# Patient Record
Sex: Male | Born: 1960 | Race: Black or African American | Hispanic: No
Health system: Southern US, Community
[De-identification: ages and names within clinical notes are randomized; demographics above are authoritative.]

## PROBLEM LIST (undated history)

## (undated) DIAGNOSIS — D509 Iron deficiency anemia, unspecified: Secondary | ICD-10-CM

## (undated) DIAGNOSIS — K8689 Other specified diseases of pancreas: Secondary | ICD-10-CM

## (undated) DIAGNOSIS — K860 Alcohol-induced chronic pancreatitis: Secondary | ICD-10-CM

## (undated) DIAGNOSIS — I10 Essential (primary) hypertension: Secondary | ICD-10-CM

## (undated) DIAGNOSIS — A048 Other specified bacterial intestinal infections: Secondary | ICD-10-CM

## (undated) DIAGNOSIS — I728 Aneurysm of other specified arteries: Principal | ICD-10-CM

## (undated) DIAGNOSIS — M25512 Pain in left shoulder: Secondary | ICD-10-CM

## (undated) DIAGNOSIS — E119 Type 2 diabetes mellitus without complications: Secondary | ICD-10-CM

## (undated) HISTORY — DX: Alcohol-induced chronic pancreatitis: K86.0

## (undated) HISTORY — DX: Iron deficiency anemia, unspecified: D50.9

## (undated) HISTORY — DX: Other specified diseases of pancreas: K86.89

## (undated) HISTORY — DX: Aneurysm of other specified arteries: I72.8

## (undated) HISTORY — DX: Other specified bacterial intestinal infections: A04.8

---

## 1983-11-05 HISTORY — PX: KNEE SURGERY: SHX244

## 2009-12-06 ENCOUNTER — Emergency Department (HOSPITAL_COMMUNITY): Admission: EM | Admit: 2009-12-06 | Discharge: 2009-12-06 | Payer: Self-pay | Admitting: Emergency Medicine

## 2014-11-04 DIAGNOSIS — I10 Essential (primary) hypertension: Secondary | ICD-10-CM | POA: Insufficient documentation

## 2014-11-04 HISTORY — DX: Essential (primary) hypertension: I10

## 2017-01-16 ENCOUNTER — Emergency Department (HOSPITAL_COMMUNITY)
Admission: EM | Admit: 2017-01-16 | Discharge: 2017-01-16 | Disposition: A | Discharge status: Home / Self Care | Payer: No Typology Code available for payment source | Attending: Emergency Medicine | Admitting: Emergency Medicine

## 2017-01-16 ENCOUNTER — Emergency Department (HOSPITAL_COMMUNITY): Payer: No Typology Code available for payment source

## 2017-01-16 ENCOUNTER — Encounter (HOSPITAL_COMMUNITY): Payer: Self-pay | Admitting: Emergency Medicine

## 2017-01-16 DIAGNOSIS — R42 Dizziness and giddiness: Secondary | ICD-10-CM | POA: Diagnosis present

## 2017-01-16 DIAGNOSIS — R002 Palpitations: Secondary | ICD-10-CM | POA: Insufficient documentation

## 2017-01-16 DIAGNOSIS — I10 Essential (primary) hypertension: Secondary | ICD-10-CM | POA: Insufficient documentation

## 2017-01-16 DIAGNOSIS — R55 Syncope and collapse: Secondary | ICD-10-CM

## 2017-01-16 HISTORY — DX: Essential (primary) hypertension: I10

## 2017-01-16 LAB — BASIC METABOLIC PANEL
Anion gap: 12 (ref 5–15)
BUN: 12 mg/dL (ref 6–20)
CHLORIDE: 105 mmol/L (ref 101–111)
CO2: 21 mmol/L — ABNORMAL LOW (ref 22–32)
CREATININE: 0.97 mg/dL (ref 0.61–1.24)
Calcium: 9 mg/dL (ref 8.9–10.3)
Glucose, Bld: 144 mg/dL — ABNORMAL HIGH (ref 65–99)
Potassium: 4 mmol/L (ref 3.5–5.1)
SODIUM: 138 mmol/L (ref 135–145)

## 2017-01-16 LAB — CBC
HCT: 42.8 % (ref 39.0–52.0)
Hemoglobin: 14.4 g/dL (ref 13.0–17.0)
MCH: 26.8 pg (ref 26.0–34.0)
MCHC: 33.6 g/dL (ref 30.0–36.0)
MCV: 79.7 fL (ref 78.0–100.0)
PLATELETS: 338 10*3/uL (ref 150–400)
RBC: 5.37 MIL/uL (ref 4.22–5.81)
RDW: 15 % (ref 11.5–15.5)
WBC: 4 10*3/uL (ref 4.0–10.5)

## 2017-01-16 LAB — URINALYSIS, ROUTINE W REFLEX MICROSCOPIC
Bilirubin Urine: NEGATIVE
GLUCOSE, UA: NEGATIVE mg/dL
Hgb urine dipstick: NEGATIVE
Ketones, ur: NEGATIVE mg/dL
LEUKOCYTES UA: NEGATIVE
NITRITE: NEGATIVE
Protein, ur: NEGATIVE mg/dL
Specific Gravity, Urine: 1.019 (ref 1.005–1.030)
pH: 6 (ref 5.0–8.0)

## 2017-01-16 LAB — I-STAT TROPONIN, ED: Troponin i, poc: 0.01 ng/mL (ref 0.00–0.08)

## 2017-01-16 LAB — CBG MONITORING, ED: GLUCOSE-CAPILLARY: 157 mg/dL — AB (ref 65–99)

## 2017-01-16 NOTE — ED Triage Notes (Addendum)
Pt reports dizziness that comes and goes for the past few months, reports PCP sent him for further evaluation. Reports hx of htn, and noncompliance with bp meds. Pt ambulatory to triage, speech clear, resp e/u, nad. No neuro deficits noted.

## 2017-01-16 NOTE — ED Notes (Signed)
cbg was 157

## 2017-01-16 NOTE — ED Notes (Signed)
Patient transported to X-ray 

## 2017-01-16 NOTE — ED Notes (Signed)
Pt ambulated to restroom, pt had no complaints and walked with a steady gait.

## 2017-01-17 NOTE — ED Provider Notes (Signed)
[facility] DEPT Provider Note   CSN: [id number] Arrival date & time: 01/16/17  3474     History   Chief Complaint Chief Complaint  Joe [name] presents with  . Dizziness    Joe ANTARIO [name] is a 56 y.o. male.  Joe Joe [name] presents to the emergency room with complaints of intermittent dizziness is been going going over the past several months.  Joe [name] feels like this associated intermittently with palpitations.  Joe [name] does have a history of hypertension has had noncompliance with Joe [name] medications.  No syncope.  No history of coronary artery disease.  Asymptomatic at this time.    Past Medical History:  Diagnosis Date  . Hypertension     There are no active problems to display for this Joe [name].   Past Surgical History:  Procedure Laterality Date  . KNEE SURGERY         Home Medications    Prior to Admission medications   Medication Sig Start Date End Date Taking? Authorizing Provider  amLODipine (NORVASC) 5 MG tablet Take 5 mg by mouth daily.   Yes Historical Provider, MD    Family History No family history on file.  Social History Social History  Substance Use Topics  . Smoking status: Never Smoker  . Smokeless tobacco: Not on file  . Alcohol use No     Allergies   Hctz [hydrochlorothiazide]   Review of Systems Review of Systems  All other systems reviewed and are negative.    Physical Exam Updated Vital Signs BP (!) 137/105   Pulse 69   Temp 97.4 F (36.3 C) (Oral)   Resp 18   SpO2 98%   Physical Exam  Constitutional: Joe [name] is oriented to person, place, and time. Joe [name] appears well-developed and well-nourished.  HENT:  Head: Normocephalic and atraumatic.  Eyes: EOM are normal.  Neck: Normal range of motion.  Cardiovascular: Normal rate, regular rhythm, normal heart sounds and intact distal pulses.   Pulmonary/Chest: Effort normal and breath sounds normal. No respiratory distress.  Abdominal: Soft. Joe [name] exhibits no distension. There is no  tenderness.  Musculoskeletal: Normal range of motion.  Neurological: Joe [name] is alert and oriented to person, place, and time.  Skin: Skin is warm and dry.  Psychiatric: Joe [name] has a normal mood and affect. Judgment normal.  Nursing note and vitals reviewed.    ED Treatments / Results  Labs (all labs ordered are listed, but only abnormal results are displayed) Labs Reviewed  BASIC METABOLIC PANEL - Abnormal; Notable for the following:       Result Value   CO2 21 (*)    Glucose, Bld 144 (*)    All other components within normal limits  CBG MONITORING, ED - Abnormal; Notable for the following:    Glucose-Capillary 157 (*)    All other components within normal limits  CBC  URINALYSIS, ROUTINE W REFLEX MICROSCOPIC  I-STAT TROPOININ, ED    EKG  EKG Interpretation  Date/Time:  Thursday January 16 2017 25:95:63 EDT Ventricular Rate:  80 PR Interval:  148 QRS Duration: 90 QT Interval:  384 QTC Calculation: 442 R Axis:   -46 Text Interpretation:  Normal sinus rhythm Biatrial enlargement Left axis deviation Septal infarct , age undetermined Abnormal ECG No old tracing to compare Confirmed by Deija Buhrman  MD, Lennette Bihari ([id number]) on 01/17/2017 3:59:50 PM       Radiology Dg Chest 2 View  Result Date: 01/16/2017 CLINICAL DATA:  Dizziness and nausea. EXAM: CHEST  2 VIEW COMPARISON:  None. FINDINGS:  Lungs are clear. Heart size and pulmonary vascularity are normal. No adenopathy. No bone lesions. IMPRESSION: No edema or consolidation. Electronically Signed   By: Lowella Grip III M.D.   On: 01/16/2017 11:18    Procedures Procedures (including critical care time)  Medications Ordered in ED Medications - No data to display   Initial Impression / Assessment and Plan / ED Course  I have reviewed the triage vital signs and the nursing notes.  Pertinent labs & imaging results that were available during my care of the Joe [name] were reviewed by me and considered in my medical decision making (see chart  for details).     Intermittent lightheadedness for several months.  Joe [name] does have associated palpitations with these.  I have contacted the [facility] and set the Joe [name] up for outpatient 72 hour Holter monitor.  Joe [name] understands return to the ER for new or worsening symptoms.  I've also schedule him an outpatient appointment with cardiology.  I do not think Joe [name] needs acute admission the hospital this time.  Joe [name] symptoms have been intermittent over the past several months.  Final Clinical Impressions(s) / ED Diagnoses   Final diagnoses:  Palpitations  Near syncope    New Prescriptions Discharge Medication List as of 01/16/2017  2:45 PM       Jola Schmidt, MD 01/17/17 [phone]

## 2017-10-10 ENCOUNTER — Encounter (HOSPITAL_COMMUNITY): Payer: Self-pay | Admitting: Emergency Medicine

## 2017-10-10 ENCOUNTER — Other Ambulatory Visit: Payer: Self-pay

## 2017-10-10 DIAGNOSIS — Z5321 Procedure and treatment not carried out due to patient leaving prior to being seen by health care provider: Secondary | ICD-10-CM | POA: Insufficient documentation

## 2017-10-10 DIAGNOSIS — Z041 Encounter for examination and observation following transport accident: Secondary | ICD-10-CM | POA: Insufficient documentation
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
## 2017-10-11 ENCOUNTER — Emergency Department (HOSPITAL_COMMUNITY)
Admission: EM | Admit: 2017-10-11 | Discharge: 2017-10-11 | Disposition: A | Discharge status: Home / Self Care | Payer: No Typology Code available for payment source | Attending: Emergency Medicine | Admitting: Emergency Medicine

## 2017-10-11 NOTE — ED Notes (Signed)
No answer in waiting area for x-ray.

## 2017-10-11 NOTE — ED Notes (Signed)
No answer in waiting area.

## 2018-02-09 ENCOUNTER — Ambulatory Visit: Payer: Self-pay | Admitting: Internal Medicine

## 2018-02-18 ENCOUNTER — Encounter: Payer: Self-pay | Admitting: Internal Medicine

## 2018-02-18 ENCOUNTER — Ambulatory Visit: Payer: Self-pay | Admitting: Internal Medicine

## 2018-02-18 VITALS — BP 138/88 | HR 68 | Resp 12 | Ht 69.0 in | Wt 160.0 lb

## 2018-02-18 DIAGNOSIS — I10 Essential (primary) hypertension: Secondary | ICD-10-CM

## 2018-02-18 DIAGNOSIS — F4321 Adjustment disorder with depressed mood: Secondary | ICD-10-CM

## 2018-02-18 MED ORDER — LOSARTAN POTASSIUM-HCTZ 100-12.5 MG PO TABS: 1.0000 | ORAL_TABLET | Freq: Every day | ORAL | 3 refills | Status: DC

## 2018-02-18 NOTE — Patient Instructions (Signed)
Can google "advance directives, [place]"  And bring up form from Secretary of State. Print and fill out Or can go to "5 wishes"  Which is also in Spanish and fill out--this costs $5--perhaps easier to use. Designate a Medical Power of Attorney to speak for you if you are unable to speak for yourself when ill or injured  
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
## 2018-02-18 NOTE — Progress Notes (Signed)
Social Work Theatre manager completed new patient screening with Joe [name] to assess for any behavioral health or resource needs. Joe [name] reported that he feels down several days, and is also experiencing restlessness, stress, and anxiety and that he was particularly stressed about coming for his appointment today. He reported feeling particularly anxious that several of his family members "have left him" in the past year. SW Intern offered information about counseling services at [facility] and encouraged Joe [name] to consider how this may be beneficial for exploring his stress, anxiety, and family situations further. Joe [name] did not report any resource needs at this time. SWI gave contact information if Joe [name] desired to set up counseling services in the future.

## 2018-03-03 ENCOUNTER — Ambulatory Visit: Payer: Self-pay | Admitting: Internal Medicine

## 2018-03-24 ENCOUNTER — Other Ambulatory Visit: Payer: Self-pay

## 2018-05-11 ENCOUNTER — Telehealth: Payer: Self-pay | Admitting: Internal Medicine

## 2018-05-11 NOTE — Telephone Encounter (Signed)
Lab appointment scheduled for 05/22/18.

## 2018-05-22 ENCOUNTER — Other Ambulatory Visit: Payer: Self-pay

## 2018-05-26 ENCOUNTER — Encounter: Payer: Self-pay | Admitting: Internal Medicine

## 2018-07-08 ENCOUNTER — Other Ambulatory Visit: Payer: Self-pay

## 2018-07-08 ENCOUNTER — Encounter: Payer: Self-pay | Admitting: Internal Medicine

## 2018-07-08 ENCOUNTER — Ambulatory Visit: Payer: Self-pay | Admitting: Internal Medicine

## 2018-07-08 VITALS — BP 140/92 | HR 74 | Resp 12 | Ht 69.0 in | Wt 166.0 lb

## 2018-07-08 DIAGNOSIS — I10 Essential (primary) hypertension: Secondary | ICD-10-CM

## 2018-07-08 DIAGNOSIS — M5442 Lumbago with sciatica, left side: Secondary | ICD-10-CM

## 2018-07-08 DIAGNOSIS — Z23 Encounter for immunization: Secondary | ICD-10-CM

## 2018-07-08 DIAGNOSIS — Z125 Encounter for screening for malignant neoplasm of prostate: Secondary | ICD-10-CM

## 2018-07-08 DIAGNOSIS — G8929 Other chronic pain: Secondary | ICD-10-CM | POA: Insufficient documentation

## 2018-07-08 DIAGNOSIS — Z79899 Other long term (current) drug therapy: Secondary | ICD-10-CM

## 2018-07-08 DIAGNOSIS — Z13220 Encounter for screening for lipoid disorders: Secondary | ICD-10-CM

## 2018-07-08 MED ORDER — CYCLOBENZAPRINE HCL 10 MG PO TABS: ORAL_TABLET | ORAL | 1 refills | Status: DC

## 2018-07-08 MED ORDER — METHYLPREDNISOLONE ACETATE 40 MG/ML IJ SUSP
80.0000 mg | Freq: Once | INTRAMUSCULAR | Status: AC
  Administered 2018-07-08: 80 mg via INTRAMUSCULAR

## 2018-07-08 NOTE — Addendum Note (Signed)
Addended bySerafina Mitchell on: 07/08/2018 04:02 PM   Modules accepted: Orders

## 2018-07-08 NOTE — Progress Notes (Signed)
   Subjective:    Patient ID: Joe [name], male    DOB: Oct 05, 1961, 57 y.o.   MRN: [id number]  HPI   Left hip and side pain for 3 1/2 months:  Was working at the time--a lot of bending and relatively mild to moderate lifting.   Went to [facility] on [address] about 2 months ago as could no longer stand the pain. He had Xrays done.  Sounds like he had xrays of hip and back.  States he was told these were fine. States he was given diagnosis of left sciatica and corticosteroid injection along with muscle relaxant and pain med. He states the treatment helped a little while and then just seemed to come and go.   In past 2 weeks, he has had difficulties with pain every day again. Pain is constant.  Describes the pain as aching.  Starts in posterior mid buttock and radiates down side of leg.  Describes radiation of pain as a soreness.   If sitting or lying down, the pain is not there--only if standing and walking.   Has more pain if walking up stairs or a hill. Going downstairs or downhill does not hurt as much. No urine or bowel incontinence.   Not clear if foot drop.  Likely not by his description. Out of medication for about 1 week.  2.  Hypertension:  Did not take medication today as had fasting labs this morning and did not know if he could drink water.  Discussed never to hold bp med unless we tell him specifically to do so.  Current Meds  Medication Sig  . losartan-hydrochlorothiazide (HYZAAR) 100-12.5 MG tablet Take 1 tablet by mouth daily.   Allergies  Allergen Reactions  . Hctz [Hydrochlorothiazide] Rash    Review of Systems    Objective:   Physical Exam NAD Significant varus angulation of knees bilaterally as walks to exam table. MS:  Mild tenderness over L/S spinous processes.  Mild tenderness over left paraspinous musculature.   +/- straight leg raise. Neuro:  Motor 5/5, DTRs 2+/4 throughout bilateral lower extremities.  Sensation to light touch normal and  equal bilaterally.       Assessment & Plan:  Left low back pain with sciatica:  80 mg IM Depo Medrol Cyclobenzaprine 10 mg 1/2 to 1 tab every 8 hours as needed. Referral to [facility]  Hypertension:  To not skip meds unless specifically told to.

## 2018-07-08 NOTE — Patient Instructions (Signed)
[facility]:  [phone]  

## 2018-07-09 LAB — COMPREHENSIVE METABOLIC PANEL
A/G RATIO: 1.3 (ref 1.2–2.2)
ALBUMIN: 3.4 g/dL — AB (ref 3.5–5.5)
ALT: 28 IU/L (ref 0–44)
AST: 42 IU/L — AB (ref 0–40)
Alkaline Phosphatase: 126 IU/L — ABNORMAL HIGH (ref 39–117)
BUN / CREAT RATIO: 11 (ref 9–20)
BUN: 9 mg/dL (ref 6–24)
Bilirubin Total: 0.8 mg/dL (ref 0.0–1.2)
CALCIUM: 8.9 mg/dL (ref 8.7–10.2)
CO2: 25 mmol/L (ref 20–29)
CREATININE: 0.79 mg/dL (ref 0.76–1.27)
Chloride: 105 mmol/L (ref 96–106)
GFR, EST AFRICAN AMERICAN: 115 mL/min/{1.73_m2} (ref >59)
GFR, EST NON AFRICAN AMERICAN: 100 mL/min/{1.73_m2} (ref >59)
GLOBULIN, TOTAL: 2.6 g/dL (ref 1.5–4.5)
Glucose: 86 mg/dL (ref 65–99)
POTASSIUM: 4.6 mmol/L (ref 3.5–5.2)
SODIUM: 144 mmol/L (ref 134–144)
TOTAL PROTEIN: 6 g/dL (ref 6.0–8.5)

## 2018-07-09 LAB — CBC WITH DIFFERENTIAL/PLATELET
BASOS: 0 %
Basophils Absolute: 0 10*3/uL (ref 0.0–0.2)
EOS (ABSOLUTE): 0.3 10*3/uL (ref 0.0–0.4)
EOS: 8 %
HEMATOCRIT: 36.4 % — AB (ref 37.5–51.0)
HEMOGLOBIN: 11.9 g/dL — AB (ref 13.0–17.7)
IMMATURE GRANULOCYTES: 0 %
Immature Grans (Abs): 0 10*3/uL (ref 0.0–0.1)
Lymphocytes Absolute: 0.9 10*3/uL (ref 0.7–3.1)
Lymphs: 25 %
MCH: 27.2 pg (ref 26.6–33.0)
MCHC: 32.7 g/dL (ref 31.5–35.7)
MCV: 83 fL (ref 79–97)
MONOS ABS: 0.4 10*3/uL (ref 0.1–0.9)
Monocytes: 11 %
NEUTROS PCT: 56 %
Neutrophils Absolute: 2 10*3/uL (ref 1.4–7.0)
Platelets: 420 10*3/uL (ref 150–450)
RBC: 4.37 x10E6/uL (ref 4.14–5.80)
RDW: 15.3 % (ref 12.3–15.4)
WBC: 3.5 10*3/uL (ref 3.4–10.8)

## 2018-07-09 LAB — LIPID PANEL W/O CHOL/HDL RATIO
Cholesterol, Total: 81 mg/dL — ABNORMAL LOW (ref 100–199)
HDL: 46 mg/dL (ref >39)
LDL Calculated: 27 mg/dL (ref 0–99)
TRIGLYCERIDES: 42 mg/dL (ref 0–149)
VLDL Cholesterol Cal: 8 mg/dL (ref 5–40)

## 2018-07-09 LAB — PSA: Prostate Specific Ag, Serum: 3.5 ng/mL (ref 0.0–4.0)

## 2018-07-27 ENCOUNTER — Encounter: Payer: Self-pay | Admitting: Internal Medicine

## 2018-10-07 ENCOUNTER — Ambulatory Visit: Payer: Self-pay | Admitting: Internal Medicine

## 2018-11-05 ENCOUNTER — Other Ambulatory Visit: Payer: Self-pay

## 2018-11-05 ENCOUNTER — Encounter (HOSPITAL_COMMUNITY): Payer: Self-pay | Admitting: Emergency Medicine

## 2018-11-05 ENCOUNTER — Inpatient Hospital Stay (HOSPITAL_COMMUNITY)
Admission: EM | Admit: 2018-11-05 | Discharge: 2018-11-14 | DRG: 356 | Disposition: A | Discharge status: Home / Self Care | Payer: Medicaid Other | Attending: Internal Medicine | Admitting: Internal Medicine

## 2018-11-05 ENCOUNTER — Other Ambulatory Visit (HOSPITAL_COMMUNITY): Payer: No Typology Code available for payment source

## 2018-11-05 ENCOUNTER — Inpatient Hospital Stay (HOSPITAL_COMMUNITY): Payer: Medicaid Other

## 2018-11-05 DIAGNOSIS — H1032 Unspecified acute conjunctivitis, left eye: Secondary | ICD-10-CM | POA: Diagnosis present

## 2018-11-05 DIAGNOSIS — I10 Essential (primary) hypertension: Secondary | ICD-10-CM | POA: Diagnosis present

## 2018-11-05 DIAGNOSIS — F1020 Alcohol dependence, uncomplicated: Secondary | ICD-10-CM | POA: Diagnosis present

## 2018-11-05 DIAGNOSIS — R188 Other ascites: Secondary | ICD-10-CM | POA: Diagnosis present

## 2018-11-05 DIAGNOSIS — R1013 Epigastric pain: Secondary | ICD-10-CM

## 2018-11-05 DIAGNOSIS — Z888 Allergy status to other drugs, medicaments and biological substances status: Secondary | ICD-10-CM | POA: Diagnosis not present

## 2018-11-05 DIAGNOSIS — K859 Acute pancreatitis without necrosis or infection, unspecified: Secondary | ICD-10-CM | POA: Diagnosis present

## 2018-11-05 DIAGNOSIS — K922 Gastrointestinal hemorrhage, unspecified: Secondary | ICD-10-CM | POA: Diagnosis present

## 2018-11-05 DIAGNOSIS — K921 Melena: Principal | ICD-10-CM | POA: Diagnosis present

## 2018-11-05 DIAGNOSIS — E440 Moderate protein-calorie malnutrition: Secondary | ICD-10-CM | POA: Diagnosis present

## 2018-11-05 DIAGNOSIS — Z8249 Family history of ischemic heart disease and other diseases of the circulatory system: Secondary | ICD-10-CM | POA: Diagnosis not present

## 2018-11-05 DIAGNOSIS — K766 Portal hypertension: Secondary | ICD-10-CM | POA: Diagnosis present

## 2018-11-05 DIAGNOSIS — K648 Other hemorrhoids: Secondary | ICD-10-CM | POA: Diagnosis present

## 2018-11-05 DIAGNOSIS — K635 Polyp of colon: Secondary | ICD-10-CM | POA: Diagnosis present

## 2018-11-05 DIAGNOSIS — Z9114 Patient's other noncompliance with medication regimen: Secondary | ICD-10-CM

## 2018-11-05 DIAGNOSIS — I729 Aneurysm of unspecified site: Secondary | ICD-10-CM

## 2018-11-05 DIAGNOSIS — K2971 Gastritis, unspecified, with bleeding: Secondary | ICD-10-CM | POA: Diagnosis present

## 2018-11-05 DIAGNOSIS — C259 Malignant neoplasm of pancreas, unspecified: Secondary | ICD-10-CM | POA: Diagnosis present

## 2018-11-05 DIAGNOSIS — K920 Hematemesis: Secondary | ICD-10-CM | POA: Diagnosis present

## 2018-11-05 DIAGNOSIS — D649 Anemia, unspecified: Secondary | ICD-10-CM

## 2018-11-05 DIAGNOSIS — K861 Other chronic pancreatitis: Secondary | ICD-10-CM | POA: Diagnosis present

## 2018-11-05 DIAGNOSIS — K2900 Acute gastritis without bleeding: Secondary | ICD-10-CM

## 2018-11-05 DIAGNOSIS — D62 Acute posthemorrhagic anemia: Secondary | ICD-10-CM | POA: Diagnosis present

## 2018-11-05 DIAGNOSIS — Z79899 Other long term (current) drug therapy: Secondary | ICD-10-CM

## 2018-11-05 DIAGNOSIS — I471 Supraventricular tachycardia: Secondary | ICD-10-CM | POA: Diagnosis present

## 2018-11-05 DIAGNOSIS — Z7289 Other problems related to lifestyle: Secondary | ICD-10-CM | POA: Diagnosis not present

## 2018-11-05 DIAGNOSIS — D509 Iron deficiency anemia, unspecified: Secondary | ICD-10-CM

## 2018-11-05 DIAGNOSIS — R109 Unspecified abdominal pain: Secondary | ICD-10-CM | POA: Diagnosis present

## 2018-11-05 LAB — COMPREHENSIVE METABOLIC PANEL
ALBUMIN: 3.3 g/dL — AB (ref 3.5–5.0)
ALT: 26 U/L (ref 0–44)
AST: 27 U/L (ref 15–41)
Alkaline Phosphatase: 53 U/L (ref 38–126)
Anion gap: 10 (ref 5–15)
BILIRUBIN TOTAL: 0.9 mg/dL (ref 0.3–1.2)
BUN: 9 mg/dL (ref 6–20)
CALCIUM: 8.1 mg/dL — AB (ref 8.9–10.3)
CO2: 21 mmol/L — AB (ref 22–32)
Chloride: 104 mmol/L (ref 98–111)
Creatinine, Ser: 0.77 mg/dL (ref 0.61–1.24)
GFR calc non Af Amer: >60 mL/min (ref >60)
GLUCOSE: 141 mg/dL — AB (ref 70–99)
POTASSIUM: 3.5 mmol/L (ref 3.5–5.1)
SODIUM: 135 mmol/L (ref 135–145)
TOTAL PROTEIN: 5.4 g/dL — AB (ref 6.5–8.1)

## 2018-11-05 LAB — PREPARE RBC (CROSSMATCH)

## 2018-11-05 LAB — CBC WITH DIFFERENTIAL/PLATELET
Abs Immature Granulocytes: 0.01 10*3/uL (ref 0.00–0.07)
BASOS ABS: 0 10*3/uL (ref 0.0–0.1)
Basophils Relative: 0 %
EOS ABS: 0 10*3/uL (ref 0.0–0.5)
EOS PCT: 0 %
HCT: 23.8 % — ABNORMAL LOW (ref 39.0–52.0)
Hemoglobin: 7 g/dL — ABNORMAL LOW (ref 13.0–17.0)
Immature Granulocytes: 0 %
Lymphocytes Relative: 9 %
Lymphs Abs: 0.5 10*3/uL — ABNORMAL LOW (ref 0.7–4.0)
MCH: 23.6 pg — AB (ref 26.0–34.0)
MCHC: 29.4 g/dL — AB (ref 30.0–36.0)
MCV: 80.4 fL (ref 80.0–100.0)
MONO ABS: 0.3 10*3/uL (ref 0.1–1.0)
Monocytes Relative: 5 %
NRBC: 0 % (ref 0.0–0.2)
Neutro Abs: 4.6 10*3/uL (ref 1.7–7.7)
Neutrophils Relative %: 86 %
Platelets: 251 10*3/uL (ref 150–400)
RBC: 2.96 MIL/uL — AB (ref 4.22–5.81)
RDW: 16.7 % — AB (ref 11.5–15.5)
WBC: 5.4 10*3/uL (ref 4.0–10.5)

## 2018-11-05 LAB — ABO/RH: ABO/RH(D): A POS

## 2018-11-05 LAB — HEMOGLOBIN: Hemoglobin: 8.6 g/dL — ABNORMAL LOW (ref 13.0–17.0)

## 2018-11-05 LAB — POC OCCULT BLOOD, ED: Fecal Occult Bld: POSITIVE — AB

## 2018-11-05 LAB — LIPASE, BLOOD: Lipase: 117 U/L — ABNORMAL HIGH (ref 11–51)

## 2018-11-05 MED ORDER — ACETAMINOPHEN 325 MG PO TABS: 650.0000 mg | ORAL_TABLET | Freq: Four times a day (QID) | ORAL | Status: DC | PRN

## 2018-11-05 MED ORDER — SODIUM CHLORIDE 0.9 % IV BOLUS
1000.0000 mL | Freq: Once | INTRAVENOUS | Status: AC
  Administered 2018-11-05: 1000 mL via INTRAVENOUS

## 2018-11-05 MED ORDER — FOLIC ACID 1 MG PO TABS
1.0000 mg | ORAL_TABLET | Freq: Every day | ORAL | Status: DC
  Administered 2018-11-05 – 2018-11-14 (×8): 1 mg via ORAL
  Filled 2018-11-05 (×9): qty 1

## 2018-11-05 MED ORDER — VITAMIN B-1 100 MG PO TABS
100.0000 mg | ORAL_TABLET | Freq: Every day | ORAL | Status: DC
  Administered 2018-11-05 – 2018-11-14 (×8): 100 mg via ORAL
  Filled 2018-11-05 (×9): qty 1

## 2018-11-05 MED ORDER — PANTOPRAZOLE SODIUM 40 MG IV SOLR
40.0000 mg | Freq: Once | INTRAVENOUS | Status: AC
  Administered 2018-11-05: 40 mg via INTRAVENOUS
  Filled 2018-11-05: qty 40

## 2018-11-05 MED ORDER — ONDANSETRON HCL 4 MG/2ML IJ SOLN: 4.0000 mg | Freq: Four times a day (QID) | INTRAMUSCULAR | Status: DC | PRN

## 2018-11-05 MED ORDER — HYDROCODONE-ACETAMINOPHEN 5-325 MG PO TABS
1.0000 | ORAL_TABLET | ORAL | Status: DC | PRN
  Administered 2018-11-05 – 2018-11-11 (×17): 2 via ORAL
  Administered 2018-11-11: 1 via ORAL
  Administered 2018-11-11 – 2018-11-13 (×7): 2 via ORAL
  Administered 2018-11-13: 1 via ORAL
  Administered 2018-11-13 – 2018-11-14 (×3): 2 via ORAL
  Filled 2018-11-05 (×19): qty 2
  Filled 2018-11-05: qty 1
  Filled 2018-11-05: qty 2
  Filled 2018-11-05: qty 1
  Filled 2018-11-05 (×9): qty 2

## 2018-11-05 MED ORDER — THIAMINE HCL 100 MG/ML IJ SOLN
100.0000 mg | Freq: Every day | INTRAMUSCULAR | Status: DC
  Administered 2018-11-05: 100 mg via INTRAVENOUS
  Filled 2018-11-05: qty 2

## 2018-11-05 MED ORDER — ACETAMINOPHEN 650 MG RE SUPP: 650.0000 mg | Freq: Four times a day (QID) | RECTAL | Status: DC | PRN

## 2018-11-05 MED ORDER — PANTOPRAZOLE SODIUM 40 MG IV SOLR
40.0000 mg | Freq: Two times a day (BID) | INTRAVENOUS | Status: DC
  Administered 2018-11-05 – 2018-11-12 (×16): 40 mg via INTRAVENOUS
  Filled 2018-11-05 (×17): qty 40

## 2018-11-05 MED ORDER — ONDANSETRON HCL 4 MG/2ML IJ SOLN
4.0000 mg | Freq: Once | INTRAMUSCULAR | Status: AC
  Administered 2018-11-05: 4 mg via INTRAVENOUS
  Filled 2018-11-05: qty 2

## 2018-11-05 MED ORDER — SODIUM CHLORIDE 0.9 % IV SOLN
INTRAVENOUS | Status: DC
  Administered 2018-11-05 – 2018-11-12 (×9): via INTRAVENOUS

## 2018-11-05 MED ORDER — HYDRALAZINE HCL 20 MG/ML IJ SOLN: 2.0000 mg | INTRAMUSCULAR | Status: DC | PRN

## 2018-11-05 MED ORDER — HYDROMORPHONE HCL 1 MG/ML IJ SOLN
1.0000 mg | Freq: Once | INTRAMUSCULAR | Status: AC
  Administered 2018-11-05: 1 mg via INTRAVENOUS
  Filled 2018-11-05: qty 1

## 2018-11-05 MED ORDER — MORPHINE SULFATE (PF) 2 MG/ML IV SOLN
1.0000 mg | INTRAVENOUS | Status: DC | PRN
  Administered 2018-11-05: 1 mg via INTRAVENOUS
  Filled 2018-11-05: qty 1

## 2018-11-05 MED ORDER — CYCLOBENZAPRINE HCL 5 MG PO TABS: 5.0000 mg | ORAL_TABLET | Freq: Three times a day (TID) | ORAL | Status: DC | PRN

## 2018-11-05 MED ORDER — SODIUM CHLORIDE 0.9% IV SOLUTION
Freq: Once | INTRAVENOUS | Status: AC
  Administered 2018-11-05: 13:00:00 via INTRAVENOUS

## 2018-11-05 MED ORDER — ONDANSETRON HCL 4 MG PO TABS: 4.0000 mg | ORAL_TABLET | Freq: Four times a day (QID) | ORAL | Status: DC | PRN

## 2018-11-05 MED ORDER — BOOST / RESOURCE BREEZE PO LIQD CUSTOM
1.0000 | Freq: Three times a day (TID) | ORAL | Status: DC
  Administered 2018-11-07 – 2018-11-10 (×8): 1 via ORAL

## 2018-11-05 NOTE — ED Triage Notes (Signed)
Patient c/o mid epigastric pain onset of Monday. Patient reports bloody emesis. Patient adds he is out of BP medications.

## 2018-11-05 NOTE — Progress Notes (Addendum)
Called MD to clarify blood transfusion order. Dr. Laren Everts advised do not give second unit of blood unless Hgb is less than 7.

## 2018-11-05 NOTE — H&P (Addendum)
[facility]                                                                                    Patient Demographics  Joe [name], is a 58 y.o. male  CSN: [id number]  MRN: [id number]  DOB - [birth year]  Admit Date - 11/05/2018  Outpatient Primary MD for the patient is Mack Hook, MD   With History of -  Past Medical History:  Diagnosis Date  . Hypertension 2016      Past Surgical History:  Procedure Laterality Date  . KNEE SURGERY Bilateral 1985   Arthroscopic for torn cartilage and ligaments.      in for   Chief Complaint  Patient presents with  . Abdominal Pain     HPI  Joe [name]  is a 57 y.o. male, with past medical history significant for hypertension presenting with the epigastric pain for the last 3 days with bloody emesis and melena.  No history of GI bleed in the past.  Patient denies history of alcoholism but reports he drinks 1 beer a day maximum.  He takes nonsteroidals over-the-counter.  The abdominal discomfort radiates to the back. In the emergency room his hemoglobin was noted to be 7 down from 11.9.  He was started on IV Protonix and GI Dr. Ronalee Red was consulted. No chest pains , shortness of breath, dizziness or loss of consciousness .    Review of Systems    In addition to the HPI above,  No Fever-chills, No Headache, No changes with Vision or hearing, No problems swallowing food or Liquids, No Chest pain, Cough or Shortness of Breath, No Blood in stool or Urine, No dysuria, No new skin rashes or bruises, No new joints pains-aches,  No new weakness, tingling, numbness in any extremity, No recent weight gain or loss, No polyuria, polydypsia or polyphagia, No significant Mental Stressors.  A full 10 point Review of Systems was done, except as stated above, all other Review of Systems were negative.   Social History Social History   Tobacco Use  . Smoking status: Never Smoker  . Smokeless tobacco: Never Used   Substance Use Topics  . Alcohol use: Yes    Comment: 44 oz daily     Family History Family History  Problem Relation Age of Onset  . Cancer Mother        uncertain what was wrong--mother would not share, but gradual decline.  . Hypertension Mother   . Hypertension Father      Prior to Admission medications   Medication Sig Start Date End Date Taking? Authorizing Provider  cyclobenzaprine (FLEXERIL) 10 MG tablet 1/2 to 1 tab by mouth every 8 hours as needed for muscle spasm 07/08/18  Yes Mack Hook, MD  losartan-hydrochlorothiazide (HYZAAR) 100-12.5 MG tablet Take 1 tablet by mouth daily. 02/18/18  Yes Mack Hook, MD    Allergies  Allergen Reactions  . Hctz [Hydrochlorothiazide] Rash    Physical Exam  Vitals  Blood pressure (!) 142/78, pulse 61, resp. rate 16, height 5\' 11"  (1.803 m), weight 73.9 kg, SpO2 100 %.   1. General Young male, extremely pleasant, no acute distress  2.  Normal affect and insight, Not Suicidal or Homicidal, Awake Alert, Oriented X 3.  3. No F.N deficits, grossly, patient moving all extremities.  4. Ears and Eyes appear Normal, Conjunctivae clear, PERRLA. Moist Oral Mucosa.  5. Supple Neck, No JVD, No cervical lymphadenopathy appriciated, No Carotid Bruits.  6. Symmetrical Chest wall movement, Good air movement bilaterally, CTAB.  7. RRR, No Gallops, Rubs or Murmurs, No Parasternal Heave.  8. Positive Bowel Sounds, Abdomen Soft, mild epigastric tenderness,   9.  No Cyanosis, Normal Skin Turgor, No Skin Rash or Bruise.  10. Good muscle tone,  joints appear normal , no effusions, Normal ROM.    Data Review  CBC Recent Labs  Lab 11/05/18 1024  WBC 5.4  HGB 7.0*  HCT 23.8*  PLT 251  MCV 80.4  MCH 23.6*  MCHC 29.4*  RDW 16.7*  LYMPHSABS 0.5*  MONOABS 0.3  EOSABS 0.0  BASOSABS 0.0   ------------------------------------------------------------------------------------------------------------------  Chemistries   Recent Labs  Lab 11/05/18 1024  NA 135  K 3.5  CL 104  CO2 21*  GLUCOSE 141*  BUN 9  CREATININE 0.77  CALCIUM 8.1*  AST 27  ALT 26  ALKPHOS 53  BILITOT 0.9   ------------------------------------------------------------------------------------------------------------------ estimated creatinine clearance is 106.5 mL/min (by C-G formula based on SCr of 0.77 mg/dL). ------------------------------------------------------------------------------------------------------------------ No results for input(s): TSH, T4TOTAL, T3FREE, THYROIDAB in the last 72 hours.  Invalid input(s): FREET3   Coagulation profile No results for input(s): INR, PROTIME in the last 168 hours. ------------------------------------------------------------------------------------------------------------------- No results for input(s): DDIMER in the last 72 hours. -------------------------------------------------------------------------------------------------------------------  Cardiac Enzymes No results for input(s): CKMB, TROPONINI, MYOGLOBIN in the last 168 hours.  Invalid input(s): CK ------------------------------------------------------------------------------------------------------------------ Invalid input(s): POCBNP   ---------------------------------------------------------------------------------------------------------------  Urinalysis    Component Value Date/Time   COLORURINE YELLOW 01/16/2017 0942   APPEARANCEUR CLEAR 01/16/2017 0942   LABSPEC 1.019 01/16/2017 0942   PHURINE 6.0 01/16/2017 0942   GLUCOSEU NEGATIVE 01/16/2017 0942   HGBUR NEGATIVE 01/16/2017 0942   BILIRUBINUR NEGATIVE 01/16/2017 0942   KETONESUR NEGATIVE 01/16/2017 0942   PROTEINUR NEGATIVE 01/16/2017 0942   NITRITE NEGATIVE 01/16/2017 0942   LEUKOCYTESUR NEGATIVE 01/16/2017 0942    ----------------------------------------------------------------------------------------------------------------   Imaging  results:   No results found.    Assessment & Plan  GI bleed; melena Protonix IV GI on consult Clear liquid diet/n.p.o. after midnight  Pancreatitis?  Alcohol consumption Check abdominal ultrasound Patient drinks around 1 beer a day and no history of withdrawals  Anemia Monitor hemoglobin and transfuse if less than 7  ?  Alcoholism Thiamine/folate No history of withdrawal  DVT Prophylaxis SCDs  AM Labs Ordered, also please review Full Orders  Family Communication: Admission, patients condition and plan of care including tests being ordered have been discussed with the patient and fianc who indicate understanding and agree with the plan and Code Status.  Code Status full  Disposition Plan: Home  Time spent in minutes : 38 minutes  Condition GUARDED   @SIGNATURE @

## 2018-11-05 NOTE — ED Notes (Signed)
Patient transported to Ultrasound 

## 2018-11-05 NOTE — H&P (View-Only) (Signed)
Reason for Consult: GI bleed Referring Physician: [facility]  Joe [name] HPI: This is a 58 year old male who presents to the ER with complaints of epigastric pain and melena.  He denies any prior history of a GI bleed.  Recently he was taking NSAIDs and his HGB was noted to have dropped from 11.9 g/dL down to 7.0 g/dL.  For the past three weeks he was taking NSAIDs to help control his epigastric pain, but on Sunday his pain markedly worsened.  The pain then increased in severity last evening and this prompted him to present to the ER.  He vomited twice yesterday and then he vomited a third time with some hematemesis.  Last night was the first time that he noticed melenic stools.  The patient does drink a 12 pack of beer per week.  Past Medical History:  Diagnosis Date  . Hypertension 2016    Past Surgical History:  Procedure Laterality Date  . KNEE SURGERY Bilateral 1985   Arthroscopic for torn cartilage and ligaments.      Family History  Problem Relation Age of Onset  . Cancer Mother        uncertain what was wrong--mother would not share, but gradual decline.  . Hypertension Mother   . Hypertension Father     Social History:  reports that he has never smoked. He has never used smokeless tobacco. He reports current alcohol use. He reports that he does not use drugs.  Allergies:  Allergies  Allergen Reactions  . Hctz [Hydrochlorothiazide] Rash    Medications:  Scheduled: . folic acid  1 mg Oral Daily  . pantoprazole (PROTONIX) IV  40 mg Intravenous Q12H  . thiamine injection  100 mg Intravenous Daily   Continuous: . sodium chloride 50 mL/hr at 11/05/18 1323    Results for orders placed or performed during the hospital encounter of 11/05/18 (from the past 24 hour(s))  POC occult blood, ED     Status: Abnormal   Collection Time: 11/05/18 10:12 AM  Result Value Ref Range   Fecal Occult Bld POSITIVE (A) NEGATIVE  CBC with Differential     Status: Abnormal   Collection Time: 11/05/18 10:24 AM  Result Value Ref Range   WBC 5.4 4.0 - 10.5 K/uL   RBC 2.96 (L) 4.22 - 5.81 MIL/uL   Hemoglobin 7.0 (L) 13.0 - 17.0 g/dL   HCT 23.8 (L) 39.0 - 52.0 %   MCV 80.4 80.0 - 100.0 fL   MCH 23.6 (L) 26.0 - 34.0 pg   MCHC 29.4 (L) 30.0 - 36.0 g/dL   RDW 16.7 (H) 11.5 - 15.5 %   Platelets 251 150 - 400 K/uL   nRBC 0.0 0.0 - 0.2 %   Neutrophils Relative % 86 %   Neutro Abs 4.6 1.7 - 7.7 K/uL   Lymphocytes Relative 9 %   Lymphs Abs 0.5 (L) 0.7 - 4.0 K/uL   Monocytes Relative 5 %   Monocytes Absolute 0.3 0.1 - 1.0 K/uL   Eosinophils Relative 0 %   Eosinophils Absolute 0.0 0.0 - 0.5 K/uL   Basophils Relative 0 %   Basophils Absolute 0.0 0.0 - 0.1 K/uL   Immature Granulocytes 0 %   Abs Immature Granulocytes 0.01 0.00 - 0.07 K/uL  Comprehensive metabolic panel     Status: Abnormal   Collection Time: 11/05/18 10:24 AM  Result Value Ref Range   Sodium 135 135 - 145 mmol/L   Potassium 3.5 3.5 - 5.1 mmol/L  Chloride 104 98 - 111 mmol/L   CO2 21 (L) 22 - 32 mmol/L   Glucose, Bld 141 (H) 70 - 99 mg/dL   BUN 9 6 - 20 mg/dL   Creatinine, Ser 0.77 0.61 - 1.24 mg/dL   Calcium 8.1 (L) 8.9 - 10.3 mg/dL   Total Protein 5.4 (L) 6.5 - 8.1 g/dL   Albumin 3.3 (L) 3.5 - 5.0 g/dL   AST 27 15 - 41 U/L   ALT 26 0 - 44 U/L   Alkaline Phosphatase 53 38 - 126 U/L   Total Bilirubin 0.9 0.3 - 1.2 mg/dL   GFR calc non Af Amer >60 >60 mL/min   GFR calc Af Amer >60 >60 mL/min   Anion gap 10 5 - 15  Lipase, blood     Status: Abnormal   Collection Time: 11/05/18 10:24 AM  Result Value Ref Range   Lipase 117 (H) 11 - 51 U/L  Type and screen [place]     Status: None (Preliminary result)   Collection Time: 11/05/18 10:24 AM  Result Value Ref Range   ABO/RH([name]) A POS    Antibody Screen NEG    Sample Expiration      11/08/2018 Performed at [facility], [place] [address] [place], [place] [address]    Unit Number [id number]    Blood Component Type  RED CELLS,LR    Unit division 00    Status of Unit ALLOCATED    Transfusion Status OK TO TRANSFUSE    Crossmatch Result Compatible    Unit Number [id number]    Blood Component Type RED CELLS,LR    Unit division 00    Status of Unit ALLOCATED    Transfusion Status OK TO TRANSFUSE    Crossmatch Result Compatible   ABO/Rh     Status: None   Collection Time: 11/05/18 10:24 AM  Result Value Ref Range   ABO/RH([name])      A POS Performed at [facility], [place] [address], [place], [place] [address]   Prepare RBC     Status: None   Collection Time: 11/05/18 12:49 PM  Result Value Ref Range   Order Confirmation      ORDER PROCESSED BY BLOOD BANK Performed at [facility], [place] [address], [place], [place] [address]      No results found.  ROS:  As stated above in the HPI otherwise negative.  Blood pressure 134/89, pulse 62, temperature 98 F (36.7 C), resp. rate 13, height 5\' 11"  (1.803 m), weight 73.9 kg, SpO2 100 %.    PE: Gen: NAD, Alert and Oriented HEENT:  [place]/AT, EOMI Neck: Supple, no LAD Lungs: CTA Bilaterally CV: RRR without M/G/R ABM: Soft, epigastric tenderness, +BS Ext: No C/C/E  Assessment/Plan: 1) Upper GI bleed. 2) Epigastric pain.  Plan: 1) EGD tomorrow.  Joe [name] 11/05/2018, 1:29 PM

## 2018-11-05 NOTE — Consult Note (Signed)
Reason for Consult: GI bleed Referring Physician: [facility]  [name] HPI: This is a 58 year old male who presents to the ER with complaints of epigastric pain and melena.  He denies any prior history of a GI bleed.  Recently he was taking NSAIDs and his HGB was noted to have dropped from 11.9 g/dL down to 7.0 g/dL.  For the past three weeks he was taking NSAIDs to help control his epigastric pain, but on Sunday his pain markedly worsened.  The pain then increased in severity last evening and this prompted him to present to the ER.  He vomited twice yesterday and then he vomited a third time with some hematemesis.  Last night was the first time that he noticed melenic stools.  The patient does drink a 12 pack of beer per week.  Past Medical History:  Diagnosis Date  . Hypertension 2016    Past Surgical History:  Procedure Laterality Date  . KNEE SURGERY Bilateral 1985   Arthroscopic for torn cartilage and ligaments.      Family History  Problem Relation Age of Onset  . Cancer Mother        uncertain what was wrong--mother would not share, but gradual decline.  . Hypertension Mother   . Hypertension Father     Social History:  reports that he has never smoked. He has never used smokeless tobacco. He reports current alcohol use. He reports that he does not use drugs.  Allergies:  Allergies  Allergen Reactions  . Hctz [Hydrochlorothiazide] Rash    Medications:  Scheduled: . folic acid  1 mg Oral Daily  . pantoprazole (PROTONIX) IV  40 mg Intravenous Q12H  . thiamine injection  100 mg Intravenous Daily   Continuous: . sodium chloride 50 mL/hr at 11/05/18 1323    Results for orders placed or performed during the hospital encounter of 11/05/18 (from the past 24 hour(s))  POC occult blood, ED     Status: Abnormal   Collection Time: 11/05/18 10:12 AM  Result Value Ref Range   Fecal Occult Bld POSITIVE (A) NEGATIVE  CBC with Differential     Status: Abnormal   Collection Time: 11/05/18 10:24 AM  Result Value Ref Range   WBC 5.4 4.0 - 10.5 K/uL   RBC 2.96 (L) 4.22 - 5.81 MIL/uL   Hemoglobin 7.0 (L) 13.0 - 17.0 g/dL   HCT 23.8 (L) 39.0 - 52.0 %   MCV 80.4 80.0 - 100.0 fL   MCH 23.6 (L) 26.0 - 34.0 pg   MCHC 29.4 (L) 30.0 - 36.0 g/dL   RDW 16.7 (H) 11.5 - 15.5 %   Platelets 251 150 - 400 K/uL   nRBC 0.0 0.0 - 0.2 %   Neutrophils Relative % 86 %   Neutro Abs 4.6 1.7 - 7.7 K/uL   Lymphocytes Relative 9 %   Lymphs Abs 0.5 (L) 0.7 - 4.0 K/uL   Monocytes Relative 5 %   Monocytes Absolute 0.3 0.1 - 1.0 K/uL   Eosinophils Relative 0 %   Eosinophils Absolute 0.0 0.0 - 0.5 K/uL   Basophils Relative 0 %   Basophils Absolute 0.0 0.0 - 0.1 K/uL   Immature Granulocytes 0 %   Abs Immature Granulocytes 0.01 0.00 - 0.07 K/uL  Comprehensive metabolic panel     Status: Abnormal   Collection Time: 11/05/18 10:24 AM  Result Value Ref Range   Sodium 135 135 - 145 mmol/L   Potassium 3.5 3.5 - 5.1 mmol/L  Chloride 104 98 - 111 mmol/L   CO2 21 (L) 22 - 32 mmol/L   Glucose, Bld 141 (H) 70 - 99 mg/dL   BUN 9 6 - 20 mg/dL   Creatinine, Ser 0.77 0.61 - 1.24 mg/dL   Calcium 8.1 (L) 8.9 - 10.3 mg/dL   Total Protein 5.4 (L) 6.5 - 8.1 g/dL   Albumin 3.3 (L) 3.5 - 5.0 g/dL   AST 27 15 - 41 U/L   ALT 26 0 - 44 U/L   Alkaline Phosphatase 53 38 - 126 U/L   Total Bilirubin 0.9 0.3 - 1.2 mg/dL   GFR calc non Af Amer >60 >60 mL/min   GFR calc Af Amer >60 >60 mL/min   Anion gap 10 5 - 15  Lipase, blood     Status: Abnormal   Collection Time: 11/05/18 10:24 AM  Result Value Ref Range   Lipase 117 (H) 11 - 51 U/L  Type and screen [place]     Status: None (Preliminary result)   Collection Time: 11/05/18 10:24 AM  Result Value Ref Range   ABO/RH(D) A POS    Antibody Screen NEG    Sample Expiration      11/08/2018 Performed at [facility], [place] [address], [place], [place] [address]    Unit Number [id number]    Blood Component Type  RED CELLS,LR    Unit division 00    Status of Unit ALLOCATED    Transfusion Status OK TO TRANSFUSE    Crossmatch Result Compatible    Unit Number [id number]    Blood Component Type RED CELLS,LR    Unit division 00    Status of Unit ALLOCATED    Transfusion Status OK TO TRANSFUSE    Crossmatch Result Compatible   ABO/Rh     Status: None   Collection Time: 11/05/18 10:24 AM  Result Value Ref Range   ABO/RH(D)      A POS Performed at [facility], [place] [address], [place], [place] [address]   Prepare RBC     Status: None   Collection Time: 11/05/18 12:49 PM  Result Value Ref Range   Order Confirmation      ORDER PROCESSED BY BLOOD BANK Performed at [facility], [place] [address], [place], [place] [address]      No results found.  ROS:  As stated above in the HPI otherwise negative.  Blood pressure 134/89, pulse 62, temperature 98 F (36.7 C), resp. rate 13, height 5\' 11"  (1.803 m), weight 73.9 kg, SpO2 100 %.    PE: Gen: NAD, Alert and Oriented HEENT:  [place]/AT, EOMI Neck: Supple, no LAD Lungs: CTA Bilaterally CV: RRR without M/G/R ABM: Soft, epigastric tenderness, +BS Ext: No C/C/E  Assessment/Plan: 1) Upper GI bleed. 2) Epigastric pain.  Plan: 1) EGD tomorrow.  Jeniyah Menor D 11/05/2018, 1:29 PM

## 2018-11-05 NOTE — ED Provider Notes (Addendum)
[facility] EMERGENCY DEPARTMENT Provider Note   CSN: [id number] Arrival date & time: 11/05/18  3875     History   Chief Complaint Chief Complaint  Patient presents with  . Abdominal Pain    HPI Joe [name] is a 58 y.o. male.  Epigastric pain the past 3 days with bloody emesis and black stool.  No previous history of gastritis, peptic ulcer disease, pancreatitis.  He has hypertension but has been noncompliant with his medication.  He drinks a modest amount of alcohol.  No cigarettes.  Takes Aleve.  Severity of pain is moderate to severe.  Nothing makes symptoms better or worse.  Decreased oral intake.  Hemoglobin 11.9 on 07/08/2014     Past Medical History:  Diagnosis Date  . Hypertension 2016    Patient Active Problem List   Diagnosis Date Noted  . 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 Hypertension 11/04/2014    Past Surgical History:  Procedure Laterality Date  . KNEE SURGERY Bilateral 1985   Arthroscopic for torn cartilage and ligaments.          Home Medications    Prior to Admission medications   Medication Sig Start Date End Date Taking? Authorizing Provider  cyclobenzaprine (FLEXERIL) 10 MG tablet 1/2 to 1 tab by mouth every 8 hours as needed for muscle spasm 07/08/18  Yes Mack Hook, MD  losartan-hydrochlorothiazide (HYZAAR) 100-12.5 MG tablet Take 1 tablet by mouth daily. 02/18/18  Yes Mack Hook, MD    Family History Family History  Problem Relation Age of Onset  . Cancer Mother        uncertain what was wrong--mother would not share, but gradual decline.  . Hypertension Mother   . Hypertension Father     Social History Social History   Tobacco Use  . Smoking status: Never Smoker  . Smokeless tobacco: Never Used  Substance Use Topics  . Alcohol use: Yes    Comment: 44 oz daily  . Drug use: No     Allergies   Hctz [hydrochlorothiazide]   Review of Systems Review  of Systems  All other systems reviewed and are negative.    Physical Exam Updated Vital Signs BP (!) 142/78   Pulse 61   Resp 16   Ht 5\' 11"  (1.803 m)   Wt 73.9 kg   SpO2 100%   BMI 22.73 kg/m   Physical Exam Vitals signs and nursing note reviewed.  Constitutional:      Appearance: He is well-developed.     Comments: In pain  HENT:     Head: Normocephalic and atraumatic.  Eyes:     Conjunctiva/sclera: Conjunctivae normal.  Neck:     Musculoskeletal: Neck supple.  Cardiovascular:     Rate and Rhythm: Normal rate and regular rhythm.  Pulmonary:     Effort: Pulmonary effort is normal.     Breath sounds: Normal breath sounds.  Abdominal:     General: Bowel sounds are normal.     Palpations: Abdomen is soft.     Comments: Tender epigastrium.  Musculoskeletal: Normal range of motion.  Skin:    General: Skin is warm and dry.  Neurological:     Mental Status: He is alert and oriented to person, place, and time.  Psychiatric:        Behavior: Behavior normal.      ED Treatments / Results  Labs (all labs ordered are listed, but only abnormal results are displayed) Labs  Reviewed  CBC WITH DIFFERENTIAL/PLATELET - Abnormal; Notable for the following components:      Result Value   RBC 2.96 (*)    Hemoglobin 7.0 (*)    HCT 23.8 (*)    MCH 23.6 (*)    MCHC 29.4 (*)    RDW 16.7 (*)    Lymphs Abs 0.5 (*)    All other components within normal limits  COMPREHENSIVE METABOLIC PANEL - Abnormal; Notable for the following components:   CO2 21 (*)    Glucose, Bld 141 (*)    Calcium 8.1 (*)    Total Protein 5.4 (*)    Albumin 3.3 (*)    All other components within normal limits  LIPASE, BLOOD - Abnormal; Notable for the following components:   Lipase 117 (*)    All other components within normal limits  POC OCCULT BLOOD, ED - Abnormal; Notable for the following components:   Fecal Occult Bld POSITIVE (*)    All other components within normal limits  OCCULT BLOOD X 1  CARD TO LAB, STOOL  TYPE AND SCREEN  ABO/RH    EKG None  Radiology No results found.  Procedures Procedures (including critical care time)  Medications Ordered in ED Medications  sodium chloride 0.9 % bolus 1,000 mL (1,000 mLs Intravenous New Bag/Given 11/05/18 1027)  ondansetron (ZOFRAN) injection 4 mg (4 mg Intravenous Given 11/05/18 1027)  pantoprazole (PROTONIX) injection 40 mg (40 mg Intravenous Given 11/05/18 1028)  HYDROmorphone (DILAUDID) injection 1 mg (1 mg Intravenous Given 11/05/18 1028)     Initial Impression / Assessment and Plan / ED Course  I have reviewed the triage vital signs and the nursing notes.  Pertinent labs & imaging results that were available during my care of the patient were reviewed by me and considered in my medical decision making (see chart for details).     History and physical most consistent with Joe [name] gastritis.  IV fluids, IV Protonix, IV Zofran, IV Dilaudid, labs.  1115: Hemoccult positive.  Hemoglobin 7.0.  Will consult gastroenterology and admit to general medicine.  CRITICAL CARE Performed by: Nat Christen Total critical care time: 40 minutes Critical care time was exclusive of separately billable procedures and treating other patients. Critical care was necessary to treat or prevent imminent or life-threatening deterioration. Critical care was time spent personally by me on the following activities: development of treatment plan with patient and/or surrogate as well as nursing, discussions with consultants, evaluation of patient's response to treatment, examination of patient, obtaining history from patient or surrogate, ordering and performing treatments and interventions, ordering and review of laboratory studies, ordering and review of radiographic studies, pulse oximetry and re-evaluation of patient's condition.  Final Clinical Impressions(s) / ED Diagnoses   Final diagnoses:  Epigastric pain  Acute gastritis, presence of  bleeding unspecified, unspecified gastritis type  UGIB (upper gastrointestinal bleed)  Anemia, unspecified type  Acute pancreatitis, unspecified complication status, unspecified pancreatitis type    ED Discharge Orders    None       Nat Christen, MD 11/05/18 1006    Nat Christen, MD 11/05/18 1125    Nat Christen, MD 11/05/18 1132    Nat Christen, MD 11/05/18 1215

## 2018-11-06 ENCOUNTER — Encounter (HOSPITAL_COMMUNITY): Payer: Self-pay | Admitting: *Deleted

## 2018-11-06 ENCOUNTER — Inpatient Hospital Stay (HOSPITAL_COMMUNITY): Payer: Medicaid Other | Admitting: Anesthesiology

## 2018-11-06 ENCOUNTER — Encounter (HOSPITAL_COMMUNITY)
Admission: EM | Disposition: A | Discharge status: Home / Self Care | Payer: Self-pay | Source: Home / Self Care | Attending: Family Medicine

## 2018-11-06 DIAGNOSIS — R1013 Epigastric pain: Secondary | ICD-10-CM

## 2018-11-06 HISTORY — PX: EUS: SHX5427

## 2018-11-06 HISTORY — PX: ESOPHAGOGASTRODUODENOSCOPY (EGD) WITH PROPOFOL: SHX5813

## 2018-11-06 LAB — HEMOGLOBIN
Hemoglobin: 7.4 g/dL — ABNORMAL LOW (ref 13.0–17.0)
Hemoglobin: 7.2 g/dL — ABNORMAL LOW (ref 13.0–17.0)
Hemoglobin: 8 g/dL — ABNORMAL LOW (ref 13.0–17.0)

## 2018-11-06 LAB — PREPARE RBC (CROSSMATCH)

## 2018-11-06 LAB — LIPASE, BLOOD: Lipase: 73 U/L — ABNORMAL HIGH (ref 11–51)

## 2018-11-06 LAB — HIV ANTIBODY (ROUTINE TESTING W REFLEX): HIV Screen 4th Generation wRfx: NONREACTIVE

## 2018-11-06 SURGERY — ESOPHAGOGASTRODUODENOSCOPY (EGD) WITH PROPOFOL
Anesthesia: Monitor Anesthesia Care

## 2018-11-06 MED ORDER — PROPOFOL 500 MG/50ML IV EMUL
INTRAVENOUS | Status: DC | PRN
  Administered 2018-11-06: 100 ug/kg/min via INTRAVENOUS

## 2018-11-06 MED ORDER — SODIUM CHLORIDE 0.9 % IV SOLN: INTRAVENOUS | Status: DC

## 2018-11-06 MED ORDER — ONDANSETRON HCL 4 MG/2ML IJ SOLN: 4.0000 mg | Freq: Once | INTRAMUSCULAR | Status: DC | PRN

## 2018-11-06 MED ORDER — PEG 3350-KCL-NA BICARB-NACL 420 G PO SOLR
4000.0000 mL | Freq: Once | ORAL | Status: AC
  Administered 2018-11-06: 4000 mL via ORAL
  Filled 2018-11-06: qty 4000

## 2018-11-06 MED ORDER — DEXMEDETOMIDINE HCL 200 MCG/2ML IV SOLN
INTRAVENOUS | Status: DC | PRN
  Administered 2018-11-06 (×2): 12 ug via INTRAVENOUS
  Administered 2018-11-06 (×2): 8 ug via INTRAVENOUS

## 2018-11-06 MED ORDER — PROPOFOL 10 MG/ML IV BOLUS
INTRAVENOUS | Status: DC | PRN
  Administered 2018-11-06: 20 mg via INTRAVENOUS
  Administered 2018-11-06: 15 mg via INTRAVENOUS
  Administered 2018-11-06: 25 mg via INTRAVENOUS

## 2018-11-06 MED ORDER — LACTATED RINGERS IV SOLN
INTRAVENOUS | Status: DC | PRN
  Administered 2018-11-06: 12:00:00 via INTRAVENOUS

## 2018-11-06 MED ORDER — SODIUM CHLORIDE 0.9% IV SOLUTION: Freq: Once | INTRAVENOUS | Status: AC

## 2018-11-06 MED ORDER — FENTANYL CITRATE (PF) 100 MCG/2ML IJ SOLN: 25.0000 ug | INTRAMUSCULAR | Status: DC | PRN

## 2018-11-06 MED ORDER — LIDOCAINE HCL (CARDIAC) PF 100 MG/5ML IV SOSY
PREFILLED_SYRINGE | INTRAVENOUS | Status: DC | PRN
  Administered 2018-11-06: 100 mg via INTRAVENOUS

## 2018-11-06 MED ORDER — MORPHINE SULFATE (PF) 4 MG/ML IV SOLN
4.0000 mg | INTRAVENOUS | Status: DC | PRN
  Administered 2018-11-06 – 2018-11-09 (×12): 4 mg via INTRAVENOUS
  Filled 2018-11-06 (×12): qty 1

## 2018-11-06 SURGICAL SUPPLY — 15 items

## 2018-11-06 NOTE — Interval H&P Note (Signed)
History and Physical Interval Note:  11/06/2018 11:45 AM  Joe [name]  has presented today for surgery, with the diagnosis of Melena and anemia  The various methods of treatment have been discussed with the patient and family. After consideration of risks, benefits and other options for treatment, the patient has consented to  Procedure(s): ESOPHAGOGASTRODUODENOSCOPY (EGD) WITH PROPOFOL (N/A) as a surgical intervention .  The patient's history has been reviewed, patient examined, no change in status, stable for surgery.  I have reviewed the patient's chart and labs.  Questions were answered to the patient's satisfaction.     Zai Chmiel D

## 2018-11-06 NOTE — Progress Notes (Signed)
PROGRESS NOTE  Joe [name] UXN:[id number] DOB: June 10, 1961 DOA: 11/05/2018 PCP: Mack Hook, MD   LOS: 1 day   Brief Narrative / Interim history: 58 year old with history only of hypertension came into the hospital on 1/2 with epigastric pain for the last 3 days along with bloody emesis and melena, no prior history of GI bleed in the past.  He reports drinking 1-2 beers a day and taking Aleve over the last few days due to abdominal pain.  His abdominal pain radiates into the back.  He was found to be anemic in the ED with a hemoglobin of 7 down from 11.9, was started on IV Protonix and gastroenterology was consulted.  Subjective: Continues to complain of epigastric abdominal pain radiating into his back.  No chest pain, no shortness of breath, no nausea or vomiting.  Assessment & Plan: Active Problems:   GI bleed   Principal Problem GI bleed with acute blood loss anemia -Gastroenterology consulted, status post EGD today which did not show any clear evidence of a source for bleed.  He is for colonoscopy tomorrow -Has received 1 unit of packed red blood cells, hemoglobin improved to 8.6 however trending down again at 7.2 today.  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  He underwent an EUS today which showed pancreatic head mass, this will need to be further characterized with an MRI of the abdomen following his colonoscopy  Acute pancreatitis -Improving, lipase is trending down, continue to monitor, symptomatic treatment, pain control  Hypertension -Hold home medications, blood pressure stable  Alcohol use -Does not appear to be withdrawing   Scheduled Meds: . feeding supplement  1 Container Oral TID BM  . folic acid  1 mg Oral Daily  . pantoprazole (PROTONIX) IV  40 mg Intravenous Q12H  . polyethylene  glycol-electrolytes  4,000 mL Oral Once  . thiamine  100 mg Oral Daily   Continuous Infusions: . sodium chloride 50 mL/hr at 11/06/18 1421  . sodium chloride     PRN Meds:.acetaminophen **OR** acetaminophen, cyclobenzaprine, hydrALAZINE, HYDROcodone-acetaminophen, morphine injection, ondansetron **OR** ondansetron (ZOFRAN) IV  DVT prophylaxis: SCDs Code Status: Full code Family Communication: No family at bedside Disposition Plan: Home when cleared by GI  Consultants:   Gastroenterology  Procedures:  EGD Findings:      The esophagus was normal.      The stomach was normal.      The examined duodenum was normal. Impression:               - Normal esophagus.                           - Normal stomach.                           - Normal examined duodenum.                           - No specimens collected. Recommendation:           - Perform an upper endoscopic ultrasound (UEUS) Now.  EUS Impression:               - Ascites was found on endosonographic examination  of the peritoneal cavity.                           - A mass was identified in the pancreatic head.                           - There was dilation in the middle third of the                            main bile duct and in the upper third of the main                            bile duct which measured up to 13 mm.                           - There was no sign of significant pathology in the                            gallbladder.                           - No specimens collected. Recommendation:           - MRI of the pancreas after the colonoscopy.  Antimicrobials:  None    Objective: Vitals:   11/06/18 1253 11/06/18 1303 11/06/18 1313 11/06/18 1334  BP: 123/81 133/83 (!) 142/94 (!) 147/80  Pulse: 65 (!) 59 (!) 59 (!) 58  Resp: 15 13 13 16   Temp:    97.6 F (36.4 C)  TempSrc:    Oral  SpO2: 100% 100% 100% 100%  Weight:      Height:        Intake/Output Summary (Last 24  hours) at 11/06/2018 1451 Last data filed at 11/06/2018 1238 Gross per 24 hour  Intake 1898.21 ml  Output -  Net 1898.21 ml   Filed Weights   11/05/18 0958 11/05/18 1540  Weight: 73.9 kg 72 kg    Examination:  Constitutional: NAD Eyes: PERRL, lids and conjunctivae normal ENMT: Mucous membranes are moist.  Neck: normal, supple Respiratory: clear to auscultation bilaterally, no wheezing, no crackles. Normal respiratory effort. No accessory muscle use.  Cardiovascular: Regular rate and rhythm, no murmurs / rubs / gallops.  Abdomen: Tender to palpation in the epigastric area, no guarding or rebound Musculoskeletal: no clubbing / cyanosis.  Skin: no rashes Neurologic: No focal deficits Psychiatric: Normal judgment and insight. Alert and oriented x 3. Normal mood.    Data Reviewed: I have independently reviewed following labs and imaging studies   CBC: Recent Labs  Lab 11/05/18 1024 11/05/18 1813 11/06/18 0236 11/06/18 1356  WBC 5.4  --   --   --   NEUTROABS 4.6  --   --   --   HGB 7.0* 8.6* 7.4* 7.2*  HCT 23.8*  --   --   --   MCV 80.4  --   --   --   PLT 251  --   --   --    Basic Metabolic Panel: Recent Labs  Lab 11/05/18 1024  NA 135  K 3.5  CL 104  CO2 21*  GLUCOSE 141*  BUN 9  CREATININE 0.77  CALCIUM 8.1*   GFR: Estimated Creatinine Clearance: 103.8 mL/min (by C-G formula based on SCr of 0.77 mg/dL). Liver Function Tests: Recent Labs  Lab 11/05/18 1024  AST 27  ALT 26  ALKPHOS 53  BILITOT 0.9  PROT 5.4*  ALBUMIN 3.3*   Recent Labs  Lab 11/05/18 1024 11/06/18 0236  LIPASE 117* 73*   No results for input(s): AMMONIA in the last 168 hours. Coagulation Profile: No results for input(s): INR, PROTIME in the last 168 hours. Cardiac Enzymes: No results for input(s): CKTOTAL, CKMB, CKMBINDEX, TROPONINI in the last 168 hours. BNP (last 3 results) No results for input(s): PROBNP in the last 8760 hours. HbA1C: No results for input(s): HGBA1C in the  last 72 hours. CBG: No results for input(s): GLUCAP in the last 168 hours. Lipid Profile: No results for input(s): CHOL, HDL, LDLCALC, TRIG, CHOLHDL, LDLDIRECT in the last 72 hours. Thyroid Function Tests: No results for input(s): TSH, T4TOTAL, FREET4, T3FREE, THYROIDAB in the last 72 hours. Anemia Panel: No results for input(s): VITAMINB12, FOLATE, FERRITIN, TIBC, IRON, RETICCTPCT in the last 72 hours. Urine analysis:    Component Value Date/Time   COLORURINE YELLOW 01/16/2017 [place] 01/16/2017 0942   LABSPEC 1.019 01/16/2017 0942   PHURINE 6.0 01/16/2017 0942   GLUCOSEU NEGATIVE 01/16/2017 0942   HGBUR NEGATIVE 01/16/2017 0942   BILIRUBINUR NEGATIVE 01/16/2017 0942   KETONESUR NEGATIVE 01/16/2017 0942   PROTEINUR NEGATIVE 01/16/2017 0942   NITRITE NEGATIVE 01/16/2017 0942   LEUKOCYTESUR NEGATIVE 01/16/2017 0942   Sepsis Labs: Invalid input(s): PROCALCITONIN, LACTICIDVEN  No results found for this or any previous visit (from the past 240 hour(s)).    Radiology Studies: US Abdomen Complete  Result Date: 11/05/2018 CLINICAL DATA:  Pancreatitis. EXAM: ABDOMEN ULTRASOUND COMPLETE COMPARISON:  None. FINDINGS: Gallbladder: No gallstones or wall thickening visualized. No sonographic Murphy sign noted by sonographer. Common bile duct: Diameter: Dilated to 12-13 mm maximum. No visualized duct stone. Liver: Increased echogenicity with a coarsened overall heterogeneous echotexture. No discrete mass focal lesion. Portal vein is patent on color Doppler imaging with normal direction of blood flow towards the liver. IVC: No abnormality visualized. 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 Mass measures 4.2 x 3.2 x 3.5 cm. Spleen: Size and appearance within normal limits. Right Kidney: Length: 10.8 cm. Normal parenchymal echogenicity. No mass, stone or hydronephrosis. Trace perinephric fluid. Left Kidney: Length: 11.4 cm.  Normal parenchymal echogenicity. No mass, stone or hydronephrosis. Trace perinephric fluid. Abdominal aorta: No aneurysm visualized. Other findings: None. IMPRESSION: 1. Apparent pancreatic or peripancreatic pseudoaneurysm as well as a 4.2 cm solid and cystic mass projecting at the pancreatic head. This may be an inflammatory mass given the reported history of pancreatitis. Follow-up of these findings is recommended with pancreatic MRI with and without contrast, or pancreatic protocol CT if the patient cannot tolerate MRI. 2. Appearance of the liver is consistent with hepatic steatosis. Consider a component of cirrhosis given the coarsened, heterogeneous echotexture. No liver mass or focal lesion. 3. No other acute abnormality or significant finding. Electronically Signed   By: Lajean Manes M.D.   On: 11/05/2018 15:09     Marzetta Board, MD, PhD [facility] Pager [phone]  If 7PM-7AM, please contact night-coverage [email or web address] Password [email or web address] 11/06/2018, 2:51 PM

## 2018-11-06 NOTE — Op Note (Signed)
[facility] Patient Name: Joe [name] Procedure Date : 11/06/2018 MRN: [id number] Attending MD: Carol Ada , MD Date of Birth: 1960-12-09 CSN: [id number] Age: 58 Admit Type: Inpatient Procedure:                Upper GI endoscopy Indications:              Acute post hemorrhagic anemia, Heme positive stool,                            Melena Providers:                Carol Ada, MD, Angus Seller, Duy Alfonse Spruce,                            Technician, [place], Technician, [place].                            Beckner, CRNA Referring MD:              Medicines:                Propofol per Anesthesia Complications:            No immediate complications. Estimated Blood Loss:     Estimated blood loss: none. Procedure:                Pre-Anesthesia Assessment:                           - Prior to the procedure, a History and Physical                            was performed, and patient medications and                            allergies were reviewed. The patient's tolerance of                            previous anesthesia was also reviewed. The risks                            and benefits of the procedure and the sedation                            options and risks were discussed with the patient.                            All questions were answered, and informed consent                            was obtained. Prior Anticoagulants: The patient has                            taken no previous anticoagulant or antiplatelet                            agents. ASA Grade Assessment:  II - A patient with                            mild systemic disease. After reviewing the risks                            and benefits, the patient was deemed in                            satisfactory condition to undergo the procedure.                           - Sedation was administered by an anesthesia                            professional. Deep sedation was attained.              After obtaining informed consent, the endoscope was                            passed under direct vision. Throughout the                            procedure, the patient's blood pressure, pulse, and                            oxygen saturations were monitored continuously. The                            [id number]-[id number] ([id number]) Olympus gastroscope was                            introduced through the mouth, and advanced to the                            third part of duodenum. The upper GI endoscopy was                            accomplished without difficulty. The patient                            tolerated the procedure well. Scope In: Scope Out: Findings:      The esophagus was normal.      The stomach was normal.      The examined duodenum was normal. Impression:               - Normal esophagus.                           - Normal stomach.                           - Normal examined duodenum.                           - No specimens collected. Recommendation:           -  Perform an upper endoscopic ultrasound (UEUS) Now. Procedure Code(s):        --- Professional ---                           [phone], Esophagogastroduodenoscopy, flexible,                            transoral; diagnostic, including collection of                            specimen(s) by brushing or washing, when performed                            (separate procedure) Diagnosis Code(s):        --- Professional ---                           D62, Acute posthemorrhagic anemia                           R19.5, Other fecal abnormalities                           K92.1, Melena (includes Hematochezia) CPT copyright 2018 American Medical Association. All rights reserved. The codes documented in this report are preliminary and upon coder review may  be revised to meet current compliance requirements. Carol Ada, MD Carol Ada, MD 11/06/2018 12:46:17 PM This report has been signed electronically. Number of  Addenda: 0

## 2018-11-06 NOTE — Anesthesia Preprocedure Evaluation (Addendum)
Anesthesia Evaluation  Patient identified by MRN, date of birth, ID band Patient awake    Reviewed: Allergy & Precautions, NPO status , Patient's Chart, lab work & pertinent test results  Airway Mallampati: II  TM Distance: >3 FB Neck ROM: Full    Dental  (+) Teeth Intact, Dental Advisory Given, Missing, Chipped,    Pulmonary neg pulmonary ROS,    Pulmonary exam normal breath sounds clear to auscultation       Cardiovascular hypertension, Pt. on medications (-) CAD, (-) Past MI and (-) CHF Normal cardiovascular exam Rhythm:Regular Rate:Normal     Neuro/Psych negative neurological ROS     GI/Hepatic Neg liver ROS, Melena   Endo/Other  negative endocrine ROS  Renal/GU negative Renal ROS     Musculoskeletal negative musculoskeletal ROS (+)   Abdominal   Peds  Hematology  (+) Blood dyscrasia, anemia ,   Anesthesia Other Findings Day of surgery medications reviewed with the patient.  Reproductive/Obstetrics                            Anesthesia Physical Anesthesia Plan  ASA: II  Anesthesia Plan: MAC   Post-op Pain Management:    Induction: Intravenous  PONV Risk Score and Plan: 1 and Propofol infusion and Treatment may vary due to age or medical condition  Airway Management Planned: Nasal Cannula and Natural Airway  Additional Equipment:   Intra-op Plan:   Post-operative Plan:   Informed Consent: I have reviewed the patients History and Physical, chart, labs and discussed the procedure including the risks, benefits and alternatives for the proposed anesthesia with the patient or authorized representative who has indicated his/her understanding and acceptance.   Dental advisory given  Plan Discussed with: CRNA and Anesthesiologist  Anesthesia Plan Comments:        Anesthesia Quick Evaluation

## 2018-11-06 NOTE — Transfer of Care (Signed)
Immediate Anesthesia Transfer of Care Note  Patient: Joe [name]  Procedure(s) Performed: ESOPHAGOGASTRODUODENOSCOPY (EGD) WITH PROPOFOL (N/A ) UPPER ENDOSCOPIC ULTRASOUND (EUS) LINEAR  Patient Location: Endoscopy Unit  Anesthesia Type:MAC  Level of Consciousness: awake, alert  and oriented  Airway & Oxygen Therapy: Patient Spontanous Breathing and Patient connected to nasal cannula oxygen  Post-op Assessment: Report given to RN, Post -op Vital signs reviewed and stable and Patient moving all extremities X 4  Post vital signs: Reviewed and stable  Last Vitals:  Vitals Value Taken Time  BP    Temp    Pulse    Resp    SpO2      Last Pain:  Vitals:   11/06/18 1124  TempSrc: Oral  PainSc: 0-No pain         Complications: No apparent anesthesia complications

## 2018-11-06 NOTE — Op Note (Signed)
[facility] Patient Name: Joe [name] Procedure Date : 11/06/2018 MRN: [id number] Attending MD: Carol Ada , MD Date of Birth: April 25, 1961 CSN: [id number] Age: 58 Admit Type: Inpatient Procedure:                Upper EUS Indications:              Abnormal ultrasound of the abdomen, For evaluation                            of pancreatic adenocarcinoma, Staging of pancreatic                            adenocarcinoma Providers:                Carol Ada, MD, Angus Seller, Janeece Agee,                            Technician, [facility], Technician, Tamala Fothergill, CRNA Referring MD:              Medicines:                Propofol per Anesthesia Complications:            No immediate complications. Estimated blood loss:                            None. Estimated Blood Loss:     Estimated blood loss: none. Procedure:                Pre-Anesthesia Assessment:                           - Prior to the procedure, a History and Physical                            was performed, and patient medications and                            allergies were reviewed. The patient's tolerance of                            previous anesthesia was also reviewed. The risks                            and benefits of the procedure and the sedation                            options and risks were discussed with the patient.                            All questions were answered, and informed consent                            was obtained. Prior Anticoagulants: The patient has  taken no previous anticoagulant or antiplatelet                            agents. ASA Grade Assessment: II - A patient with                            mild systemic disease. After reviewing the risks                            and benefits, the patient was deemed in                            satisfactory condition to undergo the procedure.                            - Sedation was administered by an anesthesia                            professional. Deep sedation was attained.                           After obtaining informed consent, the endoscope was                            passed under direct vision. Throughout the                            procedure, the patient's blood pressure, pulse, and                            oxygen saturations were monitored continuously. The                            patient tolerated the procedure well. The [id number]                            ([id number]) Olympus Linear EUS scope was introduced                            through the mouth, and advanced to the second part                            of duodenum. The upper EUS was technically                            difficult and complex. Scope In: Scope Out: Findings:      ENDOSONOGRAPHIC FINDING: :      A small amount of fluid, visualized as an anechoic structure, was found       in the subhepatic peritoneal space.      A round mass was identified in the pancreatic head. The mass was       hypoechoic. The mass measured 35 mm by 35 mm in maximal cross-sectional       diameter. The endosonographic borders were well-defined.  There was dilation in the middle third of the main bile duct and in the       upper third of the main bile duct which measured up to 13 mm.      There was no sign of significant endosonographic abnormality in the       gallbladder. An unremarkable gallbladder was identified.      This procedure was difficult to perform as there was marked distortion       of the pancreatic anatomy. The large pancreatic head cystic/solid lesion       was identified, but there was also evidence of a pseudoaneurysm near       this lesion. There was no attempt to FNA this lesion during this       procedure. The CBD was dilated, as described above, but there was no       evidence of any stones or sludge. A small amount of ascites was noted in       the  inferior portion of the liver. Impression:               - Ascites was found on endosonographic examination                            of the peritoneal cavity.                           - A mass was identified in the pancreatic head.                           - There was dilation in the middle third of the                            main bile duct and in the upper third of the main                            bile duct which measured up to 13 mm.                           - There was no sign of significant pathology in the                            gallbladder.                           - No specimens collected. Recommendation:           - MRI of the pancreas after the colonoscopy.                           - Pain control. Procedure Code(s):        --- Professional ---                           [phone], Esophagogastroduodenoscopy, flexible,                            transoral; with endoscopic ultrasound examination  limited to the esophagus, stomach or duodenum, and                            adjacent structures Diagnosis Code(s):        --- Professional ---                           K86.89, Other specified diseases of pancreas                           R18.8, Other ascites                           C25.9, Malignant neoplasm of pancreas, unspecified                           K83.8, Other specified diseases of biliary tract                           R93.5, Abnormal findings on diagnostic imaging of                            other abdominal regions, including retroperitoneum CPT copyright 2018 American Medical Association. All rights reserved. The codes documented in this report are preliminary and upon coder review may  be revised to meet current compliance requirements. Carol Ada, MD Carol Ada, MD 11/06/2018 1:00:36 PM This report has been signed electronically. Number of Addenda: 0

## 2018-11-06 NOTE — Anesthesia Procedure Notes (Signed)
Procedure Name: MAC Date/Time: 11/06/2018 11:56 AM Performed by: Mariea Clonts, CRNA Pre-anesthesia Checklist: Patient identified, Emergency Drugs available, Suction available, Timeout performed and Patient being monitored Patient Re-evaluated:Patient Re-evaluated prior to induction

## 2018-11-06 NOTE — Progress Notes (Signed)
Initial Nutrition Assessment  DOCUMENTATION CODES:   Not applicable  INTERVENTION:   - Advance diet as soon as appropriate - Continue Boost Breeze TID   NUTRITION DIAGNOSIS:   Predicted suboptimal nutrient intake related to inability to eat as evidenced by NPO status.  GOAL:   Patient will meet greater than or equal to 90% of their needs  MONITOR:   Diet advancement, PO intake, Supplement acceptance, Labs, Weight trends  REASON FOR ASSESSMENT:   Malnutrition Screening Tool    ASSESSMENT:   58 yo male, admitted for surgery for GI bleed. PMH significant for HTN. No h/o GI bleed.  Labs: glucose 141, lipase 73 (H), tProtein 5.4, Hgb 7.4, Hct 23.8% Meds: Boost Breeze TID, folvite 1 mg daily, Protonix injection 40 mg q 12 hours, thiamine 100 mg daily, NS 50 mL/hr  Attempted to see pt 2 separate times, both times pt not in room.  Per chart, wt variable within 3 kg over last 8 months.  Current diet is NPO.   Will need full assessment upon follow-up.   NUTRITION - FOCUSED PHYSICAL EXAM: Will need at follow-up  Diet Order:   Diet Order            Diet NPO time specified  Diet effective now              EDUCATION NEEDS:  No education needs have been identified at this time  Skin:  Skin Assessment: Reviewed RN Assessment  Last BM:  1/2  Height:  Ht Readings from Last 1 Encounters:  11/05/18 5\' 11"  (1.803 m)    Weight:  Wt Readings from Last 1 Encounters:  11/05/18 72 kg    Ideal Body Weight:  78.2 kg  BMI:  Body mass index is 22.13 kg/m.  Estimated Nutritional Needs:   Kcal:  2160-2520 calories daily (30-35 kcal/kg ABW)  Protein:  94-117 gm daily (1.2-1.5 g/kg IBW)  Fluid:  >/= 2.1 L daily or per MD discretion  Althea Grimmer, MS, RDN, LDN Pager: [phone] Available Mondays and Fridays, 9am-2pm

## 2018-11-07 DIAGNOSIS — K922 Gastrointestinal hemorrhage, unspecified: Secondary | ICD-10-CM

## 2018-11-07 LAB — COMPREHENSIVE METABOLIC PANEL
ALT: 19 U/L (ref 0–44)
AST: 17 U/L (ref 15–41)
Albumin: 2.5 g/dL — ABNORMAL LOW (ref 3.5–5.0)
Alkaline Phosphatase: 45 U/L (ref 38–126)
Anion gap: 6 (ref 5–15)
BUN: 7 mg/dL (ref 6–20)
CO2: 23 mmol/L (ref 22–32)
Calcium: 7.9 mg/dL — ABNORMAL LOW (ref 8.9–10.3)
Chloride: 105 mmol/L (ref 98–111)
Creatinine, Ser: 0.7 mg/dL (ref 0.61–1.24)
GFR calc Af Amer: >60 mL/min (ref >60)
GFR calc non Af Amer: >60 mL/min (ref >60)
Glucose, Bld: 91 mg/dL (ref 70–99)
Potassium: 3.6 mmol/L (ref 3.5–5.1)
Sodium: 134 mmol/L — ABNORMAL LOW (ref 135–145)
Total Bilirubin: 1.1 mg/dL (ref 0.3–1.2)
Total Protein: 4.7 g/dL — ABNORMAL LOW (ref 6.5–8.1)

## 2018-11-07 LAB — BPAM RBC
Blood Product Expiration Date: 202001282359
Blood Product Expiration Date: 202001282359
ISSUE DATE / TIME: 202001021336
ISSUE DATE / TIME: 202001031638
Unit Type and Rh: 6200
Unit Type and Rh: 6200

## 2018-11-07 LAB — CBC
HCT: 25.3 % — ABNORMAL LOW (ref 39.0–52.0)
HEMOGLOBIN: 8.1 g/dL — AB (ref 13.0–17.0)
MCH: 25.4 pg — ABNORMAL LOW (ref 26.0–34.0)
MCHC: 32 g/dL (ref 30.0–36.0)
MCV: 79.3 fL — ABNORMAL LOW (ref 80.0–100.0)
Platelets: 221 10*3/uL (ref 150–400)
RBC: 3.19 MIL/uL — ABNORMAL LOW (ref 4.22–5.81)
RDW: 15.9 % — ABNORMAL HIGH (ref 11.5–15.5)
WBC: 5 10*3/uL (ref 4.0–10.5)
nRBC: 0 % (ref 0.0–0.2)

## 2018-11-07 LAB — TYPE AND SCREEN
ABO/RH(D): A POS
Antibody Screen: NEGATIVE
Unit division: 0
Unit division: 0

## 2018-11-07 LAB — HEMOGLOBIN
Hemoglobin: 8.4 g/dL — ABNORMAL LOW (ref 13.0–17.0)
Hemoglobin: 8 g/dL — ABNORMAL LOW (ref 13.0–17.0)

## 2018-11-07 MED ORDER — PEG-KCL-NACL-NASULF-NA ASC-C 100 G PO SOLR
1.0000 | Freq: Once | ORAL | Status: AC
  Administered 2018-11-07: 200 g via ORAL
  Filled 2018-11-07 (×2): qty 1

## 2018-11-07 MED ORDER — LORAZEPAM 2 MG/ML IJ SOLN: 1.0000 mg | Freq: Four times a day (QID) | INTRAMUSCULAR | Status: AC | PRN

## 2018-11-07 MED ORDER — ADULT MULTIVITAMIN W/MINERALS CH
1.0000 | ORAL_TABLET | Freq: Every day | ORAL | Status: DC
  Administered 2018-11-08 – 2018-11-14 (×7): 1 via ORAL
  Filled 2018-11-07 (×7): qty 1

## 2018-11-07 MED ORDER — LORAZEPAM 1 MG PO TABS: 1.0000 mg | ORAL_TABLET | Freq: Four times a day (QID) | ORAL | Status: AC | PRN

## 2018-11-07 NOTE — Care Management Note (Signed)
Case Management Note  Patient Details  Name: Joe [name] MRN: [id number] Date of Birth: 1961-07-05  Subjective/Objective: 58 yo with hx of HTN, but has been noncompliant with his medication. Admitted with epigastric pain and bloody emesis and melena. He reports drinking 1-2 beers a day. He was anemic in the ED with a hemoglobin of 7. Abdominal U/S with pancreatic head solid and cystic mass. EGD did not show any clear evidence of a source for bleed. He is scheduled for a colonoscopy today.      Action/Plan: Referral received to assit with meds   Expected Discharge Date:                  Expected Discharge Plan:  Home/Self Care  In-House Referral:     Discharge planning Services     Post Acute Care Choice:    Choice offered to:     DME Arranged:    DME Agency:     HH Arranged:    HH Agency:     Status of Service:  In process, will continue to follow  If discussed at Long Length of Stay Meetings, dates discussed:    Additional Comments: Met with pt. He reports that he has a PCP through the [facility]. He doesn't remember the name of the PCP. Encouraged pt to f/u with his PCP once he is D/C from the hospital. He reports that he has not been taking his BP med because he can't afford it. Pt is scheduled for a colonoscopy today. Informed pt that we will f/u and assist with meds if prn.  Norina Buzzard, RN 11/07/2018, 9:39 AM

## 2018-11-07 NOTE — Social Work (Signed)
CSW acknowledging consult for access to medications at discharge.  For medication access please consult RN Case Management. They are aware of pt need and will f/u with pt post procedure.  CSW signing off. Please consult if any additional needs arise.  Westley Hummer, MSW, [place] Work [phone]

## 2018-11-07 NOTE — H&P (View-Only) (Signed)
[facility] ROUNDING NOTE   Subjective: EGD and EUS completed yesterday by Dr. Benson Norway.  EGD was essentially normal.  EUS demonstrated a 35 x 35 mm mass in the head of the pancreas with a 13 mm CBD.  FNA was not performed due to difficult anatomy/positioning.  Otherwise, no acute events overnight.  Was given bowel preparation in anticipation of colonoscopy today for further evaluation of melena and anemia.  However no BM with bowel prep so this was put on hold until tomorrow.  He endorses ongoing MEG discomfort which is relatively unchanged from admission.   Objective: Vital signs in last 24 hours: Temp:  [97.8 F (36.6 C)-98.3 F (36.8 C)] 98.2 F (36.8 C) (01/04 1433) Pulse Rate:  [67-72] 72 (01/04 1433) Resp:  [14-16] 16 (01/04 1433) BP: (130-144)/(82-93) 130/82 (01/04 1433) SpO2:  [100 %] 100 % (01/04 1433) Last BM Date: 11/07/18 General: NAD Lungs: CTA bilaterally, no wheezes, rales, rhonchi Heart: RRR, no murmurs, rubs, gallops Abdomen: Soft, mild tenderness to palpation in MEG, no rebound or guarding, nondistended, positive bowel sounds Ext: No clubbing, cyanosis, edema    Intake/Output from previous day: 01/03 0701 - 01/04 0700 In: 2400.3 [I.V.:2085.3; Blood:315] Out: -  Intake/Output this shift: No intake/output data recorded.   Lab Results: Recent Labs    11/05/18 1024  11/06/18 2206 11/07/18 0437 11/07/18 1230  WBC 5.4  --   --  5.0  --   HGB 7.0*   < > 8.0* 8.1* 8.4*  PLT 251  --   --  221  --   MCV 80.4  --   --  79.3*  --    < > = values in this interval not displayed.   BMET Recent Labs    11/05/18 1024 11/07/18 0437  NA 135 134*  K 3.5 3.6  CL 104 105  CO2 21* 23  GLUCOSE 141* 91  BUN 9 7  CREATININE 0.77 0.70  CALCIUM 8.1* 7.9*   LFT Recent Labs    11/05/18 1024 11/07/18 0437  PROT 5.4* 4.7*  ALBUMIN 3.3* 2.5*  AST 27 17  ALT 26 19  ALKPHOS 53 45  BILITOT 0.9 1.1   PT/INR No results for input(s): INR in the last 72  hours.    Imaging/Other results: No results found.    Assessment   58 year old male admitted with MEG pain, melena in the setting of NSAID use (was actually taking for the MEG pain).  Admission hemoglobin of 7 from baseline of 11.9.  Transfused 1 unit with increased 8.6 but with subsequent decrease to 7.2 requiring another unit.  EGD completed on 11/06/2018 which was essentially normal.  Admission abdominal ultrasound notable for a HOP mass and CBD dilation, prompting EUS at the time of EGD.  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  Unfortunately due to difficult anatomy, positioning, FNA was not attempted.  1) Anemia, melena: -EGD unrevealing for mucosal/luminal etiology as noted above -Unfortunately, no BM with bowel prep overnight. -Plan for repeat bowel prep this evening with colonoscopy tomorrow - Resume H/H checks with transfusions per protocol - Clears okay today as tolerated with n.p.o. at midnight  2) Pancreatitis Mild pancreatitis on admission with lipase of 117.  Clinically improving.  Likely tumor related. - Ordering MRI pancreas to differentiate true mass from focal pancreatitis. - Depending on MRI findings, may need to consider repeat EUS with FNA.     The indications, risks, and benefits of  colonoscopy were explained to the patient in detail. Risks include but are not limited to bleeding, perforation, adverse reaction to medications, and cardiopulmonary compromise. Sequelae include but are not limited to the possibility of surgery, hospitalization, and mortality. The patient verbalized understanding and wished to proceed. All questions answered. Bowel prep re-ordered. Further recommendations pending results of the exam.      Lavena Bullion, DO  11/07/2018, 4:53 PM [facility] Pager [phone]

## 2018-11-07 NOTE — Consult Note (Signed)
[name] ROUNDING NOTE   Subjective: EGD and EUS completed yesterday by Dr. Benson Norway.  EGD was essentially normal.  EUS demonstrated a 35 x 35 mm mass in the head of the pancreas with a 13 mm CBD.  FNA was not performed due to difficult anatomy/positioning.  Otherwise, no acute events overnight.  Was given bowel preparation in anticipation of colonoscopy today for further evaluation of melena and anemia.  However no BM with bowel prep so this was put on hold until tomorrow.  He endorses ongoing MEG discomfort which is relatively unchanged from admission.   Objective: Vital signs in last 24 hours: Temp:  [97.8 F (36.6 C)-98.3 F (36.8 C)] 98.2 F (36.8 C) (01/04 1433) Pulse Rate:  [67-72] 72 (01/04 1433) Resp:  [14-16] 16 (01/04 1433) BP: (130-144)/(82-93) 130/82 (01/04 1433) SpO2:  [100 %] 100 % (01/04 1433) Last BM Date: 11/07/18 General: NAD Lungs: CTA bilaterally, no wheezes, rales, rhonchi Heart: RRR, no murmurs, rubs, gallops Abdomen: Soft, mild tenderness to palpation in MEG, no rebound or guarding, nondistended, positive bowel sounds Ext: No clubbing, cyanosis, edema    Intake/Output from previous day: 01/03 0701 - 01/04 0700 In: 2400.3 [I.V.:2085.3; Blood:315] Out: -  Intake/Output this shift: No intake/output data recorded.   Lab Results: Recent Labs    11/05/18 1024  11/06/18 2206 11/07/18 0437 11/07/18 1230  WBC 5.4  --   --  5.0  --   HGB 7.0*   < > 8.0* 8.1* 8.4*  PLT 251  --   --  221  --   MCV 80.4  --   --  79.3*  --    < > = values in this interval not displayed.   BMET Recent Labs    11/05/18 1024 11/07/18 0437  NA 135 134*  K 3.5 3.6  CL 104 105  CO2 21* 23  GLUCOSE 141* 91  BUN 9 7  CREATININE 0.77 0.70  CALCIUM 8.1* 7.9*   LFT Recent Labs    11/05/18 1024 11/07/18 0437  PROT 5.4* 4.7*  ALBUMIN 3.3* 2.5*  AST 27 17  ALT 26 19  ALKPHOS 53 45  BILITOT 0.9 1.1   PT/INR No results for input(s): INR in the last 72  hours.    Imaging/Other results: No results found.    Assessment   58 year old male admitted with MEG pain, melena in the setting of NSAID use (was actually taking for the MEG pain).  Admission hemoglobin of 7 from baseline of 11.9.  Transfused 1 unit with increased 8.6 but with subsequent decrease to 7.2 requiring another unit.  EGD completed on 11/06/2018 which was essentially normal.  Admission abdominal ultrasound notable for a HOP mass and CBD dilation, prompting EUS at the time of EGD.  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  Unfortunately due to difficult anatomy, positioning, FNA was not attempted.  1) Anemia, melena: -EGD unrevealing for mucosal/luminal etiology as noted above -Unfortunately, no BM with bowel prep overnight. -Plan for repeat bowel prep this evening with colonoscopy tomorrow - Resume H/H checks with transfusions per protocol - Clears okay today as tolerated with n.p.o. at midnight  2) Pancreatitis Mild pancreatitis on admission with lipase of 117.  Clinically improving.  Likely tumor related. - Ordering MRI pancreas to differentiate true mass from focal pancreatitis. - Depending on MRI findings, may need to consider repeat EUS with FNA.     The indications, risks, and benefits of  colonoscopy were explained to the patient in detail. Risks include but are not limited to bleeding, perforation, adverse reaction to medications, and cardiopulmonary compromise. Sequelae include but are not limited to the possibility of surgery, hospitalization, and mortality. The patient verbalized understanding and wished to proceed. All questions answered. Bowel prep re-ordered. Further recommendations pending results of the exam.      Lavena Bullion, DO  11/07/2018, 4:53 PM [facility] Pager [phone]

## 2018-11-07 NOTE — Progress Notes (Signed)
PROGRESS NOTE  Joe [name] ZOX:[id number] DOB: [birth year] DOA: 11/05/2018 PCP: Mack Hook, MD   LOS: 2 days   Brief Narrative / Interim history: 58 year old with history only of hypertension came into the hospital on 1/2 with epigastric pain for the last 3 days along with bloody emesis and melena, no prior history of GI bleed in the past.  He reports drinking 1-2 beers a day and taking Aleve over the last few days due to abdominal pain.  His abdominal pain radiates into the back.  He was found to be anemic in the ED with a hemoglobin of 7 down from 11.9, was started on IV Protonix and gastroenterology was consulted, for colonoscopy on 11/08/2018  Subjective: Tolerating GoLYTELY GI prep well, some blood with the stools, wife at bedside, questions answered, no emesis,   Assessment & Plan: Active Problems:   GI bleed   Principal Problem GI bleed with acute blood loss anemia -Gastroenterology consulted, status post EGD1/3/20 which did not show any clear evidence of a source for bleed.  He is for colonoscopy on 11/08/18 -Has received 2 units of packed red blood cells, hemoglobin above 8, continue IV Protonix   Additional Problems Pancreatic mass -Abdominal ultrasound with pancreatic head solid and cystic mass. He underwent an EUS today which showed pancreatic head mass, this will need to be further characterized with an MRI of the abdomen following his colonoscopy  Acute pancreatitis -Improving, lipase is trending down, continue to monitor, symptomatic treatment, pain control  Hypertension -Hold home medications, blood pressure stable  Alcohol use -No evidence of DTs at this time, lorazepam per CIWA protocol, thiamine and folic acid as ordered  Scheduled Meds: . feeding supplement  1 Container Oral TID BM  . folic acid  1 mg Oral Daily  . pantoprazole (PROTONIX) IV  40 mg Intravenous Q12H  . thiamine  100 mg Oral Daily   Continuous Infusions: . sodium chloride 50 mL/hr  at 11/07/18 0738  . sodium chloride     PRN Meds:.acetaminophen **OR** acetaminophen, cyclobenzaprine, hydrALAZINE, HYDROcodone-acetaminophen, morphine injection, ondansetron **OR** ondansetron (ZOFRAN) IV  DVT prophylaxis: SCDs Code Status: Full code Family Communication: wife at bedside Disposition Plan: Home when cleared by GI  Consultants:   Gastroenterology  Procedures:  EGD Findings:      The esophagus was normal.      The stomach was normal.      The examined duodenum was normal. Impression:               - Normal esophagus.                           - Normal stomach.                           - Normal examined duodenum.                           - No specimens collected. Recommendation:           - Perform an upper endoscopic ultrasound (UEUS) Now.  EUS Impression:               - Ascites was found on endosonographic examination                            of the peritoneal cavity.                           -  A mass was identified in the pancreatic head.                           - There was dilation in the middle third of the                            main bile duct and in the upper third of the main                            bile duct which measured up to 13 mm.                           - There was no sign of significant pathology in the                            gallbladder.                           - No specimens collected. Recommendation:           - MRI of the pancreas after the colonoscopy.  Antimicrobials:  None    Objective: Vitals:   11/06/18 1646 11/06/18 1704 11/06/18 2146 11/07/18 1433  BP: (!) 142/90 (!) 142/93 (!) 144/91 130/82  Pulse: 70 67 69 72  Resp:   14 16  Temp: 98.3 F (36.8 C) 98.3 F (36.8 C) 97.8 F (36.6 C) 98.2 F (36.8 C)  TempSrc: Oral Oral Oral Oral  SpO2: 100% 100% 100% 100%  Weight:      Height:        Intake/Output Summary (Last 24 hours) at 11/07/2018 1637 Last data filed at 11/07/2018 1456 Gross per 24 hour    Intake 1052.04 ml  Output -  Net 1052.04 ml   Filed Weights   11/05/18 0958 11/05/18 1540  Weight: 73.9 kg 72 kg    Examination:  Physical Exam  Patient is examined daily including today on 11/07/18  , exams remain the same as of yesterday except that has changed   Gen:- Awake Alert, no acute distress HEENT:- [place].AT, No sclera icterus Neck-Supple Neck,No JVD,.  Lungs-  CTAB , fair air movement CV- S1, S2 normal Abd-  +ve B.Sounds, Abd Soft, epigastric discomfort without rebound or guarding    Extremity/Skin:- No  edema,   good pulses Psych-affect is appropriate, oriented x3 Neuro-no new focal deficits, no tremors  CBC: Recent Labs  Lab 11/05/18 1024  11/06/18 0236 11/06/18 1356 11/06/18 2206 11/07/18 0437 11/07/18 1230  WBC 5.4  --   --   --   --  5.0  --   NEUTROABS 4.6  --   --   --   --   --   --   HGB 7.0*   < > 7.4* 7.2* 8.0* 8.1* 8.4*  HCT 23.8*  --   --   --   --  25.3*  --   MCV 80.4  --   --   --   --  79.3*  --   PLT 251  --   --   --   --  221  --    < > = values in this interval not displayed.   Basic Metabolic Panel: Recent Labs  Lab 11/05/18 1024 11/07/18 0437  NA 135 134*  K 3.5 3.6  CL 104 105  CO2 21* 23  GLUCOSE 141* 91  BUN 9 7  CREATININE 0.77 0.70  CALCIUM 8.1* 7.9*   GFR: Estimated Creatinine Clearance: 103.8 mL/min (by C-G formula based on SCr of 0.7 mg/dL). Liver Function Tests: Recent Labs  Lab 11/05/18 1024 11/07/18 0437  AST 27 17  ALT 26 19  ALKPHOS 53 45  BILITOT 0.9 1.1  PROT 5.4* 4.7*  ALBUMIN 3.3* 2.5*   Recent Labs  Lab 11/05/18 1024 11/06/18 0236  LIPASE 117* 73*   No results for input(s): AMMONIA in the last 168 hours. Coagulation Profile: No results for input(s): INR, PROTIME in the last 168 hours. Cardiac Enzymes: No results for input(s): CKTOTAL, CKMB, CKMBINDEX, TROPONINI in the last 168 hours. BNP (last 3 results) No results for input(s): PROBNP in the last 8760 hours. HbA1C: No results for  input(s): HGBA1C in the last 72 hours. CBG: No results for input(s): GLUCAP in the last 168 hours. Lipid Profile: No results for input(s): CHOL, HDL, LDLCALC, TRIG, CHOLHDL, LDLDIRECT in the last 72 hours. Thyroid Function Tests: No results for input(s): TSH, T4TOTAL, FREET4, T3FREE, THYROIDAB in the last 72 hours. Anemia Panel: No results for input(s): VITAMINB12, FOLATE, FERRITIN, TIBC, IRON, RETICCTPCT in the last 72 hours. Urine analysis:    Component Value Date/Time   COLORURINE YELLOW 01/16/2017 [place] 01/16/2017 0942   LABSPEC 1.019 01/16/2017 0942   PHURINE 6.0 01/16/2017 0942   GLUCOSEU NEGATIVE 01/16/2017 0942   HGBUR NEGATIVE 01/16/2017 0942   BILIRUBINUR NEGATIVE 01/16/2017 0942   KETONESUR NEGATIVE 01/16/2017 0942   PROTEINUR NEGATIVE 01/16/2017 0942   NITRITE NEGATIVE 01/16/2017 0942   LEUKOCYTESUR NEGATIVE 01/16/2017 0942   Sepsis Labs: Invalid input(s): PROCALCITONIN, LACTICIDVEN  No results found for this or any previous visit (from the past 240 hour(s)).    Roxan Hockey, MD  [facility]   If 7PM-7AM, please contact night-coverage [email or web address] Password [facility] 11/07/2018, 4:37 PM

## 2018-11-08 ENCOUNTER — Inpatient Hospital Stay (HOSPITAL_COMMUNITY): Payer: Medicaid Other | Admitting: Certified Registered Nurse Anesthetist

## 2018-11-08 ENCOUNTER — Encounter (HOSPITAL_COMMUNITY)
Admission: EM | Disposition: A | Discharge status: Home / Self Care | Payer: Self-pay | Source: Home / Self Care | Attending: Family Medicine

## 2018-11-08 ENCOUNTER — Encounter (HOSPITAL_COMMUNITY): Payer: Self-pay

## 2018-11-08 ENCOUNTER — Inpatient Hospital Stay (HOSPITAL_COMMUNITY): Payer: Medicaid Other

## 2018-11-08 DIAGNOSIS — K861 Other chronic pancreatitis: Secondary | ICD-10-CM

## 2018-11-08 DIAGNOSIS — K859 Acute pancreatitis without necrosis or infection, unspecified: Secondary | ICD-10-CM

## 2018-11-08 DIAGNOSIS — D509 Iron deficiency anemia, unspecified: Secondary | ICD-10-CM

## 2018-11-08 HISTORY — PX: GIVENS CAPSULE STUDY: SHX5432

## 2018-11-08 HISTORY — PX: COLONOSCOPY WITH PROPOFOL: SHX5780

## 2018-11-08 LAB — HEMOGLOBIN
Hemoglobin: 7.6 g/dL — ABNORMAL LOW (ref 13.0–17.0)
Hemoglobin: 7.5 g/dL — ABNORMAL LOW (ref 13.0–17.0)
Hemoglobin: 7.6 g/dL — ABNORMAL LOW (ref 13.0–17.0)

## 2018-11-08 LAB — PREPARE RBC (CROSSMATCH)

## 2018-11-08 SURGERY — EGD (ESOPHAGOGASTRODUODENOSCOPY)

## 2018-11-08 SURGERY — COLONOSCOPY WITH PROPOFOL
Anesthesia: Monitor Anesthesia Care

## 2018-11-08 MED ORDER — SODIUM CHLORIDE 0.9% IV SOLUTION
Freq: Once | INTRAVENOUS | Status: AC
  Administered 2018-11-08: 23:00:00 via INTRAVENOUS

## 2018-11-08 MED ORDER — PROPOFOL 500 MG/50ML IV EMUL
INTRAVENOUS | Status: DC | PRN
  Administered 2018-11-08: 100 ug/kg/min via INTRAVENOUS

## 2018-11-08 MED ORDER — PROPOFOL 10 MG/ML IV BOLUS
INTRAVENOUS | Status: DC | PRN
  Administered 2018-11-08 (×3): 10 mg via INTRAVENOUS
  Administered 2018-11-08: 20 mg via INTRAVENOUS
  Administered 2018-11-08: 10 mg via INTRAVENOUS
  Administered 2018-11-08: 20 mg via INTRAVENOUS

## 2018-11-08 MED ORDER — LACTATED RINGERS IV SOLN
INTRAVENOUS | Status: DC | PRN
  Administered 2018-11-08: 08:00:00 via INTRAVENOUS

## 2018-11-08 MED ORDER — FUROSEMIDE 10 MG/ML IJ SOLN
20.0000 mg | Freq: Once | INTRAMUSCULAR | Status: AC
  Administered 2018-11-08: 20 mg via INTRAVENOUS
  Filled 2018-11-08: qty 2

## 2018-11-08 SURGICAL SUPPLY — 22 items

## 2018-11-08 NOTE — Interval H&P Note (Signed)
History and Physical Interval Note:  11/08/2018 7:45 AM  Joe [name]  has presented today for surgery, with the diagnosis of Melena, anemia, and heme positive stool  The various methods of treatment have been discussed with the patient and family. After consideration of risks, benefits and other options for treatment, the patient has consented to  Procedure(s): COLONOSCOPY WITH PROPOFOL (N/A) as a surgical intervention .  The patient's history has been reviewed, patient examined, no change in status, stable for surgery.  I have reviewed the patient's chart and labs.  Questions were answered to the patient's satisfaction.     Dominic Pea Cirigliano

## 2018-11-08 NOTE — Progress Notes (Signed)
PROGRESS NOTE  Joe [name]:[id number] DOB: [birth year] DOA: 11/05/2018 PCP: Mack Hook, MD   LOS: 3 days   Brief Narrative / Interim history: 58 year old with history only of hypertension came into the hospital on 1/2 with epigastric pain for the last 3 days along with bloody emesis and melena, no prior history of GI bleed in the past.  He reports drinking 1-2 beers a day and taking Aleve over the last few days due to abdominal pain.  His abdominal pain radiates into the back.  He was found to be anemic in the ED with a hemoglobin of 7 down from 11.9, was started on IV Protonix and gastroenterology was consulted, for colonoscopy on 11/08/2018  Subjective: Male  friend at bedside, questions answered, hungry, wants to eat  Assessment & Plan: Active Problems:   GI bleed   Pancreatitis   Anemia   Principal Problem GI bleed with acute blood loss anemia -Gastroenterology consulted, status post EGD1/3/20 which did not show any clear evidence of a source for bleed.    colonoscopy on 11/08/18 without acute findings, capsule endoscopy placed 11/08/2018, MRCP to be done after retrieval of capsule endoscopy -Has received 2 units of packed red blood cells, hemoglobin drifting down again, give additional 1 unit of packed cells for a total of 3 units, continue Protonix   Additional Problems Pancreatic mass -Abdominal ultrasound with pancreatic head solid and cystic mass. He underwent an EUS today which showed pancreatic head mass, this will need to be further characterized with an MRI of the abdomen following his colonoscopy  Acute pancreatitis -Improving, lipase is trending down, continue to monitor, symptomatic treatment, pain control  Hypertension -Hold home medications, blood pressure stable  Alcohol use -No evidence of DTs at this time, lorazepam per CIWA protocol, thiamine and folic acid as ordered  Scheduled Meds: . sodium chloride   Intravenous Once  . feeding supplement  1  Container Oral TID BM  . folic acid  1 mg Oral Daily  . furosemide  20 mg Intravenous Once  . multivitamin with minerals  1 tablet Oral Daily  . pantoprazole (PROTONIX) IV  40 mg Intravenous Q12H  . thiamine  100 mg Oral Daily   Continuous Infusions: . sodium chloride 50 mL/hr at 11/08/18 1700   PRN Meds:.acetaminophen **OR** acetaminophen, cyclobenzaprine, hydrALAZINE, HYDROcodone-acetaminophen, LORazepam **OR** LORazepam, morphine injection, ondansetron **OR** ondansetron (ZOFRAN) IV  DVT prophylaxis: SCDs Code Status: Full code Family Communication: wife at bedside Disposition Plan: Home when cleared by GI  Consultants:   Gastroenterology  Procedures:  EGD Findings:      The esophagus was normal.      The stomach was normal.      The examined duodenum was normal. Impression:               - Normal esophagus.                           - Normal stomach.                           - Normal examined duodenum.                           - No specimens collected. Recommendation:           - Perform an upper endoscopic ultrasound (UEUS) Now.  EUS Impression:               -  Ascites was found on endosonographic examination                            of the peritoneal cavity.                           - A mass was identified in the pancreatic head.                           - There was dilation in the middle third of the                            main bile duct and in the upper third of the main                            bile duct which measured up to 13 mm.                           - There was no sign of significant pathology in the                            gallbladder.                           - No specimens collected. Recommendation:           - MRI of the pancreas after the colonoscopy.  Antimicrobials:  None    Objective: Vitals:   11/08/18 0518 11/08/18 0733 11/08/18 0837 11/08/18 0847  BP: 136/85 (!) 182/90 135/82 133/78  Pulse: 68 75 96 80  Resp: 20 12 (!)  22 18  Temp: 98 F (36.7 C) 98.4 F (36.9 C) 98.5 F (36.9 C)   TempSrc: Oral Oral Oral   SpO2: 99% 100% 100% 100%  Weight:      Height:        Intake/Output Summary (Last 24 hours) at 11/08/2018 2001 Last data filed at 11/08/2018 1700 Gross per 24 hour  Intake 1350.65 ml  Output -  Net 1350.65 ml   Filed Weights   11/05/18 0958 11/05/18 1540  Weight: 73.9 kg 72 kg    Examination:  Physical Exam  Patient is examined daily including today on 11/08/18  , exams remain the same as of yesterday except that has changed   Gen:- Awake Alert, no acute distress HEENT:- [place].AT, No sclera icterus Neck-Supple Neck,No JVD,.  Lungs-  CTAB , fair air movement CV- S1, S2 normal Abd-  +ve B.Sounds, Abd Soft, epigastric discomfort without rebound or guarding    Extremity/Skin:- No  edema,   good pulses Psych-affect is appropriate, oriented x3 Neuro-no new focal deficits, no tremors  CBC: Recent Labs  Lab 11/05/18 1024  11/07/18 0437 11/07/18 1230 11/07/18 2021 11/08/18 0552 11/08/18 1234  WBC 5.4  --  5.0  --   --   --   --   NEUTROABS 4.6  --   --   --   --   --   --   HGB 7.0*   < > 8.1* 8.4* 8.0* 7.6* 7.5*  HCT 23.8*  --  25.3*  --   --   --   --  MCV 80.4  --  79.3*  --   --   --   --   PLT 251  --  221  --   --   --   --    < > = values in this interval not displayed.   Basic Metabolic Panel: Recent Labs  Lab 11/05/18 1024 11/07/18 0437  NA 135 134*  K 3.5 3.6  CL 104 105  CO2 21* 23  GLUCOSE 141* 91  BUN 9 7  CREATININE 0.77 0.70  CALCIUM 8.1* 7.9*   GFR: Estimated Creatinine Clearance: 103.8 mL/min (by C-G formula based on SCr of 0.7 mg/dL). Liver Function Tests: Recent Labs  Lab 11/05/18 1024 11/07/18 0437  AST 27 17  ALT 26 19  ALKPHOS 53 45  BILITOT 0.9 1.1  PROT 5.4* 4.7*  ALBUMIN 3.3* 2.5*   Recent Labs  Lab 11/05/18 1024 11/06/18 0236  LIPASE 117* 73*   Urine analysis:    Component Value Date/Time   COLORURINE YELLOW 01/16/2017 0942    APPEARANCEUR CLEAR 01/16/2017 0942   LABSPEC 1.019 01/16/2017 0942   PHURINE 6.0 01/16/2017 0942   GLUCOSEU NEGATIVE 01/16/2017 0942   HGBUR NEGATIVE 01/16/2017 0942   BILIRUBINUR NEGATIVE 01/16/2017 0942   KETONESUR NEGATIVE 01/16/2017 0942   PROTEINUR NEGATIVE 01/16/2017 0942   NITRITE NEGATIVE 01/16/2017 0942   LEUKOCYTESUR NEGATIVE 01/16/2017 0942   Sepsis Labs:  Roxan Hockey, MD  [facility] If 7PM-7AM, please contact night-coverage [email or web address] Password [email or web address] 11/08/2018, 8:01 PM

## 2018-11-08 NOTE — Op Note (Signed)
[facility] Patient Name: Joe [name] Procedure Date : 11/08/2018 MRN: [id number] Attending MD: Gerrit Heck , MD Date of Birth: 1960-12-25 CSN: [id number] Age: 58 Admit Type: Inpatient Procedure:                Colonoscopy Indications:              Heme positive stool, Melena, Acute post hemorrhagic                            anemia                           58 yo male admitted with MEG pain and anemia and                            melena, with admission Hgb 7.0 from baseline of                            11.9. Admission ultrasound notable for mass in the                            HOP. EGD on 11/06/18 was normal. EUS on 11/06/18                            notable for mass in HOP, but unable to perform FNA                            due to anatomy and difficult positioning. He has                            continued to have marroon/dark stools. Transfused                            2U on admission and an additional 1 U the following                            day. Current Hgb 7.6. Providers:                Gerrit Heck, MD, Burtis Junes, RN, Dorise Hiss,                            RN, Charolette Child, Technician, Clearnce Sorrel, CRNA Referring MD:              Medicines:                Monitored Anesthesia Care Complications:            No immediate complications. Estimated Blood Loss:     Estimated blood loss: none. Procedure:                Pre-Anesthesia Assessment:                           - Prior to the procedure, a History and Physical  was performed, and patient medications and                            allergies were reviewed. The patient's tolerance of                            previous anesthesia was also reviewed. The risks                            and benefits of the procedure and the sedation                            options and risks were discussed with the patient.                            All questions were answered, and  informed consent                            was obtained. Prior Anticoagulants: The patient has                            taken no previous anticoagulant or antiplatelet                            agents. ASA Grade Assessment: II - A patient with                            mild systemic disease. After reviewing the risks                            and benefits, the patient was deemed in                            satisfactory condition to undergo the procedure.                           After obtaining informed consent, the colonoscope                            was passed under direct vision. Throughout the                            procedure, the patient's blood pressure, pulse, and                            oxygen saturations were monitored continuously. The                            CF-HQ190L ([id number]) Olympus colonoscope was                            introduced through the anus and advanced to the 10  cm into the ileum. The colonoscopy was performed                            without difficulty. The patient tolerated the                            procedure well. The quality of the bowel                            preparation was fair. Scope In: 8:03:28 AM Scope Out: 8:31:45 AM Scope Withdrawal Time: 0 hours 21 minutes 53 seconds  Total Procedure Duration: 0 hours 28 minutes 17 seconds  Findings:      The perianal and digital rectal examinations were normal.      Marron colored fluid was found in the entire colon. Lavage of the entire       colon was performed using copious amounts of sterile water, resulting in       clearance with good visualization. No active bleeding sites noted and no       fresh blood noted throughout the study. Following intubation and       evaluation of the terminal ileum (normal, as noted below), remained in       the proximal ascending colon and monitored the ICV for 5 minutes,       without any bleeding noted. Slow  withdrawal with ongoing irrigation and       lavage throughout the colon revealed no active bleeding nor stigmata of       recent bleeding site.      A 4 mm polyp was found in the transverse colon. The polyp was sessile.       This was not resected at this time so to not complicate the search for       an obscure bleeding source.      Non-bleeding internal hemorrhoids were found during retroflexion. The       hemorrhoids were medium-sized.      The visualized mucosa was otherwise normal appearing throughout the       colon. No areas of mucosal erythema, edema, erosions, or ulceration       noted.      The terminal ileum appeared normal. Small amounts of blood tinged fluid       noted, which was easily lavaged. No active bleeding or stigmata of       bleeding noted. Impression:               - Preparation of the colon was fair and adequate                            for the purposes of this study. Visualization                            improved with copious irrigation and lavage.                           - Blood-tinged, marroon fluid noted throughout the                            colon. This was lavaged as above. No active  bleeding site noted on this study as noted above.                           - One 4 mm polyp in the transverse colon. This was                            not resected.                           - Non-bleeding internal hemorrhoids.                           - The visualized mucosa was otherwise normal                            appearing throughout the colon. No areas of mucosal                            erythema, edema, erosions, or ulceration noted.                           - The examined portion of the ileum was normal.                           - No specimens collected.                           - Overall, the clinical presentation, previously                            normal EGD, and findings on this study are                             suspicious for a small bowel source of obscure                            bleeding and will therefore proceed with Video                            Capsule Endoscopy today. We had discussed this                            yesterday in the event of a negative colonoscopy.                            Given the slow down drift in serum Hgb, will                            expedite and place today and will be read tomorrow                            when images downloaded and available. Recommendation:           - Return patient to hospital ward for ongoing  care.                           - NPO for now for VCE today.                           - Ok to give clears 2 hours after capsule                            placement, then small meal (ie, sandwich) at 4                            hours and resume full diet at 8 hours post                            ingestion.                           - To visualize the small bowel, perform video                            capsule endoscopy today. The images will be                            downloaded tomorrow after study completion and read                            by Dr. Collene Mares, who will be assuming his care tomorrow.                           - Repeat hemogram this afternoon and resume serial                            H/H with pRBC transfusions per protocol.                           - MRI Pancreas protocol already ordered for further                            evaluation of HOP mass. To be completed as                            inpatient. Procedure Code(s):        --- Professional ---                           [phone], Colonoscopy, flexible; diagnostic, including                            collection of specimen(s) by brushing or washing,                            when performed (separate procedure) Diagnosis Code(s):        --- Professional ---  K64.8, Other hemorrhoids                           K92.2, Gastrointestinal  hemorrhage, unspecified                           D12.3, Benign neoplasm of transverse colon (hepatic                            flexure or splenic flexure)                           R19.5, Other fecal abnormalities                           K92.1, Melena (includes Hematochezia)                           D62, Acute posthemorrhagic anemia CPT copyright 2018 [employer]. All rights reserved. The codes documented in this report are preliminary and upon coder review may  be revised to meet current compliance requirements. Gerrit Heck, MD 11/08/2018 8:52:57 AM Number of Addenda: 0

## 2018-11-08 NOTE — Anesthesia Preprocedure Evaluation (Addendum)
Anesthesia Evaluation  Patient identified by MRN, date of birth, ID band Patient awake    Reviewed: Allergy & Precautions, NPO status , Patient's Chart, lab work & pertinent test results  Airway Mallampati: II  TM Distance: >3 FB Neck ROM: Full    Dental  (+) Chipped, Dental Advisory Given,    Pulmonary neg pulmonary ROS,    breath sounds clear to auscultation       Cardiovascular hypertension, Pt. on medications  Rhythm:Regular Rate:Normal     Neuro/Psych negative psych ROS   GI/Hepatic negative GI ROS, Neg liver ROS,   Endo/Other  negative endocrine ROS  Renal/GU negative Renal ROS     Musculoskeletal negative musculoskeletal ROS (+)   Abdominal Normal abdominal exam  (+)   Peds  Hematology negative hematology ROS (+)   Anesthesia Other Findings   Reproductive/Obstetrics                            Lab Results  Component Value Date   WBC 5.0 11/07/2018   HGB 7.6 (L) 11/08/2018   HCT 25.3 (L) 11/07/2018   MCV 79.3 (L) 11/07/2018   PLT 221 11/07/2018   Lab Results  Component Value Date   CREATININE 0.70 11/07/2018   BUN 7 11/07/2018   NA 134 (L) 11/07/2018   K 3.6 11/07/2018   CL 105 11/07/2018   CO2 23 11/07/2018   No results found for: INR, PROTIME  Anesthesia Physical Anesthesia Plan  ASA: III  Anesthesia Plan: MAC   Post-op Pain Management:    Induction: Intravenous  PONV Risk Score and Plan: 2 and Propofol infusion and Ondansetron  Airway Management Planned: Natural Airway  Additional Equipment: None  Intra-op Plan:   Post-operative Plan:   Informed Consent: I have reviewed the patients History and Physical, chart, labs and discussed the procedure including the risks, benefits and alternatives for the proposed anesthesia with the patient or authorized representative who has indicated his/her understanding and acceptance.   Dental advisory given  Plan  Discussed with: CRNA  Anesthesia Plan Comments:        Anesthesia Quick Evaluation

## 2018-11-08 NOTE — Anesthesia Postprocedure Evaluation (Signed)
Anesthesia Post Note  Patient: Joe [name]  Procedure(s) Performed: COLONOSCOPY WITH PROPOFOL (N/A )     Patient location during evaluation: PACU Anesthesia Type: MAC Level of consciousness: awake and alert Pain management: pain level controlled Vital Signs Assessment: post-procedure vital signs reviewed and stable Respiratory status: spontaneous breathing, nonlabored ventilation, respiratory function stable and patient connected to nasal cannula oxygen Cardiovascular status: stable and blood pressure returned to baseline Postop Assessment: no apparent nausea or vomiting Anesthetic complications: no    Last Vitals:  Vitals:   11/08/18 0837 11/08/18 0847  BP: 135/82 133/78  Pulse: 96 80  Resp: (!) 22 18  Temp: 36.9 C   SpO2: 100% 100%    Last Pain:  Vitals:   11/08/18 0847  TempSrc:   PainSc: 0-No pain                 Effie Berkshire

## 2018-11-08 NOTE — Progress Notes (Signed)
Pt post colonoscopy small bowel capsule Dr. Bryan Lemma.  Patient swallowed pill cam at 0900 11/08/18 unable to perform MRI abdomen ww/o until camera passes through entire digestive tract per Dr Clovis Riley. RN informed and patient understands the MRI is postponed until pill cam passes

## 2018-11-08 NOTE — Progress Notes (Signed)
Pt post colonoscopy, verbal order for small bowel capsule Dr. Bryan Lemma.  Patient swallowed pill cam at 0900.  Tolerated well.  Instructions given to patient and family.  Verbalized understanding.  Instructions given to floor RN

## 2018-11-08 NOTE — Transfer of Care (Signed)
Immediate Anesthesia Transfer of Care Note  Patient: Joe [name]  Procedure(s) Performed: COLONOSCOPY WITH PROPOFOL (N/A )  Patient Location: Endoscopy Unit  Anesthesia Type:MAC  Level of Consciousness: awake, alert  and oriented  Airway & Oxygen Therapy: Patient Spontanous Breathing  Post-op Assessment: Report given to RN and Post -op Vital signs reviewed and stable  Post vital signs: Reviewed and stable  Last Vitals:  Vitals Value Taken Time  BP 135/82 11/08/2018  8:38 AM  Temp    Pulse 92 11/08/2018  8:38 AM  Resp 21 11/08/2018  8:38 AM  SpO2 100 % 11/08/2018  8:38 AM  Vitals shown include unvalidated device data.  Last Pain:  Vitals:   11/08/18 0733  TempSrc: Oral  PainSc: 10-Worst pain ever      Patients Stated Pain Goal: 2 (80/99/83 3825)  Complications: No apparent anesthesia complications

## 2018-11-09 ENCOUNTER — Inpatient Hospital Stay (HOSPITAL_COMMUNITY): Payer: Medicaid Other

## 2018-11-09 LAB — COMPREHENSIVE METABOLIC PANEL
ALT: 14 U/L (ref 0–44)
AST: 14 U/L — ABNORMAL LOW (ref 15–41)
Albumin: 2.3 g/dL — ABNORMAL LOW (ref 3.5–5.0)
Alkaline Phosphatase: 57 U/L (ref 38–126)
Anion gap: 7 (ref 5–15)
BUN: 5 mg/dL — ABNORMAL LOW (ref 6–20)
CO2: 24 mmol/L (ref 22–32)
CREATININE: 0.82 mg/dL (ref 0.61–1.24)
Calcium: 7.7 mg/dL — ABNORMAL LOW (ref 8.9–10.3)
Chloride: 105 mmol/L (ref 98–111)
GFR calc Af Amer: >60 mL/min (ref >60)
GFR calc non Af Amer: >60 mL/min (ref >60)
Glucose, Bld: 98 mg/dL (ref 70–99)
Potassium: 3 mmol/L — ABNORMAL LOW (ref 3.5–5.1)
Sodium: 136 mmol/L (ref 135–145)
Total Bilirubin: 1.3 mg/dL — ABNORMAL HIGH (ref 0.3–1.2)
Total Protein: 4.9 g/dL — ABNORMAL LOW (ref 6.5–8.1)

## 2018-11-09 LAB — CBC
HCT: 24.7 % — ABNORMAL LOW (ref 39.0–52.0)
Hemoglobin: 8 g/dL — ABNORMAL LOW (ref 13.0–17.0)
MCH: 25.6 pg — ABNORMAL LOW (ref 26.0–34.0)
MCHC: 32.4 g/dL (ref 30.0–36.0)
MCV: 79.2 fL — ABNORMAL LOW (ref 80.0–100.0)
Platelets: 205 10*3/uL (ref 150–400)
RBC: 3.12 MIL/uL — ABNORMAL LOW (ref 4.22–5.81)
RDW: 16.2 % — ABNORMAL HIGH (ref 11.5–15.5)
WBC: 4.2 10*3/uL (ref 4.0–10.5)
nRBC: 0 % (ref 0.0–0.2)

## 2018-11-09 LAB — HEMOGLOBIN: Hemoglobin: 8.1 g/dL — ABNORMAL LOW (ref 13.0–17.0)

## 2018-11-09 MED ORDER — GADOBUTROL 1 MMOL/ML IV SOLN
7.0000 mL | Freq: Once | INTRAVENOUS | Status: AC | PRN
  Administered 2018-11-09: 7 mL via INTRAVENOUS

## 2018-11-09 MED ORDER — POTASSIUM CHLORIDE CRYS ER 20 MEQ PO TBCR
40.0000 meq | EXTENDED_RELEASE_TABLET | Freq: Once | ORAL | Status: AC
  Administered 2018-11-09: 40 meq via ORAL
  Filled 2018-11-09: qty 2

## 2018-11-09 MED ORDER — POTASSIUM CHLORIDE CRYS ER 20 MEQ PO TBCR: 40.0000 meq | EXTENDED_RELEASE_TABLET | Freq: Once | ORAL | Status: DC

## 2018-11-09 MED ORDER — POTASSIUM CHLORIDE 20 MEQ PO PACK
40.0000 meq | PACK | Freq: Once | ORAL | Status: AC
  Administered 2018-11-09: 40 meq via ORAL
  Filled 2018-11-09: qty 2

## 2018-11-09 MED ORDER — POTASSIUM CHLORIDE CRYS ER 20 MEQ PO TBCR
40.0000 meq | EXTENDED_RELEASE_TABLET | Freq: Once | ORAL | Status: AC
  Administered 2018-11-10: 40 meq via ORAL
  Filled 2018-11-09: qty 2

## 2018-11-09 NOTE — Anesthesia Postprocedure Evaluation (Signed)
Anesthesia Post Note  Patient: [name]  Procedure(s) Performed: ESOPHAGOGASTRODUODENOSCOPY (EGD) WITH PROPOFOL (N/A ) FULL UPPER ENDOSCOPIC ULTRASOUND (EUS) RADIAL     Patient location during evaluation: Endoscopy Anesthesia Type: MAC Level of consciousness: awake and alert Pain management: pain level controlled Vital Signs Assessment: post-procedure vital signs reviewed and stable Respiratory status: spontaneous breathing, nonlabored ventilation, respiratory function stable and patient connected to nasal cannula oxygen Cardiovascular status: stable and blood pressure returned to baseline Postop Assessment: no apparent nausea or vomiting Anesthetic complications: no    Last Vitals:  Vitals:   11/09/18 0114 11/09/18 0520  BP: 127/84 132/86  Pulse: 68 71  Resp: 18 16  Temp: 37.4 C 37.2 C  SpO2:  97%    Last Pain:  Vitals:   11/09/18 0520  TempSrc: Oral  PainSc: 8                  Catalina Gravel

## 2018-11-09 NOTE — Progress Notes (Signed)
UNASSIGNED PATIENT Subjective: Joe [name] is a 58 year old black male came into the hospital on 11/06/2018 with epigastric pain for 3 days along with bloody emesis and melena. Iin the process of his workup was found to have a drop in his hemoglobin from 11.97 g/dL He admits he admits to taking NSAIDS occasionally. 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 No focal liver lesions were noted. On 11/06/2018 had an EGD that was normal. An EUS was attempted with FNA but definitely was not possible due to all clinical anatomy. He underwent a colonoscopy on 11/08/2018 1 blood was noted throughout the colon and a small 4 mm polyp was removed from the transverse colon. A small bowel capsule was done shortly thereafter. The patient's MRCP was delayed because of the capsular placement. Patient denies having any abdominal pain melena or hematochezia today. He did see the capsule passed in the stool earlier today.  Objective: Vital signs in last 24 hours: Temp:  [97.9 F (36.6 C)-99.4 F (37.4 C)] 97.9 F (36.6 C) (01/06 1402) Pulse Rate:  [68-81] 75 (01/06 1402) Resp:  [16-18] 16 (01/06 0520) BP: (113-132)/(67-86) 120/73 (01/06 1402) SpO2:  [97 %-100 %] 100 % (01/06 1402) Last BM Date: 11/08/18  Intake/Output from previous day: 01/05 0701 - 01/06 0700 In: 1905.7 [P.O.:240; I.V.:1350.7; Blood:315] Out: -  Intake/Output this shift: No intake/output data recorded.  General appearance: alert, cooperative, appears stated age and fatigued Resp: clear to auscultation bilaterally Cardio: regular rate and rhythm, S1, S2 normal, no murmur, click, rub or gallop GI: soft, non-tender; bowel sounds normal; no masses,   no organomegaly Extremities: extremities normal, atraumatic, no cyanosis or edema  Lab Results: Recent Labs    11/07/18 0437  11/08/18 1234 11/08/18 2042 11/09/18 0455  WBC 5.0  --   --   --  4.2  HGB 8.1*   < > 7.5* 7.6* 8.0*  HCT 25.3*  --   --   --  24.7*  PLT 221  --   --   --  205   < > = values in this interval not displayed.   BMET Recent Labs    11/07/18 0437 11/09/18 0455  NA 134* 136  K 3.6 3.0*  CL 105 105  CO2 23 24  GLUCOSE 91 98  BUN 7 5*  CREATININE 0.70 0.82  CALCIUM 7.9* 7.7*   LFT Recent Labs    11/09/18 0455  PROT 4.9*  ALBUMIN 2.3*  AST 14*  ALT 14  ALKPHOS 57  BILITOT 1.3*   Medications: I have reviewed the patient's current medications.  Assessment/Plan: 1) Severe anemia with melenic stools-EGD was normal; the EUS revealed a mass in the pancreatic head but the appendix was not possible due to difficult anatomy; colonoscopy done on 11/08/2018 revealed a large amount of blood throughout the colon and a small 4 mm transverse colon polyp was removed but no definite source of bleeding was identified and 5 in spite of copious lavage; small bowel capsule study done shortly after colonoscopy was read today and was essentially unrevealing; there was some blackish debris in the small bowel but not fresh or old blood was noted throughout the small bowel. No definite source of blood loss was  identified. Patient scheduled for an MRCP CP liter today. We will await those results and make further recommendations accordingly.   LOS: 4 days   Shardee Dieu 11/09/2018, 2:24 PM

## 2018-11-09 NOTE — Progress Notes (Signed)
PROGRESS NOTE  MANA MORISON AVW:[id number] DOB: 1961/10/15 DOA: 11/05/2018 PCP: Mack Hook, MD   LOS: 4 days   Brief Narrative / Interim history: 58 year old with history only of hypertension came into the hospital on 1/2 with epigastric pain for the last 3 days along with bloody emesis and melena, no prior history of GI bleed in the past.  He reports drinking 1-2 beers a day and taking Aleve over the last few days due to abdominal pain.  His abdominal pain radiates into the back.  He was found to be anemic in the ED with a hemoglobin of 7 down from 11.9, was started on IV Protonix and gastroenterology was consulted, had colonoscopy on 11/08/2018  Subjective: Significant other at bedside, comfortable endoscopy retrieved, awaiting MRI, nausea but no emesis, abdominal pain is improving, patient wants to eat  Assessment & Plan: Active Problems:   GI bleed   Pancreatitis   Anemia   Principal Problem GI bleed with acute blood loss anemia -Gastroenterology consulted, status post EGD1/3/20 which did not show any clear evidence of a source for bleed.    colonoscopy on 11/08/18 without acute findings, capsule endoscopy placed 11/08/2018, MRCP to be done after retrieval of capsule endoscopy -Has received 3 units of packed red blood cells this admission,--- hemoglobin 8.1--  continue Protonix   Additional Problems Pancreatic mass -Abdominal ultrasound with pancreatic head solid and cystic mass. He underwent an EUS which showed pancreatic head mass, this will need to be further characterized with an MRI of the abdomen following his colonoscopy..... Capsule endoscopy has been retrieved may proceed with abdominal MRI..... Full liquid diet later today  Acute pancreatitis  abdominal pain improved, and lipase is was down to 71 from 117, continue to monitor, symptomatic treatment, pain control  Hypertension -Continue to hold losartan/HCTZ, blood pressure stable  Alcohol use -No evidence of DTs  at this time, lorazepam per CIWA protocol, c/n  thiamine and folic acid as ordered  Scheduled Meds: . feeding supplement  1 Container Oral TID BM  . folic acid  1 mg Oral Daily  . multivitamin with minerals  1 tablet Oral Daily  . pantoprazole (PROTONIX) IV  40 mg Intravenous Q12H  . potassium chloride  40 mEq Oral Once  . potassium chloride  40 mEq Oral Once  . potassium chloride  40 mEq Oral Once  . thiamine  100 mg Oral Daily   Continuous Infusions: . sodium chloride 50 mL/hr at 11/09/18 0434   PRN Meds:.acetaminophen **OR** acetaminophen, cyclobenzaprine, hydrALAZINE, HYDROcodone-acetaminophen, LORazepam **OR** LORazepam, morphine injection, ondansetron **OR** ondansetron (ZOFRAN) IV  DVT prophylaxis: SCDs Code Status: Full code Family Communication: wife at bedside Disposition Plan: Home when cleared by GI  Consultants:   Gastroenterology  Procedures:  EGD Findings:      The esophagus was normal.      The stomach was normal.      The examined duodenum was normal. Impression:               - Normal esophagus.                           - Normal stomach.                           - Normal examined duodenum.                           -  No specimens collected. Recommendation:           - Perform an upper endoscopic ultrasound (UEUS) Now.  EUS Impression:               - Ascites was found on endosonographic examination                            of the peritoneal cavity.                           - A mass was identified in the pancreatic head.                           - There was dilation in the middle third of the                            main bile duct and in the upper third of the main                            bile duct which measured up to 13 mm.                           - There was no sign of significant pathology in the                            gallbladder.                           - No specimens collected. Recommendation:           - MRI of the pancreas  after the colonoscopy.  Antimicrobials:  None    Objective: Vitals:   11/08/18 2241 11/09/18 0114 11/09/18 0520 11/09/18 1402  BP: 113/67 127/84 132/86 120/73  Pulse: 81 68 71 75  Resp: 16 18 16    Temp: 98.9 F (37.2 C) 99.4 F (37.4 C) 99 F (37.2 C) 97.9 F (36.6 C)  TempSrc: Oral Oral Oral Oral  SpO2:   97% 100%  Weight:      Height:        Intake/Output Summary (Last 24 hours) at 11/09/2018 1625 Last data filed at 11/09/2018 1500 Gross per 24 hour  Intake 1960.65 ml  Output -  Net 1960.65 ml   Filed Weights   11/05/18 0958 11/05/18 1540  Weight: 73.9 kg 72 kg    Examination:  Physical Exam  Patient is examined daily including today on 11/09/18  , exams remain the same as of yesterday except that has changed   Gen:- Awake Alert, no acute distress HEENT:- [place].AT, No sclera icterus Neck-Supple Neck,No JVD,.  Lungs-  CTAB , fair air movement CV- S1, S2 normal Abd-  +ve B.Sounds, Abd Soft, epigastric discomfort without rebound or guarding    Extremity/Skin:- No  edema,   good pulses Psych-affect is appropriate, oriented x3 Neuro-no new focal deficits, no tremors  CBC: Recent Labs  Lab 11/05/18 1024  11/07/18 0437  11/08/18 0552 11/08/18 1234 11/08/18 2042 11/09/18 0455 11/09/18 1403  WBC 5.4  --  5.0  --   --   --   --  4.2  --   NEUTROABS  4.6  --   --   --   --   --   --   --   --   HGB 7.0*   < > 8.1*   < > 7.6* 7.5* 7.6* 8.0* 8.1*  HCT 23.8*  --  25.3*  --   --   --   --  24.7*  --   MCV 80.4  --  79.3*  --   --   --   --  79.2*  --   PLT 251  --  221  --   --   --   --  205  --    < > = values in this interval not displayed.   Basic Metabolic Panel: Recent Labs  Lab 11/05/18 1024 11/07/18 0437 11/09/18 0455  NA 135 134* 136  K 3.5 3.6 3.0*  CL 104 105 105  CO2 21* 23 24  GLUCOSE 141* 91 98  BUN 9 7 5*  CREATININE 0.77 0.70 0.82  CALCIUM 8.1* 7.9* 7.7*   GFR: Estimated Creatinine Clearance: 101.2 mL/min (by C-G formula based on SCr of  0.82 mg/dL). Liver Function Tests: Recent Labs  Lab 11/05/18 1024 11/07/18 0437 11/09/18 0455  AST 27 17 14*  ALT 26 19 14   ALKPHOS 53 45 57  BILITOT 0.9 1.1 1.3*  PROT 5.4* 4.7* 4.9*  ALBUMIN 3.3* 2.5* 2.3*   Recent Labs  Lab 11/05/18 1024 11/06/18 0236  LIPASE 117* 73*   Urine analysis:    Component Value Date/Time   COLORURINE YELLOW 01/16/2017 [place] 01/16/2017 0942   LABSPEC 1.019 01/16/2017 0942   PHURINE 6.0 01/16/2017 0942   GLUCOSEU NEGATIVE 01/16/2017 0942   HGBUR NEGATIVE 01/16/2017 0942   BILIRUBINUR NEGATIVE 01/16/2017 0942   KETONESUR NEGATIVE 01/16/2017 0942   PROTEINUR NEGATIVE 01/16/2017 0942   NITRITE NEGATIVE 01/16/2017 0942   LEUKOCYTESUR NEGATIVE 01/16/2017 0942   Sepsis Labs:  Roxan Hockey, MD  [facility] If 7PM-7AM, please contact night-coverage [email or web address] Password [facility] 11/09/2018, 4:25 PM

## 2018-11-10 ENCOUNTER — Encounter (HOSPITAL_COMMUNITY): Payer: Self-pay | Admitting: Physician Assistant

## 2018-11-10 ENCOUNTER — Inpatient Hospital Stay (HOSPITAL_COMMUNITY): Payer: Medicaid Other

## 2018-11-10 DIAGNOSIS — E440 Moderate protein-calorie malnutrition: Secondary | ICD-10-CM

## 2018-11-10 HISTORY — PX: IR US GUIDE VASC ACCESS RIGHT: IMG2390

## 2018-11-10 HISTORY — PX: IR ANGIOGRAM SELECTIVE EACH ADDITIONAL VESSEL: IMG667

## 2018-11-10 HISTORY — PX: IR ANGIOGRAM VISCERAL SELECTIVE: IMG657

## 2018-11-10 HISTORY — PX: IR EMBO ART  VEN HEMORR LYMPH EXTRAV  INC GUIDE ROADMAPPING: IMG5450

## 2018-11-10 LAB — COMPREHENSIVE METABOLIC PANEL
ALT: 13 U/L (ref 0–44)
AST: 14 U/L — ABNORMAL LOW (ref 15–41)
Albumin: 2.2 g/dL — ABNORMAL LOW (ref 3.5–5.0)
Alkaline Phosphatase: 70 U/L (ref 38–126)
Anion gap: 8 (ref 5–15)
BUN: 5 mg/dL — ABNORMAL LOW (ref 6–20)
CO2: 24 mmol/L (ref 22–32)
Calcium: 8 mg/dL — ABNORMAL LOW (ref 8.9–10.3)
Chloride: 106 mmol/L (ref 98–111)
Creatinine, Ser: 0.86 mg/dL (ref 0.61–1.24)
GFR calc Af Amer: >60 mL/min (ref >60)
GFR calc non Af Amer: >60 mL/min (ref >60)
Glucose, Bld: 93 mg/dL (ref 70–99)
Potassium: 3.8 mmol/L (ref 3.5–5.1)
Sodium: 138 mmol/L (ref 135–145)
Total Bilirubin: 1.2 mg/dL (ref 0.3–1.2)
Total Protein: 4.6 g/dL — ABNORMAL LOW (ref 6.5–8.1)

## 2018-11-10 LAB — CBC
HCT: 22.9 % — ABNORMAL LOW (ref 39.0–52.0)
Hemoglobin: 7.4 g/dL — ABNORMAL LOW (ref 13.0–17.0)
MCH: 25.5 pg — ABNORMAL LOW (ref 26.0–34.0)
MCHC: 32.3 g/dL (ref 30.0–36.0)
MCV: 79 fL — ABNORMAL LOW (ref 80.0–100.0)
Platelets: 207 10*3/uL (ref 150–400)
RBC: 2.9 MIL/uL — AB (ref 4.22–5.81)
RDW: 16.6 % — ABNORMAL HIGH (ref 11.5–15.5)
WBC: 3.6 10*3/uL — ABNORMAL LOW (ref 4.0–10.5)
nRBC: 0 % (ref 0.0–0.2)

## 2018-11-10 LAB — PREPARE RBC (CROSSMATCH)

## 2018-11-10 MED ORDER — IOPAMIDOL (ISOVUE-300) INJECTION 61%
INTRAVENOUS | Status: AC
  Administered 2018-11-10: 80 mL
  Filled 2018-11-10: qty 50

## 2018-11-10 MED ORDER — SODIUM CHLORIDE 0.9% IV SOLUTION
Freq: Once | INTRAVENOUS | Status: AC
  Administered 2018-11-10: 16:00:00 via INTRAVENOUS

## 2018-11-10 MED ORDER — FUROSEMIDE 10 MG/ML IJ SOLN
20.0000 mg | Freq: Once | INTRAMUSCULAR | Status: AC
  Administered 2018-11-10: 20 mg via INTRAVENOUS
  Filled 2018-11-10: qty 2

## 2018-11-10 MED ORDER — LIDOCAINE HCL 1 % IJ SOLN
INTRAMUSCULAR | Status: AC | PRN
  Administered 2018-11-10 (×2): 10 mL

## 2018-11-10 MED ORDER — MIDAZOLAM HCL 2 MG/2ML IJ SOLN
INTRAMUSCULAR | Status: AC
  Filled 2018-11-10: qty 4

## 2018-11-10 MED ORDER — LIDOCAINE HCL 1 % IJ SOLN
INTRAMUSCULAR | Status: AC
  Filled 2018-11-10: qty 20

## 2018-11-10 MED ORDER — FENTANYL CITRATE (PF) 100 MCG/2ML IJ SOLN
INTRAMUSCULAR | Status: AC
  Filled 2018-11-10: qty 4

## 2018-11-10 MED ORDER — FENTANYL CITRATE (PF) 100 MCG/2ML IJ SOLN
INTRAMUSCULAR | Status: AC | PRN
  Administered 2018-11-10: 50 ug via INTRAVENOUS
  Administered 2018-11-10: 25 ug via INTRAVENOUS

## 2018-11-10 MED ORDER — ENSURE ENLIVE PO LIQD
237.0000 mL | Freq: Three times a day (TID) | ORAL | Status: DC
  Administered 2018-11-10 – 2018-11-13 (×11): 237 mL via ORAL

## 2018-11-10 MED ORDER — IOPAMIDOL (ISOVUE-300) INJECTION 61%
INTRAVENOUS | Status: AC
  Administered 2018-11-10: 10 mL via INTRA_ARTERIAL
  Filled 2018-11-10: qty 100

## 2018-11-10 MED ORDER — IOPAMIDOL (ISOVUE-370) INJECTION 76%
100.0000 mL | Freq: Once | INTRAVENOUS | Status: AC
  Administered 2018-11-10: 100 mL via INTRAVENOUS

## 2018-11-10 MED ORDER — MIDAZOLAM HCL 2 MG/2ML IJ SOLN
INTRAMUSCULAR | Status: AC | PRN
  Administered 2018-11-10 (×2): 1 mg via INTRAVENOUS

## 2018-11-10 NOTE — Sedation Documentation (Signed)
Procedure started. Pt crying at this time, states that he is nervous. Reassured pt, emotional support provided

## 2018-11-10 NOTE — Sedation Documentation (Signed)
Vital signs stable. 

## 2018-11-10 NOTE — Sedation Documentation (Signed)
Patient is resting comfortably. 

## 2018-11-10 NOTE — Procedures (Addendum)
Interventional Radiology Procedure Note  Procedure: US guided access r CFA.  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  Angioseal for closure .  Complications: None  Recommendations:  - Serial H&H - Right hip straight x 6 hours - Do not submerge for 7 days - Routine wound care  - may advance diet per primary team  Signed,  Dulcy Fanny. Earleen Newport, DO

## 2018-11-10 NOTE — Progress Notes (Signed)
Extensive discussion about the pseudoaneursym was made with Drs. Earleen Newport and [employer].  Dr. Denton Brick also reports that he continues to have hematochezia and there is a downward trend of his HGB.  Extensive work up with an EGD, colonoscopy, and VCE were not revealing for any source of bleeding.  The pressing issue at this time is the pseudoaneursym, which can be immediately life-threatening.  The plan is for Dr. Earleen Newport to stent the vessel, unfortunately, the source of the aneurysm is not known.  It is not clear if this is all from an inflammatory issue versus a malignant source.  The nature of this cystic mass may not be known for quite some time.  The EUS was negative for any abnormalities in the CBD outside of the significant ductal dilation.  There was no evidence of suggest hemobilia or any retroperitoneal bleeding.  After stenting, if the patient continues to bleed a bleeding scan can be performed.

## 2018-11-10 NOTE — Sedation Documentation (Signed)
Pt is stable at this time, no complaints. Procedure continues

## 2018-11-10 NOTE — Sedation Documentation (Signed)
Patient is resting comfortably. No  complaints at this time, procedure continues ?

## 2018-11-10 NOTE — Consult Note (Signed)
Chief Complaint: Patient was seen in consultation today for mesenteric angiogram with possible intervention.  Referring Physician(s): Dr. Saralyn Pilar Hung/Dr. Courage Denton Brick  Supervising Physician: Corrie Mckusick  Patient Status: [facility] - In-pt  History of Present Illness: Joe [name] is a 58 y.o. male with a past medical history significant for HTN and current ETOH abuse who presented to [facility] ED on 11/05/18 due to complaints of abdominal pain, hematemesis and melena. His stool was found to be hemoccult positive with hemoglobin of 7.0 - he was admitted for further evaluation and management. GI was consulted and patient underwent EGD/EUS on 11/06/18 - EGD showed normal esophagus, stomach and duodenum - no biopsies were obtained, EUS showed an anechoic structure in the subhepatic peritoneal space, a round hypoechoic mass in the pancreatic head which was unable to be biopsied due to anatomy, dilation of the middle and upper third of the main bile duct, unremarkable gallbladder. He underwent colonoscopy on 11/08/18 which showed blood-tinged maroon fluid throughout the colon without active bleeding site identified, one 4 mm polyp in the transverse colon which was not resected, non-bleeding internal hemorrhoids. He then underwent capsule endoscopy on 11/09/18 which showed no active source of bleeding.   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 Request has been made to IR for emergent mesenteric angiogram  with possible intervention.  Patient denies any complaints currently - his abdominal pain has resolved with pain medication and he was able to tolerate some broth this afternoon. He is concerned about what is going on with him and anxious about the planned intervention stating "it's just a lot to take in." Discussion regarding procedure with patient's significant other, Dr. Denton Brick, Dr. Benson Norway and Dr. Earleen Newport at bedside.  Past Medical History:  Diagnosis Date  . Hypertension 2016    Past Surgical History:  Procedure Laterality Date  . COLONOSCOPY WITH PROPOFOL N/A 11/08/2018   Procedure: COLONOSCOPY WITH PROPOFOL;  Surgeon: Lavena Bullion, DO;  Location: [place];  Service: Gastroenterology;  Laterality: N/A;  . ESOPHAGOGASTRODUODENOSCOPY (EGD) WITH PROPOFOL N/A 11/06/2018   Procedure: ESOPHAGOGASTRODUODENOSCOPY (EGD) WITH PROPOFOL;  Surgeon: Carol Ada, MD;  Location: [place];  Service: Endoscopy;  Laterality: N/A;  . EUS  11/06/2018   Procedure: FULL UPPER ENDOSCOPIC ULTRASOUND (EUS) RADIAL;  Surgeon: Carol Ada, MD;  Location: [place];  Service: Endoscopy;;  . GIVENS CAPSULE STUDY N/A 11/08/2018   Procedure: GIVENS CAPSULE STUDY;  Surgeon: Lavena Bullion, DO;  Location: [place];  Service: Gastroenterology;  Laterality: N/A;  . KNEE SURGERY Bilateral 1985   Arthroscopic for torn cartilage and ligaments.      Allergies: Hctz [hydrochlorothiazide]  Medications: Prior to Admission medications   Medication Sig Start Date End Date Taking? Authorizing Provider  cyclobenzaprine (FLEXERIL) 10 MG tablet 1/2 to 1 tab by mouth every 8 hours as needed for muscle spasm 07/08/18  Yes Mack Hook, MD  losartan-hydrochlorothiazide (HYZAAR) 100-12.5 MG tablet Take 1 tablet by mouth daily. 02/18/18  Yes Mack Hook, MD     Family History  Problem Relation Age of Onset  . Cancer Mother  uncertain what was wrong--mother would not share, but gradual decline.    . Hypertension Mother   . Hypertension Father     Social History   Socioeconomic History  . Marital status: Soil scientist    Spouse name: [name]  . Number of children: 2  . Years of education: 13  . Highest education level: 12th grade  Occupational History  . Occupation: Packing/stacking/loading    Comment: soaps, shaving implements, etc.  Social Needs  . Financial resource strain: Not on file  . Food insecurity:    Worry: Not on file    Inability: Not on file  . Transportation needs:    Medical: Not on file    Non-medical: Not on file  Tobacco Use  . Smoking status: Never Smoker  . Smokeless tobacco: Never Used  Substance and Sexual Activity  . Alcohol use: Yes    Comment: 44 oz daily  . Drug use: No  . Sexual activity: Not on file  Lifestyle  . Physical activity:    Days per week: Not on file    Minutes per session: Not on file  . Stress: Not on file  Relationships  . Social connections:    Talks on phone: Not on file    Gets together: Not on file    Attends religious service: Not on file    Active member of club or organization: Not on file    Attends meetings of clubs or organizations: Not on file    Relationship status: Not on file  Other Topics Concern  . Not on file  Social History Narrative   Lives with his partner of 30+ years and 5 yo daughter, [name].        Review of Systems: A 12 point ROS discussed and pertinent positives are indicated in the HPI above.  All other systems are negative.  Review of Systems  Constitutional: Positive for appetite change and fatigue. Negative for chills and fever.  Respiratory: Negative for cough and shortness of breath.   Cardiovascular: Negative for chest pain.  Gastrointestinal: Positive for abdominal pain (None currently), blood in stool, nausea and vomiting.  Genitourinary: Negative for dysuria and hematuria.  Neurological: Negative for dizziness, syncope, light-headedness and headaches.   Psychiatric/Behavioral: The patient is nervous/anxious.     Vital Signs: BP 127/84   Pulse 72   Temp 98.3 F (36.8 C) (Oral)   Resp 16   Ht 5\' 11"  (1.803 m)   Wt 158 lb 11.2 oz (72 kg)   SpO2 100%   BMI 22.13 kg/m   Physical Exam Vitals signs and nursing note reviewed.  Constitutional:      General: He is not in acute distress.    Appearance: He is ill-appearing.  HENT:     Head: Normocephalic and atraumatic.  Cardiovascular:     Rate and Rhythm: Normal rate and regular rhythm.  Pulmonary:     Effort: Pulmonary effort is normal.     Breath sounds: Normal breath sounds.  Abdominal:     General: There is no distension.     Palpations: Abdomen is soft.     Tenderness: There is no abdominal tenderness.  Skin:    General: Skin is warm and dry.     Coloration: Skin is pale.  Neurological:     Mental Status: He is alert and oriented to person, place, and time.  Psychiatric:        Mood and Affect: Mood normal.  Behavior: Behavior normal.        Thought Content: Thought content normal.        Judgment: Judgment normal.      MD Evaluation Airway: WNL Heart: WNL Abdomen: WNL Abdomen comments: epigastric pain Chest/ Lungs: WNL ASA  Classification: 2 Mallampati/Airway Score: Two   Imaging: Mr Abdomen W Wo Contrast  Result Date: 11/09/2018 CLINICAL DATA:  Epigastric pain with bloody vomiting and melena for 3 days. History of hypertension and pancreatitis. 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 EXAM: MRI ABDOMEN WITHOUT AND WITH CONTRAST TECHNIQUE: Multiplanar multisequence MR imaging of the abdomen was performed both before and after the administration of intravenous contrast. CONTRAST:  7 cc Gadavist COMPARISON:  Abdominal ultrasound 11/05/2018. FINDINGS: Lower chest: Small bilateral pleural effusions with dependent atelectasis at both lung bases. Hepatobiliary: No morphologic changes of cirrhosis or significant hepatic steatosis. There is no focal hepatic lesion or  abnormal enhancement following contrast. No evidence of gallstones or gallbladder wall thickening. There is moderate extrahepatic biliary dilatation with the common hepatic duct measuring up to 17 mm in diameter. The duct tapers distally and appears compressed by the process in the pancreatic head. No evidence of choledocholithiasis. Pancreas: There is prominent dilatation of the main pancreatic duct in the pancreatic body and tail, measuring up to 13 mm in diameter. There is associated side branch dilatation as well. There is a large complex, predominately cystic mass involving the pancreatic head. This demonstrates heterogeneous T2 signal, but predominantly homogeneous T1 signal prior to contrast administration. Following contrast, there is a homogeneous enhancing component anteriorly which follows blood pool, most likely a pseudoaneurysm based on previous ultrasound. This measures 15 x 25 mm on image 67/19 and 17 mm on coronal image 59/27. This likely arises from the gastric duodenal artery. The remainder of the mass is predominantly cystic, although does have thickened enhancing septations. Overall dimensions of the mass are approximately 7.9 x 7.1 x 5.8 cm. Spleen: Normal in size without focal abnormality. Adrenals/Urinary Tract: Both adrenal glands appear normal. The kidneys and ureters appear normal. No hydronephrosis. Stomach/Bowel: Mild diffuse bowel wall thickening, especially in the right colon, likely related to ascites. No significant bowel distension or focal abnormality. Vascular/Lymphatic: There are no enlarged abdominal lymph nodes. Probable aortic and branch vessel atherosclerosis. As above, suspected pseudoaneurysm arising from the gastric duodenal artery anterior to the complex cystic mass in the pancreas. There is extrinsic compression of the portal vein, but no evidence of venous occlusion or thrombosis. Other: Generalized edema throughout the subcutaneous and mesenteric fat. Small amount of  ascites. Musculoskeletal: No acute or significant osseous findings. Lumbar spondylosis and scoliosis noted. IMPRESSION: 1. 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 Complex neoplasm less likely. Further evaluation with pancreatic protocol CT to include arterial phase images recommended as soon as possible. This may help determine need for endovascular treatment of presumed pseudoaneurysm. 2. 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 3. Anasarca with generalized soft tissue edema, ascites and bilateral pleural effusions. Electronically Signed   By: Richardean Sale M.D.   On: 11/09/2018 20:20   US Abdomen Complete  Result Date: 11/05/2018 CLINICAL DATA:  Pancreatitis. EXAM: ABDOMEN ULTRASOUND COMPLETE COMPARISON:  None. FINDINGS: Gallbladder: No gallstones or wall thickening visualized. No sonographic Murphy sign noted by sonographer. Common bile duct: Diameter: Dilated to 12-13 mm maximum. No visualized  duct stone. Liver: Increased echogenicity with a coarsened overall heterogeneous echotexture. No discrete mass focal lesion. Portal vein is patent on color Doppler imaging with normal direction of blood flow towards the liver. IVC: No abnormality visualized. 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 Mass measures 4.2 x 3.2 x 3.5 cm. Spleen: Size and appearance within normal limits. Right Kidney: Length: 10.8 cm. Normal parenchymal echogenicity. No mass, stone or hydronephrosis. Trace perinephric fluid. Left Kidney: Length: 11.4 cm. Normal parenchymal echogenicity. No mass, stone or hydronephrosis. Trace perinephric fluid. Abdominal aorta: No aneurysm visualized. Other findings: None. IMPRESSION: 1.  Apparent pancreatic or peripancreatic pseudoaneurysm as well as a 4.2 cm solid and cystic mass projecting at the pancreatic head. This may be an inflammatory mass given the reported history of pancreatitis. Follow-up of these findings is recommended with pancreatic MRI with and without contrast, or pancreatic protocol CT if the patient cannot tolerate MRI. 2. Appearance of the liver is consistent with hepatic steatosis. Consider a component of cirrhosis given the coarsened, heterogeneous echotexture. No liver mass or focal lesion. 3. No other acute abnormality or significant finding. Electronically Signed   By: Lajean Manes M.D.   On: 11/05/2018 15:09    Labs:  CBC: Recent Labs    11/05/18 1024  11/07/18 0437  11/08/18 2042 11/09/18 0455 11/09/18 1403 11/10/18 0517  WBC 5.4  --  5.0  --   --  4.2  --  3.6*  HGB 7.0*   < > 8.1*   < > 7.6* 8.0* 8.1* 7.4*  HCT 23.8*  --  25.3*  --   --  24.7*  --  22.9*  PLT 251  --  221  --   --  205  --  207   < > = values in this interval not displayed.    COAGS: No results for input(s): INR, APTT in the last 8760 hours.  BMP: Recent Labs    11/05/18 1024 11/07/18 0437 11/09/18 0455 11/10/18 0517  NA 135 134* 136 138  K 3.5 3.6 3.0* 3.8  CL 104 105 105 106  CO2 21* 23 24 24   GLUCOSE 141* 91 98 93  BUN 9 7 5* 5*  CALCIUM 8.1* 7.9* 7.7* 8.0*  CREATININE 0.77 0.70 0.82 0.86  GFRNONAA >60 >60 >60 >60  GFRAA >60 >60 >60 >60    LIVER FUNCTION TESTS: Recent Labs    11/05/18 1024 11/07/18 0437 11/09/18 0455 11/10/18 0517  BILITOT 0.9 1.1 1.3* 1.2  AST 27 17 14* 14*  ALT 26 19 14 13   ALKPHOS 53 45 57 70  PROT 5.4* 4.7* 4.9* 4.6*  ALBUMIN 3.3* 2.5* 2.3* 2.2*    TUMOR MARKERS: No results for input(s): AFPTM, CEA, CA199, CHROMGRNA in the last 8760 hours.  Assessment and Plan:  Patient with history of poorly controlled HTN and ongoing ETOH abuse who presented to [facility] ED on 11/05/18 with complaints of abdominal pain, hematemesis and  melena - he was found to be hemoccult positive with hgb of 7.0, he has received 3 units PRBCs since admission . He was admitted for further evaluation and GI was consulted. He underwent EGD/EUS on 11/06/18 which did not reveal a source of bleeding but did note a round hypoechoic mass in the pancreatic head which was unable to be biopsied due to anatomy, dilation of the middle and upper third of the main bile duct, he then underwent colonoscopy on 11/08/18 which did not  identify a source of active bleeding and subsequently capsule endoscopy was performed on 11/09/18 which again did not identify an active bleeding source.   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 Request was made to IR for emergent mesenteric angiogram with possible intervention.  Last PO intake around noon which was broth, not currently on blood thinning medications. Afebrile, WBC 3.6, hgb 7.4 - currently receiving 2 units PRBCs, plt 207, no current INR.  Risks and benefits of mesenteric angiogram with intervention were discussed with the patient including, but not limited to bleeding, infection, vascular injury, contrast induced renal failure, stroke or even death.  This interventional procedure involves the use of X-rays and because of the nature of the planned procedure, it is possible that we will have prolonged use of X-ray fluoroscopy. Potential radiation risks to you include (but are not limited to) the  following: - A slightly elevated risk for cancer  several years later in life. This risk is typically less than 0.5% percent. This risk is low in comparison to the normal incidence of human cancer, which is 33% for women and 50% for men according to the [employer]. - Radiation induced injury can include skin redness, resembling a rash, tissue breakdown / ulcers and hair loss (which can be temporary or permanent).  The likelihood of either of these occurring depends on the difficulty of the procedure and whether you are sensitive to radiation due to previous procedures, disease, or genetic conditions.  IF your procedure requires a prolonged use of radiation, you will be notified and given written instructions for further action.  It is your responsibility to monitor the irradiated area for the 2 weeks following the procedure and to notify your physician if you are concerned that you have suffered a radiation induced injury.    All of the patient's questions were answered, patient is agreeable to proceed.  Consent signed and in chart.   Thank you for this interesting consult.  I greatly enjoyed meeting Joe [name] and look forward to participating in their care.  A copy of this report was sent to the requesting provider on this date.  Electronically Signed: Joaquim Nam, PA-C 11/10/2018, 3:40 PM   I spent a total of 55 Mi[place]  in face to face in clinical consultation, greater than 50% of which was counseling/coordinating care for mesenteric angiogram with possible intervention.

## 2018-11-10 NOTE — Progress Notes (Signed)
PROGRESS NOTE  Joe [name]:[id number] DOB: [birth year] DOA: 11/05/2018 PCP: Mack Hook, MD   LOS: 5 days   Brief  Summary / Interim history: 58 year old with history only of hypertension came into the hospital on 1/2 with epigastric pain for the last 3 days along with bloody emesis and melena, no prior history of GI bleed in the past.  He reports drinking 1-2 beers a day and taking Aleve over the last few days due to abdominal pain.  His abdominal pain radiates into the back.  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 Benson Norway from GI service myself, patient, patient's significant other, as well as Dr. Earleen Newport from interventional radiology----presenting medical problems, work-up so far, and further work-up and diagnostic/therapeutic options discussed with patient and his significant other questions answered  Assessment & Plan: Active Problems:   GI bleed   Pancreatitis   Anemia   Malnutrition of moderate degree   Principal Problem GI bleed with acute blood loss anemia -Gastroenterology consulted, status post EGD1/3/20 which did not show any clear evidence of a source for bleed.    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 He underwent an EUS which showed pancreatic head mass, abd MRI and abdominal CT shows pseudoaneurysm please see brief summary section above -  .... Full liquid diet later today  Acute pancreatitis  abdominal pain improved, and lipase is was down to 71 from 117, continue to monitor, symptomatic treatment, pain control  Hypertension -Continue to hold losartan/HCTZ, blood pressure stable  Alcohol use -No evidence of DTs at this time, lorazepam per CIWA protocol, c/n  thiamine and folic acid as ordered   On 11/10/2018 Care conference at bedside with Dr. Benson Norway from GI service myself, patient, patient's significant other, as well as Dr. Earleen Newport from interventional radiology----presenting medical problems, work-up so far, and further work-up and diagnostic/therapeutic options discussed with patient and his significant other questions answered   Scheduled Meds: . feeding supplement (ENSURE ENLIVE)  237 mL Oral TID BM  . fentaNYL      . folic acid  1 mg Oral Daily  . furosemide  20 mg Intravenous Once  . lidocaine      . midazolam      . multivitamin with minerals  1 tablet Oral Daily  . pantoprazole (PROTONIX) IV  40 mg Intravenous Q12H  . thiamine  100 mg Oral Daily   Continuous Infusions: . sodium chloride 20 mL/hr at 11/10/18 1409  PRN Meds:.acetaminophen **OR** acetaminophen, cyclobenzaprine, hydrALAZINE, HYDROcodone-acetaminophen, morphine injection, ondansetron **OR** ondansetron (ZOFRAN) IV  DVT prophylaxis: SCDs Code Status: Full code Family Communication: wife at bedside Disposition  Plan: Home when cleared by GI/IR  Consultants:   Gastroenterology/IR  Procedures:  EGD Findings:      The esophagus was normal.      The stomach was normal.      The examined duodenum was normal. Impression:               - Normal esophagus.                           - Normal stomach.                           - Normal examined duodenum.                           - No specimens collected. Recommendation:           - Perform an upper endoscopic ultrasound (UEUS) Now.  EUS Impression:               - Ascites was found on endosonographic examination                            of the peritoneal cavity.                           - A mass was identified in the pancreatic head.                           - There was dilation in the middle third of the                            main bile duct and in the upper third of the main                            bile duct which measured up to 13 mm.                           - There was no sign of significant pathology in the                            gallbladder.                           - No specimens collected. Recommendation:           - MRI of the pancreas  Antimicrobials:  None    Objective: Vitals:   11/10/18 1745 11/10/18 1830 11/10/18 1845 11/10/18 1900  BP: 138/85 (!) 143/89 (!) 149/89 (!) 159/105  Pulse: 63     Resp: 14 16 14 16   Temp:  97.6 F (36.4 C) 97.8 F (36.6 C) (!) 97.3 F (36.3 C)  TempSrc:  Oral Oral Oral  SpO2: 100% 100% 100% 100%  Weight:      Height:        Intake/Output Summary (Last 24 hours) at 11/10/2018 1911 Last  data filed at 11/10/2018 1855 Gross per 24 hour  Intake 826.84 ml  Output -  Net 826.84 ml   Filed Weights   11/05/18 0958 11/05/18 1540  Weight: 73.9 kg 72 kg    Examination:  Physical Exam  Patient is examined daily including today on 11/10/18  , exams remain the same as of yesterday except that has changed   Gen:- Awake Alert, no acute distress HEENT:- [place].AT, No sclera  icterus Neck-Supple Neck,No JVD,.  Lungs-  CTAB , fair air movement CV- S1, S2 normal Abd-  +ve B.Sounds, Abd Soft, epigastric discomfort without rebound or guarding    Extremity/Skin:- No  edema,   good pulses Psych-affect is appropriate, oriented x3 Neuro-no new focal deficits, no tremors  CBC: Recent Labs  Lab 11/05/18 1024  11/07/18 0437  11/08/18 1234 11/08/18 2042 11/09/18 0455 11/09/18 1403 11/10/18 0517  WBC 5.4  --  5.0  --   --   --  4.2  --  3.6*  NEUTROABS 4.6  --   --   --   --   --   --   --   --   HGB 7.0*   < > 8.1*   < > 7.5* 7.6* 8.0* 8.1* 7.4*  HCT 23.8*  --  25.3*  --   --   --  24.7*  --  22.9*  MCV 80.4  --  79.3*  --   --   --  79.2*  --  79.0*  PLT 251  --  221  --   --   --  205  --  207   < > = values in this interval not displayed.   Basic Metabolic Panel: Recent Labs  Lab 11/05/18 1024 11/07/18 0437 11/09/18 0455 11/10/18 0517  NA 135 134* 136 138  K 3.5 3.6 3.0* 3.8  CL 104 105 105 106  CO2 21* 23 24 24   GLUCOSE 141* 91 98 93  BUN 9 7 5* 5*  CREATININE 0.77 0.70 0.82 0.86  CALCIUM 8.1* 7.9* 7.7* 8.0*   GFR: Estimated Creatinine Clearance: 96.5 mL/min (by C-G formula based on SCr of 0.86 mg/dL). Liver Function Tests: Recent Labs  Lab 11/05/18 1024 11/07/18 0437 11/09/18 0455 11/10/18 0517  AST 27 17 14* 14*  ALT 26 19 14 13   ALKPHOS 53 45 57 70  BILITOT 0.9 1.1 1.3* 1.2  PROT 5.4* 4.7* 4.9* 4.6*  ALBUMIN 3.3* 2.5* 2.3* 2.2*   Recent Labs  Lab 11/05/18 1024 11/06/18 0236  LIPASE 117* 73*   Urine analysis:    Component Value Date/Time   COLORURINE YELLOW 01/16/2017 [address] 01/16/2017 0942   LABSPEC 1.019 01/16/2017 0942   PHURINE 6.0 01/16/2017 0942   GLUCOSEU NEGATIVE 01/16/2017 0942   HGBUR NEGATIVE 01/16/2017 0942   BILIRUBINUR NEGATIVE 01/16/2017 0942   KETONESUR NEGATIVE 01/16/2017 0942   PROTEINUR NEGATIVE 01/16/2017 0942   NITRITE NEGATIVE 01/16/2017 0942   LEUKOCYTESUR NEGATIVE 01/16/2017  0942   Sepsis Labs:  Roxan Hockey, MD  [facility] If 7PM-7AM, please contact night-coverage [email or web address] Password [facility] 11/10/2018, 7:11 PM

## 2018-11-10 NOTE — Progress Notes (Signed)
Nutrition Follow-up  DOCUMENTATION CODES:   Non-severe (moderate) malnutrition in context of acute illness/injury  INTERVENTION:   - Continue MVI with minerals daily  - Ensure Enlive po TID, each supplement provides 350 kcal and 20 grams of protein  - d/c Boost Breeze  - Encourage adequate PO intake, RD placed dinner order per pt preferences  NUTRITION DIAGNOSIS:   Moderate Malnutrition related to acute illness (acute pancreatitis) as evidenced by mild fat depletion, mild muscle depletion.  New diagnosis  GOAL:   Patient will meet greater than or equal to 90% of their needs  Progressing  MONITOR:   PO intake, Supplement acceptance, Diet advancement, Weight trends  REASON FOR ASSESSMENT:   Malnutrition Screening Tool    ASSESSMENT:   58 yo male, admitted for surgery for GI bleed. PMH significant for HTN. No h/o GI bleed.  1/3 - s/p EGD which did not show any clear source of bleed, EUS showing pancreatic head mass 1/4 - s/p colonoscopy revealing large amount of blood in colon but no definite source of bleeding 1/5 - capsule endoscopy essentially unrevealing  Spoke with pt at bedside. RN at bedside providing nursing care.  Pt reports that he did okay with his lunch tray. Noted 100% completed cream of chicken soup and pudding. Pt also reports having some orange jello. Pt states that he is eager to eat solid food again. Pt denies any N/V at this time.  RD took pt's dinner order and placed it with [facility]. Ordered ice cream, pudding, jello, cream of chicken soup, Sprite, and apple juice.  Pt reports that he typically has a good appetite but that he has been losing weight without trying. Pt reports highest weight as 205 lbs which was several years ago when he was lifting weights. Pt reports weight loss down to his comfortable weight of 180-185 lbs but then started losing weight below this desired range. Unable to confirm this weight loss due to lack of information in chart. Pt  unsure why he is losing weight.  Weight history in chart shows 3.3 kg weight loss over the las 4 months. This is a 4.4% weight loss which is not significant for timeframe.  Pt shares that he is "on-the-go" and works daily.  Pt states that he has been drinking the [facility] and is willing to try Ensure Enlive due to higher kcal and protein content. RD provided pt with vanilla Ensure Enlive at time of visit which pt accepted.  Medications reviewed and include: Boost Breeze TID, folic acid daily, MVI with minerals, Protonix, thiamine, NS @ 20 ml/hr  Labs reviewed: hemoglobin 7.4 (L)  NUTRITION - FOCUSED PHYSICAL EXAM:    Most Recent Value  Orbital Region  Mild depletion  Upper Arm Region  Mild depletion  Thoracic and Lumbar Region  Mild depletion  Buccal Region  No depletion  Temple Region  No depletion  Clavicle Bone Region  Mild depletion  Clavicle and Acromion Bone Region  Moderate depletion  Scapular Bone Region  Mild depletion  Dorsal Hand  No depletion  Patellar Region  No depletion  Anterior Thigh Region  Mild depletion  Posterior Calf Region  No depletion  Edema (RD Assessment)  None  Hair  Reviewed  Eyes  Reviewed  Mouth  Reviewed  Skin  Reviewed  Nails  Reviewed      Diet Order:   Diet Order            Diet full liquid Room service appropriate? Yes; Fluid consistency:  Thin  Diet effective now              EDUCATION NEEDS:   Education needs have been addressed  Skin:  Skin Assessment: Reviewed RN Assessment  Last BM:  1/6  Height:   Ht Readings from Last 1 Encounters:  11/05/18 5\' 11"  (1.803 m)    Weight:   Wt Readings from Last 1 Encounters:  11/05/18 72 kg    Ideal Body Weight:  78.2 kg  BMI:  Body mass index is 22.13 kg/m.  Estimated Nutritional Needs:   Kcal:  1610-9604  Protein:  110-125 grams  Fluid:  >/= 2.1 L daily or per MD discretion    Gaynell Face, MS, RD, LDN Inpatient Clinical Dietitian Pager:  [phone] Weekend/After Hours: [phone]

## 2018-11-11 ENCOUNTER — Encounter (HOSPITAL_COMMUNITY): Payer: Self-pay | Admitting: Interventional Radiology

## 2018-11-11 DIAGNOSIS — I729 Aneurysm of unspecified site: Secondary | ICD-10-CM

## 2018-11-11 DIAGNOSIS — K8689 Other specified diseases of pancreas: Secondary | ICD-10-CM

## 2018-11-11 LAB — BPAM RBC
Blood Product Expiration Date: 202001132359
Blood Product Expiration Date: 202001132359
Blood Product Expiration Date: 202001272359
ISSUE DATE / TIME: 202001052215
ISSUE DATE / TIME: 202001071557
ISSUE DATE / TIME: 202001072017
UNIT TYPE AND RH: 6200
Unit Type and Rh: 600
Unit Type and Rh: 600

## 2018-11-11 LAB — TYPE AND SCREEN
ABO/RH(D): A POS
Antibody Screen: NEGATIVE
Unit division: 0
Unit division: 0
Unit division: 0

## 2018-11-11 LAB — COMPREHENSIVE METABOLIC PANEL
ALT: 13 U/L (ref 0–44)
AST: 15 U/L (ref 15–41)
Albumin: 2.2 g/dL — ABNORMAL LOW (ref 3.5–5.0)
Alkaline Phosphatase: 66 U/L (ref 38–126)
Anion gap: 5 (ref 5–15)
BUN: 5 mg/dL — ABNORMAL LOW (ref 6–20)
CO2: 28 mmol/L (ref 22–32)
Calcium: 8.1 mg/dL — ABNORMAL LOW (ref 8.9–10.3)
Chloride: 104 mmol/L (ref 98–111)
Creatinine, Ser: 0.81 mg/dL (ref 0.61–1.24)
GFR calc Af Amer: >60 mL/min (ref >60)
GFR calc non Af Amer: >60 mL/min (ref >60)
Glucose, Bld: 88 mg/dL (ref 70–99)
POTASSIUM: 3.5 mmol/L (ref 3.5–5.1)
Sodium: 137 mmol/L (ref 135–145)
Total Bilirubin: 1.6 mg/dL — ABNORMAL HIGH (ref 0.3–1.2)
Total Protein: 4.9 g/dL — ABNORMAL LOW (ref 6.5–8.1)

## 2018-11-11 LAB — CBC
HCT: 26.8 % — ABNORMAL LOW (ref 39.0–52.0)
Hemoglobin: 8.5 g/dL — ABNORMAL LOW (ref 13.0–17.0)
MCH: 25.1 pg — AB (ref 26.0–34.0)
MCHC: 31.7 g/dL (ref 30.0–36.0)
MCV: 79.3 fL — ABNORMAL LOW (ref 80.0–100.0)
Platelets: 213 10*3/uL (ref 150–400)
RBC: 3.38 MIL/uL — ABNORMAL LOW (ref 4.22–5.81)
RDW: 16.7 % — ABNORMAL HIGH (ref 11.5–15.5)
WBC: 3.7 10*3/uL — ABNORMAL LOW (ref 4.0–10.5)
nRBC: 0 % (ref 0.0–0.2)

## 2018-11-11 NOTE — Progress Notes (Signed)
Phone call received from [facility] that patient was in [facility]. Went to room patient was asymptomatic. Had him cough 2 times and the heart rate went back to SR. Dr. Wynelle Cleveland was paged and notified of episode.

## 2018-11-11 NOTE — Progress Notes (Addendum)
Called by RN for non-sustained "V tach". I have reviewed the strips which reveal that the rhythm was SVT and NOT Vtach. See full note.

## 2018-11-11 NOTE — Progress Notes (Addendum)
PROGRESS NOTE    Joe [name]   JGG:[id number]  DOB: September 02, 1961  DOA: 11/05/2018 PCP: Mack Hook, MD   Brief Narrative:  Joe [name] 58 year old with history only of hypertension came into the hospital on 1/2 with epigastric pain for the last 3 days along with bloody emesis and melena, no prior history of GI bleed in the past.  He reports drinking 1-2 beers a day and taking Aleve over the last few days due to abdominal pain.  His abdominal pain radiates into the back.  He was found to be anemic in the ED with a hemoglobin of 7 down from 11.9,  gastroenterology consulted, extensive GI work-up including EGD, colonoscopy and video capsule failed to demonstrate source of bleeding, also EUS fails to demonstrate any significant no abnormalities of the common bile duct apart from noted significant ductal dilatation, subsequently patient had abdominal MRI and CTs showing pseudoaneurysm, on 11/10/2018 had Mesenteric angiogram with Coil embolization of pseudoaneurysm arising from the GDA by IR   Subjective: He has mid abdominal pain. No bloody stool since yesterday.     Assessment & Plan:   Principal Problem:   GI bleed - with maroon stools - status post EGD1/3/20 which did not show any clear evidence of a source for bleed. - colonoscopy on 11/08/18 without acute findings -  capsule endoscop  11/08/2018- no source of bleeding found -1/7 > mesenteric angiogram-  s/p coiling of Gastroduodenal artery pseudoaneurysm   Active Problems: Acute blood loss anemia - has received 5 u PRBC so far- cont to follow Hb  Pancreatic mass/ acute pancreatitis - Abdominal ultrasound>  pancreatic head solid and cystic mass - - EUS > mass on pancreatic head, dilation in the middle third of themain bile duct and the upper third of the mainbile duct which measured up to 13 mm.    Hypertension - Losartan/HCTZ  on hold  SVT - follow on telemetry       Time spent in minutes: 35 DVT prophylaxis:  SCDs Code Status: Full code Family Communication:  Disposition Plan: per GI Consultants:   GI Procedures:   EGD, Colonoscopy, EUS, Capsule endoscopy  Mesenteric angiogram Antimicrobials:  Anti-infectives (From admission, onward)   None       Objective: Vitals:   11/11/18 0500 11/11/18 0600 11/11/18 0630 11/11/18 1349  BP: (!) 134/91 119/75 118/80 (!) 127/91  Pulse:    78  Resp:    16  Temp:    99 F (37.2 C)  TempSrc:    Oral  SpO2:    100%  Weight:      Height:        Intake/Output Summary (Last 24 hours) at 11/11/2018 1657 Last data filed at 11/10/2018 2303 Gross per 24 hour  Intake 599 ml  Output 1050 ml  Net -451 ml   Filed Weights   11/05/18 0958 11/05/18 1540  Weight: 73.9 kg 72 kg    Examination: General exam: Appears comfortable  HEENT: PERRLA, oral mucosa moist, no sclera icterus or thrush Respiratory system: Clear to auscultation. Respiratory effort normal. Cardiovascular system: S1 & S2 heard, RRR.   Gastrointestinal system: Abdomen soft, tender in mid abdomen, nondistended. Normal bowel sounds. Central nervous system: Alert and oriented. No focal neurological deficits. Extremities: No cyanosis, clubbing or edema Skin: No rashes or ulcers Psychiatry:  Mood & affect appropriate.     Data Reviewed: I have personally reviewed following labs and imaging studies  CBC: Recent Labs  Lab 11/05/18 1024  11/07/18 0437  11/08/18 2042 11/09/18 0455 11/09/18 1403 11/10/18 0517 11/11/18 0412  WBC 5.4  --  5.0  --   --  4.2  --  3.6* 3.7*  NEUTROABS 4.6  --   --   --   --   --   --   --   --   HGB 7.0*   < > 8.1*   < > 7.6* 8.0* 8.1* 7.4* 8.5*  HCT 23.8*  --  25.3*  --   --  24.7*  --  22.9* 26.8*  MCV 80.4  --  79.3*  --   --  79.2*  --  79.0* 79.3*  PLT 251  --  221  --   --  205  --  207 213   < > = values in this interval not displayed.   Basic Metabolic Panel: Recent Labs  Lab 11/05/18 1024 11/07/18 0437 11/09/18 0455 11/10/18 0517  11/11/18 0412  NA 135 134* 136 138 137  K 3.5 3.6 3.0* 3.8 3.5  CL 104 105 105 106 104  CO2 21* 23 24 24 28   GLUCOSE 141* 91 98 93 88  BUN 9 7 5* 5* 5*  CREATININE 0.77 0.70 0.82 0.86 0.81  CALCIUM 8.1* 7.9* 7.7* 8.0* 8.1*   GFR: Estimated Creatinine Clearance: 102.5 mL/min (by C-G formula based on SCr of 0.81 mg/dL). Liver Function Tests: Recent Labs  Lab 11/05/18 1024 11/07/18 0437 11/09/18 0455 11/10/18 0517 11/11/18 0412  AST 27 17 14* 14* 15  ALT 26 19 14 13 13   ALKPHOS 53 45 57 70 66  BILITOT 0.9 1.1 1.3* 1.2 1.6*  PROT 5.4* 4.7* 4.9* 4.6* 4.9*  ALBUMIN 3.3* 2.5* 2.3* 2.2* 2.2*   Recent Labs  Lab 11/05/18 1024 11/06/18 0236  LIPASE 117* 73*   No results for input(s): AMMONIA in the last 168 hours. Coagulation Profile: No results for input(s): INR, PROTIME in the last 168 hours. Cardiac Enzymes: No results for input(s): CKTOTAL, CKMB, CKMBINDEX, TROPONINI in the last 168 hours. BNP (last 3 results) No results for input(s): PROBNP in the last 8760 hours. HbA1C: No results for input(s): HGBA1C in the last 72 hours. CBG: No results for input(s): GLUCAP in the last 168 hours. Lipid Profile: No results for input(s): CHOL, HDL, LDLCALC, TRIG, CHOLHDL, LDLDIRECT in the last 72 hours. Thyroid Function Tests: No results for input(s): TSH, T4TOTAL, FREET4, T3FREE, THYROIDAB in the last 72 hours. Anemia Panel: No results for input(s): VITAMINB12, FOLATE, FERRITIN, TIBC, IRON, RETICCTPCT in the last 72 hours. Urine analysis:    Component Value Date/Time   COLORURINE YELLOW 01/16/2017 0942   APPEARANCEUR CLEAR 01/16/2017 0942   LABSPEC 1.019 01/16/2017 0942   PHURINE 6.0 01/16/2017 0942   GLUCOSEU NEGATIVE 01/16/2017 0942   HGBUR NEGATIVE 01/16/2017 0942   BILIRUBINUR NEGATIVE 01/16/2017 0942   KETONESUR NEGATIVE 01/16/2017 0942   PROTEINUR NEGATIVE 01/16/2017 0942   NITRITE NEGATIVE 01/16/2017 0942   LEUKOCYTESUR NEGATIVE 01/16/2017 0942   Sepsis  Labs: @LABRCNTIP (procalcitonin:4,lacticidven:4) )No results found for this or any previous visit (from the past 240 hour(s)).       Radiology Studies: Mr Abdomen W Wo Contrast  Result Date: 11/09/2018 CLINICAL DATA:  Epigastric pain with bloody vomiting and melena for 3 days. History of hypertension and pancreatitis. 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 EXAM: MRI ABDOMEN WITHOUT AND WITH CONTRAST TECHNIQUE: Multiplanar multisequence MR imaging of the abdomen was performed both before and after the administration of intravenous contrast. CONTRAST:  7 cc Gadavist  COMPARISON:  Abdominal ultrasound 11/05/2018. FINDINGS: Lower chest: Small bilateral pleural effusions with dependent atelectasis at both lung bases. Hepatobiliary: No morphologic changes of cirrhosis or significant hepatic steatosis. There is no focal hepatic lesion or abnormal enhancement following contrast. No evidence of gallstones or gallbladder wall thickening. There is moderate extrahepatic biliary dilatation with the common hepatic duct measuring up to 17 mm in diameter. The duct tapers distally and appears compressed by the process in the pancreatic head. No evidence of choledocholithiasis. Pancreas: There is prominent dilatation of the main pancreatic duct in the pancreatic body and tail, measuring up to 13 mm in diameter. There is associated side branch dilatation as well. There is a large complex, predominately cystic mass involving the pancreatic head. This demonstrates heterogeneous T2 signal, but predominantly homogeneous T1 signal prior to contrast administration. Following contrast, there is a homogeneous enhancing component anteriorly which follows blood pool, most likely a pseudoaneurysm based on previous ultrasound. This measures 15 x 25 mm on image 67/19 and 17 mm on coronal image 59/27. This likely arises from the gastric duodenal artery. The remainder of the mass is predominantly cystic, although does have thickened enhancing  septations. Overall dimensions of the mass are approximately 7.9 x 7.1 x 5.8 cm. Spleen: Normal in size without focal abnormality. Adrenals/Urinary Tract: Both adrenal glands appear normal. The kidneys and ureters appear normal. No hydronephrosis. Stomach/Bowel: Mild diffuse bowel wall thickening, especially in the right colon, likely related to ascites. No significant bowel distension or focal abnormality. Vascular/Lymphatic: There are no enlarged abdominal lymph nodes. Probable aortic and branch vessel atherosclerosis. As above, suspected pseudoaneurysm arising from the gastric duodenal artery anterior to the complex cystic mass in the pancreas. There is extrinsic compression of the portal vein, but no evidence of venous occlusion or thrombosis. Other: Generalized edema throughout the subcutaneous and mesenteric fat. Small amount of ascites. Musculoskeletal: No acute or significant osseous findings. Lumbar spondylosis and scoliosis noted. IMPRESSION: 1. 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 Complex neoplasm less likely. Further evaluation with pancreatic protocol CT to include arterial phase images recommended as soon as possible. This may help determine need for endovascular treatment of presumed pseudoaneurysm. 2. 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 3. Anasarca with generalized soft tissue edema, ascites and bilateral pleural effusions. Electronically Signed   By: Richardean Sale M.D.   On: 11/09/2018 20:20   Ir Angiogram Visceral Selective  Result Date: 11/11/2018 INDICATION: 58 year old male with a history abdominal pain, hematemesis, melena, with cross-sectional imaging demonstrating pseudoaneurysm of the GDA, uncertain etiology. He  presents for urgent angiogram and coil embolization. EXAM: ULTRASOUND GUIDED ACCESS RIGHT COMMON FEMORAL ARTERY MESENTERIC ANGIOGRAM COIL EMBOLIZATION OF GASTRODUODENAL ARTERY INCLUDING PSEUDOANEURYSM ARISING FROM THE GDA. CLOSURE OF RIGHT COMMON FEMORAL ARTERY WITH ANGIO-SEAL. MEDICATIONS: NONE ANESTHESIA/SEDATION: Moderate (conscious) sedation was employed during this procedure. A total of Versed 2.0 mg and Fentanyl 100 mcg was administered intravenously. Moderate Sedation Time: 56 minutes. The patient's level of consciousness and vital signs were monitored continuously by radiology nursing throughout the procedure under my direct supervision. CONTRAST:  80 cc FLUOROSCOPY TIME:  Fluoroscopy Time: 11 minutes 12 seconds (544 mGy). COMPLICATIONS: None PROCEDURE: Informed consent was obtained from the patient following explanation of the procedure, risks, benefits and alternatives. The patient understands, agrees and consents for the procedure. All questions  were addressed. A time out was performed prior to the initiation of the procedure. Maximal barrier sterile technique utilized including caps, mask, sterile gowns, sterile gloves, large sterile drape, hand hygiene, and Betadine prep. Ultrasound survey of the right inguinal region was performed with images stored and sent to PACs, confirming patency of the vessel. 1% lidocaine was used for local anesthesia. Stab incision was made with 11 blade scalpel and blunt dissection was performed with ultrasound guidance. A micropuncture needle was then used access the right common femoral artery under ultrasound. With excellent arterial blood flow returned, and an .018 micro wire was passed through the needle, observed enter the abdominal aorta under fluoroscopy. The needle was removed, and a micropuncture sheath was placed over the wire. The inner dilator and wire were removed, and an 035 Bentson wire was advanced under fluoroscopy into the abdominal aorta. The sheath was  removed and a standard 5 [place] vascular sheath was placed. The dilator was removed and the sheath was flushed. Cobra catheter was use elect the celiac artery access. Angiogram was performed. Glidewire was used to navigate the base catheter into the common hepatic artery. Angiogram was performed. A penumbra lantern 025 lumen microcatheter, straight tip, was then navigated with 014 fathom wire into the GDA origin and beyond the site of pseudoaneurysm into the gastroepiploic artery. Angiogram performed, confirming position. Coil embolization was then performed of the gastroepiploic artery beyond the pseudoaneurysm origin then withdrawing the microcatheter into the pseudoaneurysm. Catheter was carefully position within the central aspect of the filling pseudoaneurysm, and further coil embolization was performed with framing coils 20 mm x 60 mm both soft and standard. Microcatheter was then further withdrawn into the abnormal gastroduodenal artery with coil embolization performed with a combination of 3 mm and 4 mm standard/soft Ruby coils. Final angiogram was performed of the common hepatic artery. Angiogram was then performed of the superior mesenteric artery through the base catheter, with no residual contributing flow identified. Angio-Seal was then deployed at the right common femoral artery. Patient tolerated the procedure well and remained hemodynamically stable throughout. No complications were encountered and no significant blood loss. 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 Status post Angio-Seal for hemostasis. Signed, Dulcy Fanny. Dellia Nims, RPVI [facility] Radiology Electronically Signed   By: Corrie Mckusick D.O.    On: 11/11/2018 08:31   Ir Angiogram Visceral Selective  Result Date: 11/11/2018 INDICATION: 58 year old male with a history abdominal pain, hematemesis, melena, with cross-sectional imaging demonstrating pseudoaneurysm of the GDA, uncertain etiology. He presents for urgent angiogram and coil embolization. EXAM: ULTRASOUND GUIDED ACCESS RIGHT COMMON FEMORAL ARTERY MESENTERIC ANGIOGRAM COIL EMBOLIZATION OF GASTRODUODENAL ARTERY INCLUDING PSEUDOANEURYSM ARISING FROM THE GDA. CLOSURE OF RIGHT COMMON FEMORAL ARTERY WITH ANGIO-SEAL. MEDICATIONS: NONE ANESTHESIA/SEDATION: Moderate (conscious) sedation was employed during this procedure. A total of Versed 2.0 mg and Fentanyl 100 mcg was administered intravenously. Moderate Sedation Time: 56 minutes. The patient's level of consciousness and vital signs were monitored continuously by radiology nursing throughout the procedure under my direct supervision. CONTRAST:  80 cc FLUOROSCOPY TIME:  Fluoroscopy Time: 11 minutes 12 seconds (544 mGy). COMPLICATIONS: None PROCEDURE: Informed consent was obtained from the patient following explanation of the procedure, risks, benefits and alternatives. The patient understands, agrees and consents for the procedure.  All questions were addressed. A time out was performed prior to the initiation of the procedure. Maximal barrier sterile technique utilized including caps, mask, sterile gowns, sterile gloves, large sterile drape, hand hygiene, and Betadine prep. Ultrasound survey of the right inguinal region was performed with images stored and sent to PACs, confirming patency of the vessel. 1% lidocaine was used for local anesthesia. Stab incision was made with 11 blade scalpel and blunt dissection was performed with ultrasound guidance. A micropuncture needle was then used access the right common femoral artery under ultrasound. With excellent arterial blood flow returned, and an .018 micro wire was passed through the needle, observed  enter the abdominal aorta under fluoroscopy. The needle was removed, and a micropuncture sheath was placed over the wire. The inner dilator and wire were removed, and an 035 Bentson wire was advanced under fluoroscopy into the abdominal aorta. The sheath was removed and a standard 5 [place] vascular sheath was placed. The dilator was removed and the sheath was flushed. Cobra catheter was use elect the celiac artery access. Angiogram was performed. Glidewire was used to navigate the base catheter into the common hepatic artery. Angiogram was performed. A penumbra lantern 025 lumen microcatheter, straight tip, was then navigated with 014 fathom wire into the GDA origin and beyond the site of pseudoaneurysm into the gastroepiploic artery. Angiogram performed, confirming position. Coil embolization was then performed of the gastroepiploic artery beyond the pseudoaneurysm origin then withdrawing the microcatheter into the pseudoaneurysm. Catheter was carefully position within the central aspect of the filling pseudoaneurysm, and further coil embolization was performed with framing coils 20 mm x 60 mm both soft and standard. Microcatheter was then further withdrawn into the abnormal gastroduodenal artery with coil embolization performed with a combination of 3 mm and 4 mm standard/soft Ruby coils. Final angiogram was performed of the common hepatic artery. Angiogram was then performed of the superior mesenteric artery through the base catheter, with no residual contributing flow identified. Angio-Seal was then deployed at the right common femoral artery. Patient tolerated the procedure well and remained hemodynamically stable throughout. No complications were encountered and no significant blood loss. IMPRESSION: Status post ultrasound guided access of right common femoral artery for mesenteric angiogram and treatment of pseudoaneurysm arising from the gastroduodenal artery with coil embolization across the neck of the  pseudoaneurysm, into the pseudoaneurysm, and to the origin of the GDA. Status post Angio-Seal for hemostasis. Signed, Dulcy Fanny. Dellia Nims, RPVI [facility] Radiology Electronically Signed   By: Corrie Mckusick D.O.   On: 11/11/2018 08:31   Ir Angiogram Selective Each Additional Vessel  Result Date: 11/11/2018 INDICATION: 58 year old male with a history abdominal pain, hematemesis, melena, with cross-sectional imaging demonstrating pseudoaneurysm of the GDA, uncertain etiology. He presents for urgent angiogram and coil embolization. EXAM: ULTRASOUND GUIDED ACCESS RIGHT COMMON FEMORAL ARTERY MESENTERIC ANGIOGRAM COIL EMBOLIZATION OF GASTRODUODENAL ARTERY INCLUDING PSEUDOANEURYSM ARISING FROM THE GDA. CLOSURE OF RIGHT COMMON FEMORAL ARTERY WITH ANGIO-SEAL. MEDICATIONS: NONE ANESTHESIA/SEDATION: Moderate (conscious) sedation was employed during this procedure. A total of Versed 2.0 mg and Fentanyl 100 mcg was administered intravenously. Moderate Sedation Time: 56 minutes. The patient's level of consciousness and vital signs were monitored continuously by radiology nursing throughout the procedure under my direct supervision. CONTRAST:  80 cc FLUOROSCOPY TIME:  Fluoroscopy Time: 11 minutes 12 seconds (544 mGy). COMPLICATIONS: None PROCEDURE: Informed consent was obtained from the patient following explanation of the procedure, risks, benefits and alternatives. The patient understands, agrees and  consents for the procedure. All questions were addressed. A time out was performed prior to the initiation of the procedure. Maximal barrier sterile technique utilized including caps, mask, sterile gowns, sterile gloves, large sterile drape, hand hygiene, and Betadine prep. Ultrasound survey of the right inguinal region was performed with images stored and sent to PACs, confirming patency of the vessel. 1% lidocaine was used for local anesthesia. Stab incision was made with 11  blade scalpel and blunt dissection was performed with ultrasound guidance. A micropuncture needle was then used access the right common femoral artery under ultrasound. With excellent arterial blood flow returned, and an .018 micro wire was passed through the needle, observed enter the abdominal aorta under fluoroscopy. The needle was removed, and a micropuncture sheath was placed over the wire. The inner dilator and wire were removed, and an 035 Bentson wire was advanced under fluoroscopy into the abdominal aorta. The sheath was removed and a standard 5 [place] vascular sheath was placed. The dilator was removed and the sheath was flushed. Cobra catheter was use elect the celiac artery access. Angiogram was performed. Glidewire was used to navigate the base catheter into the common hepatic artery. Angiogram was performed. A penumbra lantern 025 lumen microcatheter, straight tip, was then navigated with 014 fathom wire into the GDA origin and beyond the site of pseudoaneurysm into the gastroepiploic artery. Angiogram performed, confirming position. Coil embolization was then performed of the gastroepiploic artery beyond the pseudoaneurysm origin then withdrawing the microcatheter into the pseudoaneurysm. Catheter was carefully position within the central aspect of the filling pseudoaneurysm, and further coil embolization was performed with framing coils 20 mm x 60 mm both soft and standard. Microcatheter was then further withdrawn into the abnormal gastroduodenal artery with coil embolization performed with a combination of 3 mm and 4 mm standard/soft Ruby coils. Final angiogram was performed of the common hepatic artery. Angiogram was then performed of the superior mesenteric artery through the base catheter, with no residual contributing flow identified. Angio-Seal was then deployed at the right common femoral artery. Patient tolerated the procedure well and remained hemodynamically stable throughout. No  complications were encountered and no significant blood loss. 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 Status post Angio-Seal for hemostasis. Signed, Dulcy Fanny. Dellia Nims, RPVI [facility] Radiology Electronically Signed   By: Corrie Mckusick D.O.   On: 11/11/2018 08:31   Ir US Guide Vasc Access Right  Result Date: 11/11/2018 INDICATION: 58 year old male with a history abdominal pain, hematemesis, melena, with cross-sectional imaging demonstrating pseudoaneurysm of the GDA, uncertain etiology. He presents for urgent angiogram and coil embolization. EXAM: ULTRASOUND GUIDED ACCESS RIGHT COMMON FEMORAL ARTERY MESENTERIC ANGIOGRAM COIL EMBOLIZATION OF GASTRODUODENAL ARTERY INCLUDING PSEUDOANEURYSM ARISING FROM THE GDA. CLOSURE OF RIGHT COMMON FEMORAL ARTERY WITH ANGIO-SEAL. MEDICATIONS: NONE ANESTHESIA/SEDATION: Moderate (conscious) sedation was employed during this procedure. A total of Versed 2.0 mg and Fentanyl 100 mcg was administered intravenously. Moderate Sedation Time: 56 minutes. The patient's level of consciousness and vital signs were monitored continuously by radiology nursing throughout the procedure under my direct supervision. CONTRAST:  80 cc FLUOROSCOPY TIME:  Fluoroscopy Time: 11 minutes 12 seconds (544 mGy). COMPLICATIONS: None PROCEDURE: Informed consent was obtained from the patient following explanation of the procedure, risks, benefits and alternatives. The  patient understands, agrees and consents for the procedure. All questions were addressed. A time out was performed prior to the initiation of the procedure. Maximal barrier sterile technique utilized including caps, mask, sterile gowns, sterile  gloves, large sterile drape, hand hygiene, and Betadine prep. Ultrasound survey of the right inguinal region was performed with images stored and sent to PACs, confirming patency of the vessel. 1% lidocaine was used for local anesthesia. Stab incision was made with 11 blade scalpel and blunt dissection was performed with ultrasound guidance. A micropuncture needle was then used access the right common femoral artery under ultrasound. With excellent arterial blood flow returned, and an .018 micro wire was passed through the needle, observed enter the abdominal aorta under fluoroscopy. The needle was removed, and a micropuncture sheath was placed over the wire. The inner dilator and wire were removed, and an 035 Bentson wire was advanced under fluoroscopy into the abdominal aorta. The sheath was removed and a standard 5 [place] vascular sheath was placed. The dilator was removed and the sheath was flushed. Cobra catheter was use elect the celiac artery access. Angiogram was performed. Glidewire was used to navigate the base catheter into the common hepatic artery. Angiogram was performed. A penumbra lantern 025 lumen microcatheter, straight tip, was then navigated with 014 fathom wire into the GDA origin and beyond the site of pseudoaneurysm into the gastroepiploic artery. Angiogram performed, confirming position. Coil embolization was then performed of the gastroepiploic artery beyond the pseudoaneurysm origin then withdrawing the microcatheter into the pseudoaneurysm. Catheter was carefully position within the central aspect of the filling pseudoaneurysm, and further coil embolization was performed with framing coils 20 mm x 60 mm both soft and standard. Microcatheter was then further withdrawn into the abnormal gastroduodenal artery with coil embolization performed with a combination of 3 mm and 4 mm standard/soft Ruby coils. Final angiogram was performed of the common hepatic artery. Angiogram was then performed of  the superior mesenteric artery through the base catheter, with no residual contributing flow identified. Angio-Seal was then deployed at the right common femoral artery. Patient tolerated the procedure well and remained hemodynamically stable throughout. No complications were encountered and no significant blood loss. 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 Status post Angio-Seal for hemostasis. Signed, Dulcy Fanny. Dellia Nims, RPVI [facility] Radiology Electronically Signed   By: Corrie Mckusick D.O.   On: 11/11/2018 08:31   Ct Pancreas Abd W/wo  Result Date: 11/10/2018 CLINICAL DATA:  58 year old male with a history melena, prior hematemesis and pancreatitis. Prior MRI 11/09/2018 suggests complex mass of the pancreatic head as well as a pseudoaneurysm. EXAM: CT ABDOMEN WITHOUT AND WITH CONTRAST TECHNIQUE: Multidetector CT imaging of the abdomen was performed following the standard protocol before and following the bolus administration of intravenous contrast. CONTRAST:  <See Chart> ISOVUE-370 IOPAMIDOL (ISOVUE-370) INJECTION 76% COMPARISON:  MRI 11/09/2018 FINDINGS: Lower chest: Atelectatic changes at the right lung base. Trace pleural effusions bilateral lower lungs. Hepatobiliary: Delayed CT images demonstrate uniform enhancement/attenuation of the liver parenchyma with the early phase imaging patchy attenuation likely related to flow phenomenon. There is trace intrahepatic ductal dilatation of both right and left-sided ducts. Gallbladder somewhat distended with no radiopaque stones within the gallbladder lumen. Trace pericholecystic fluid without evidence of gallbladder wall  thickening. Ascites adjacent to the liver  in the subdiaphragmatic space extends below the liver within the pericolic gutter. Ill-defined heterogeneous attenuation mass continuous with the pancreatic head extends towards the gastrohepatic ligament in the liver hilum. Questionable portal caval nodes, inseparable from the caudate (image 46 of series 9). Pancreas: CT demonstrates pancreatic ductal dilatation involving the tail the pancreas and body of the pancreas, with the dilatation cutting off at heterogeneous soft tissue mass involving the pancreatic head (image 7 of series 14). At this same level there is abrupt termination/cutoff of the common bile duct. (Image 7 of series 14). Heterogeneous soft tissue mass at this location represents a pseudoaneurysm measuring at least 6.8 cm in AP dimension on image 9 of series 14. There is near complete thrombosis of the pseudoaneurysm, with flow maintained anteriorly. The mass in the head of the pancreas compresses the portal vein, just beyond the confluence with superior mesenteric vein to a thread of contrast on image 59 of series 9. More superiorly there is heterogeneous hypoattenuating bilobed mass just superior to the common hepatic artery measuring 6.4 cm by 2.8 cm. This mass appear to have cystic configuration on the prior MRI with internal septations. Ill-defined inflammatory changes surrounding the body and tail of the pancreas, with haziness of the adjacent fat. Spleen: Greatest dimension of spleen 13 cm on the axial images. Adrenals/Urinary Tract: Unremarkable appearance of the adrenal glands. No evidence of hydronephrosis of the right or left kidney. No nephrolithiasis. Unremarkable course of the bilateral ureters. Unremarkable appearance of the urinary bladder. Stomach/Bowel: Unremarkable appearance of stomach. Inflammatory changes of the upper abdomen are continuous with the proximal duodenum. No evidence of gastric outlet obstruction. Small bowel is decompressed with no focal wall thickening. Normal  appendix. The cecum is unremarkable without significant distension. Inflammatory changes of the upper abdomen are continuous with the hepatic flexure. Colonic diverticular present of the transverse colon, splenic flexure, sigmoid colon. Vascular/Lymphatic: No significant atherosclerotic changes. Small lymph nodes in the retroperitoneum. Low-density ascites in the dependent pelvis as well as adjacent to the liver. Portal vein is compressed just beyond the superior mesenteric vein confluence. No portal vein thrombus is identified. There is significant collateral formation of portal to portal collaterals via the gastroepiploic vasculature and at the hilum of the liver. Unremarkable appearance of the iliac veins. Other: Trace abdominal wall edema. e. Musculoskeletal: No acute displaced fracture. 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 This termination of the pancreatic duct occurs at a similar termination of the common bile duct, both are which are compressed/obstructed by heterogeneously attenuating/enhancing mass. The current CT resolution does not differentiate whether the mass effect is from the partially thrombosed pseudoaneurysm, or if there is a superimposed primary pancreatic mass at the pancreatic head. Preliminary results were discussed by telephone at the time of interpretation on 11/10/2018 at about 3 p.m. with Dr. Carol Ada , who verbally acknowledged these results. 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 Repeat cross-sectional imaging with CT or MRI may be useful once the pseudoaneurysm has been treated in order to increase specificity of findings in  the pancreatic head, as neoplasm is not excluded. 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 Questionable pathologic adenopathy in the portacaval nodal stations and liver hilum, as well as adjacent to the second portion of the duodenum, increasing the suspicion of occult malignancy. Trace ascites. Mild body wall edema/anasarca with bilateral pleural effusions. Electronically Signed   By: Corrie Mckusick D.O.   On: 11/10/2018 16:19   [place] Guide Roadmapping  Result Date: 11/11/2018 INDICATION: 58 year old male with a history abdominal pain, hematemesis, melena, with cross-sectional imaging demonstrating pseudoaneurysm of the GDA, uncertain etiology. He presents for urgent angiogram and coil embolization. EXAM: ULTRASOUND GUIDED ACCESS RIGHT COMMON FEMORAL ARTERY MESENTERIC ANGIOGRAM COIL EMBOLIZATION OF GASTRODUODENAL ARTERY INCLUDING PSEUDOANEURYSM ARISING FROM THE GDA. CLOSURE OF RIGHT COMMON FEMORAL ARTERY WITH ANGIO-SEAL. MEDICATIONS: NONE ANESTHESIA/SEDATION: Moderate (conscious) sedation was employed during this procedure. A total of Versed 2.0 mg and Fentanyl 100 mcg was administered intravenously. Moderate Sedation Time: 56 minutes. The patient's level of consciousness and vital signs were monitored continuously by radiology nursing throughout the procedure under my direct supervision. CONTRAST:  80 cc FLUOROSCOPY TIME:  Fluoroscopy Time: 11 minutes 12 seconds (544 mGy). COMPLICATIONS: None PROCEDURE: Informed consent was obtained from the patient following explanation of the procedure, risks, benefits and alternatives. The patient understands, agrees and consents for the procedure. All questions were addressed. A time out was performed prior to the initiation of the procedure. Maximal barrier sterile technique utilized including caps, mask,  sterile gowns, sterile gloves, large sterile drape, hand hygiene, and Betadine prep. Ultrasound survey of the right inguinal region was performed with images stored and sent to PACs, confirming patency of the vessel. 1% lidocaine was used for local anesthesia. Stab incision was made with 11 blade scalpel and blunt dissection was performed with ultrasound guidance. A micropuncture needle was then used access the right common femoral artery under ultrasound. With excellent arterial blood flow returned, and an .018 micro wire was passed through the needle, observed enter the abdominal aorta under fluoroscopy. The needle was removed, and a micropuncture sheath was placed over the wire. The inner dilator and wire were removed, and an 035 Bentson wire was advanced under fluoroscopy into the abdominal aorta. The sheath was removed and a standard 5 [place] vascular sheath was placed. The dilator was removed and the sheath was flushed. Cobra catheter was use elect the celiac artery access. Angiogram was performed. Glidewire was used to navigate the base catheter into the common hepatic artery. Angiogram was performed. A penumbra lantern 025 lumen microcatheter, straight tip, was then navigated with 014 fathom wire into the GDA origin and beyond the site of pseudoaneurysm into the gastroepiploic artery. Angiogram performed, confirming position. Coil embolization was then performed of the gastroepiploic artery beyond the pseudoaneurysm origin then withdrawing the microcatheter into the pseudoaneurysm. Catheter was carefully position within the central aspect of the filling pseudoaneurysm, and further coil embolization was performed with framing coils 20 mm x 60 mm both soft and standard. Microcatheter was then further withdrawn into the abnormal gastroduodenal artery with coil embolization performed with a combination of 3 mm and 4 mm standard/soft Ruby coils. Final angiogram was performed of the common hepatic artery.  Angiogram was then performed of the superior mesenteric artery through the base catheter, with no residual contributing flow identified. Angio-Seal was then deployed at the right common femoral artery. Patient tolerated the procedure well and remained hemodynamically stable throughout. No complications were encountered and no significant blood loss. IMPRESSION: Status post ultrasound guided access  of right common femoral artery for mesenteric angiogram and treatment of pseudoaneurysm arising from the gastroduodenal artery with coil embolization across the neck of the pseudoaneurysm, into the pseudoaneurysm, and to the origin of the GDA. Status post Angio-Seal for hemostasis. Signed, Dulcy Fanny. Dellia Nims, RPVI [facility] Radiology Electronically Signed   By: Corrie Mckusick D.O.   On: 11/11/2018 08:31      Scheduled Meds: . feeding supplement (ENSURE ENLIVE)  237 mL Oral TID BM  . folic acid  1 mg Oral Daily  . multivitamin with minerals  1 tablet Oral Daily  . pantoprazole (PROTONIX) IV  40 mg Intravenous Q12H  . thiamine  100 mg Oral Daily   Continuous Infusions: . sodium chloride 20 mL/hr at 11/10/18 1409     LOS: 6 days      Debbe Odea, MD [facility] Pager: [email or web address] Password [facility] 11/11/2018, 4:57 PM

## 2018-11-11 NOTE — Progress Notes (Signed)
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 No immediate complications from procedure. Pt feeling okay this am. A little sore. Trying to drink some liquids. No further hematemesis.   Objective: Physical Exam: BP 118/80 (BP Location: Left Arm)   Pulse 63   Temp 98 F (36.7 C)   Resp 16   Ht 5\' 11"  (1.803 m)   Wt 72 kg   SpO2 100%   BMI 22.13 kg/m  A&O x 3 Abd: soft, ND, NT Ext: (R)groin soft, no hematoma, mild tenderness. Leg warm, excellent pedal pulse   Current Facility-Administered Medications:  .  0.9 %  sodium chloride infusion, , Intravenous, Continuous, Emokpae, Courage, MD, Last Rate: 20 mL/hr at 11/10/18 1409 .  acetaminophen (TYLENOL) tablet 650 mg, 650 mg, Oral, Q6H PRN **OR** acetaminophen (TYLENOL) suppository 650 mg, 650 mg, Rectal, Q6H PRN, Cirigliano, Vito V, DO .  cyclobenzaprine (FLEXERIL) tablet 5 mg, 5 mg, Oral, TID PRN, Cirigliano, Vito V, DO .  feeding supplement (ENSURE ENLIVE) (ENSURE ENLIVE) liquid 237 mL, 237 mL, Oral, TID BM, Emokpae, Courage, MD, 237 mL at 11/10/18 2000 .  folic acid (FOLVITE) tablet 1 mg, 1 mg, Oral, Daily, Cirigliano, Vito V, DO, 1 mg at 11/10/18 0910 .  hydrALAZINE (APRESOLINE) injection 2 mg, 2 mg, Intravenous, Q4H PRN, Cirigliano, Vito V, DO .  HYDROcodone-acetaminophen (NORCO/VICODIN) 5-325 MG per tablet 1-2 tablet, 1-2 tablet, Oral, Q4H PRN, Cirigliano, Vito V, DO, 2 tablet at 11/11/18 0439 .  morphine 4 MG/ML injection 4 mg, 4 mg, Intravenous, Q4H PRN, Cirigliano, Vito V, DO, 4 mg at 11/09/18 0519 .  multivitamin with minerals tablet 1 tablet, 1 tablet, Oral, Daily, Cirigliano, Vito V, DO, 1 tablet at 11/10/18 0910 .  ondansetron (ZOFRAN) tablet 4 mg, 4 mg, Oral, Q6H PRN **OR** ondansetron (ZOFRAN) injection 4 mg, 4 mg, Intravenous,  Q6H PRN, Cirigliano, Vito V, DO .  pantoprazole (PROTONIX) injection 40 mg, 40 mg, Intravenous, Q12H, Cirigliano, Vito V, DO, 40 mg at 11/10/18 2150 .  thiamine (VITAMIN B-1) tablet 100 mg, 100 mg, Oral, Daily, Cirigliano, Vito V, DO, 100 mg at 11/10/18 0910  Labs: CBC Recent Labs    11/10/18 0517 11/11/18 0412  WBC 3.6* 3.7*  HGB 7.4* 8.5*  HCT 22.9* 26.8*  PLT 207 213   BMET Recent Labs    11/10/18 0517 11/11/18 0412  NA 138 137  K 3.8 3.5  CL 106 104  CO2 24 28  GLUCOSE 93 88  BUN 5* 5*  CREATININE 0.86 0.81  CALCIUM 8.0* 8.1*   LFT Recent Labs    11/11/18 0412  PROT 4.9*  ALBUMIN 2.2*  AST 15  ALT 13  ALKPHOS 66  BILITOT 1.6*   PT/INR No results for input(s): LABPROT, INR in the last 72 hours.   Studies/Results: Mr Abdomen W Wo Contrast  Result Date: 11/09/2018 CLINICAL DATA:  Epigastric pain with bloody vomiting and melena for 3 days. History of hypertension and pancreatitis. 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 EXAM: MRI ABDOMEN WITHOUT AND WITH CONTRAST TECHNIQUE: Multiplanar multisequence MR imaging of the abdomen was performed both before and after the administration of intravenous contrast. CONTRAST:  7 cc Gadavist COMPARISON:  Abdominal ultrasound 11/05/2018. FINDINGS: Lower chest: Small bilateral pleural effusions with dependent atelectasis at both lung bases. Hepatobiliary: No morphologic changes of cirrhosis or significant hepatic  steatosis. There is no focal hepatic lesion or abnormal enhancement following contrast. No evidence of gallstones or gallbladder wall thickening. There is moderate extrahepatic biliary dilatation with the common hepatic duct measuring up to 17 mm in diameter. The duct tapers distally and appears compressed by the process in the pancreatic head. No evidence of choledocholithiasis. Pancreas: There is prominent dilatation of the main pancreatic duct in the pancreatic body and tail, measuring up to 13 mm in diameter. There is associated  side branch dilatation as well. There is a large complex, predominately cystic mass involving the pancreatic head. This demonstrates heterogeneous T2 signal, but predominantly homogeneous T1 signal prior to contrast administration. Following contrast, there is a homogeneous enhancing component anteriorly which follows blood pool, most likely a pseudoaneurysm based on previous ultrasound. This measures 15 x 25 mm on image 67/19 and 17 mm on coronal image 59/27. This likely arises from the gastric duodenal artery. The remainder of the mass is predominantly cystic, although does have thickened enhancing septations. Overall dimensions of the mass are approximately 7.9 x 7.1 x 5.8 cm. Spleen: Normal in size without focal abnormality. Adrenals/Urinary Tract: Both adrenal glands appear normal. The kidneys and ureters appear normal. No hydronephrosis. Stomach/Bowel: Mild diffuse bowel wall thickening, especially in the right colon, likely related to ascites. No significant bowel distension or focal abnormality. Vascular/Lymphatic: There are no enlarged abdominal lymph nodes. Probable aortic and branch vessel atherosclerosis. As above, suspected pseudoaneurysm arising from the gastric duodenal artery anterior to the complex cystic mass in the pancreas. There is extrinsic compression of the portal vein, but no evidence of venous occlusion or thrombosis. Other: Generalized edema throughout the subcutaneous and mesenteric fat. Small amount of ascites. Musculoskeletal: No acute or significant osseous findings. Lumbar spondylosis and scoliosis noted. IMPRESSION: 1. 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 Complex neoplasm less likely. Further evaluation with pancreatic protocol CT to include arterial phase images recommended as  soon as possible. This may help determine need for endovascular treatment of presumed pseudoaneurysm. 2. 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 3. Anasarca with generalized soft tissue edema, ascites and bilateral pleural effusions. Electronically Signed   By: Richardean Sale M.D.   On: 11/09/2018 20:20   Ir Angiogram Visceral Selective  Result Date: 11/11/2018 INDICATION: 58 year old male with a history abdominal pain, hematemesis, melena, with cross-sectional imaging demonstrating pseudoaneurysm of the GDA, uncertain etiology. He presents for urgent angiogram and coil embolization. EXAM: ULTRASOUND GUIDED ACCESS RIGHT COMMON FEMORAL ARTERY MESENTERIC ANGIOGRAM COIL EMBOLIZATION OF GASTRODUODENAL ARTERY INCLUDING PSEUDOANEURYSM ARISING FROM THE GDA. CLOSURE OF RIGHT COMMON FEMORAL ARTERY WITH ANGIO-SEAL. MEDICATIONS: NONE ANESTHESIA/SEDATION: Moderate (conscious) sedation was employed during this procedure. A total of Versed 2.0 mg and Fentanyl 100 mcg was administered intravenously. Moderate Sedation Time: 56 minutes. The patient's level of consciousness and vital signs were monitored continuously by radiology nursing throughout the procedure under my direct supervision. CONTRAST:  80 cc FLUOROSCOPY TIME:  Fluoroscopy Time: 11 minutes 12 seconds (544 mGy). COMPLICATIONS: None PROCEDURE: Informed consent was obtained from the patient following explanation of the procedure, risks, benefits and alternatives. The patient understands, agrees and consents for the procedure. All questions were addressed. A time out was performed prior to the initiation of the procedure. Maximal barrier sterile technique utilized including caps, mask, sterile gowns, sterile gloves, large sterile  drape, hand hygiene, and Betadine prep. Ultrasound survey of the right inguinal region was performed with images stored and sent to PACs, confirming patency of the vessel. 1%  lidocaine was used for local anesthesia. Stab incision was made with 11 blade scalpel and blunt dissection was performed with ultrasound guidance. A micropuncture needle was then used access the right common femoral artery under ultrasound. With excellent arterial blood flow returned, and an .018 micro wire was passed through the needle, observed enter the abdominal aorta under fluoroscopy. The needle was removed, and a micropuncture sheath was placed over the wire. The inner dilator and wire were removed, and an 035 Bentson wire was advanced under fluoroscopy into the abdominal aorta. The sheath was removed and a standard 5 [place] vascular sheath was placed. The dilator was removed and the sheath was flushed. Cobra catheter was use elect the celiac artery access. Angiogram was performed. Glidewire was used to navigate the base catheter into the common hepatic artery. Angiogram was performed. A penumbra lantern 025 lumen microcatheter, straight tip, was then navigated with 014 fathom wire into the GDA origin and beyond the site of pseudoaneurysm into the gastroepiploic artery. Angiogram performed, confirming position. Coil embolization was then performed of the gastroepiploic artery beyond the pseudoaneurysm origin then withdrawing the microcatheter into the pseudoaneurysm. Catheter was carefully position within the central aspect of the filling pseudoaneurysm, and further coil embolization was performed with framing coils 20 mm x 60 mm both soft and standard. Microcatheter was then further withdrawn into the abnormal gastroduodenal artery with coil embolization performed with a combination of 3 mm and 4 mm standard/soft Ruby coils. Final angiogram was performed of the common hepatic artery. Angiogram was then performed of the superior mesenteric artery through the base catheter, with no residual contributing flow identified. Angio-Seal was then deployed at the right common femoral artery. Patient tolerated the  procedure well and remained hemodynamically stable throughout. No complications were encountered and no significant blood loss. 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 Status post Angio-Seal for hemostasis. Signed, Dulcy Fanny. Dellia Nims, RPVI [facility] Electronically Signed   By: Corrie Mckusick D.O.   On: 11/11/2018 08:31   Ir Angiogram Visceral Selective  Result Date: 11/11/2018 INDICATION: 58 year old male with a history abdominal pain, hematemesis, melena, with cross-sectional imaging demonstrating pseudoaneurysm of the GDA, uncertain etiology. He presents for urgent angiogram and coil embolization. EXAM: ULTRASOUND GUIDED ACCESS RIGHT COMMON FEMORAL ARTERY MESENTERIC ANGIOGRAM COIL EMBOLIZATION OF GASTRODUODENAL ARTERY INCLUDING PSEUDOANEURYSM ARISING FROM THE GDA. CLOSURE OF RIGHT COMMON FEMORAL ARTERY WITH ANGIO-SEAL. MEDICATIONS: NONE ANESTHESIA/SEDATION: Moderate (conscious) sedation was employed during this procedure. A total of Versed 2.0 mg and Fentanyl 100 mcg was administered intravenously. Moderate Sedation Time: 56 minutes. The patient's level of consciousness and vital signs were monitored continuously by radiology nursing throughout the procedure under my direct supervision. CONTRAST:  80 cc FLUOROSCOPY TIME:  Fluoroscopy Time: 11 minutes 12 seconds (544 mGy). COMPLICATIONS: None PROCEDURE: Informed consent was obtained from the patient following explanation of the procedure, risks, benefits and alternatives. The patient understands, agrees and consents for the procedure. All questions were addressed. A time out was performed prior to the initiation of the procedure. Maximal barrier sterile  technique utilized including caps, mask, sterile gowns, sterile  gloves, large sterile drape, hand hygiene, and Betadine prep. Ultrasound survey of the right inguinal region was performed with images stored and sent to PACs, confirming patency of the vessel. 1% lidocaine was used for local anesthesia. Stab incision was made with 11 blade scalpel and blunt dissection was performed with ultrasound guidance. A micropuncture needle was then used access the right common femoral artery under ultrasound. With excellent arterial blood flow returned, and an .018 micro wire was passed through the needle, observed enter the abdominal aorta under fluoroscopy. The needle was removed, and a micropuncture sheath was placed over the wire. The inner dilator and wire were removed, and an 035 Bentson wire was advanced under fluoroscopy into the abdominal aorta. The sheath was removed and a standard 5 [place] vascular sheath was placed. The dilator was removed and the sheath was flushed. Cobra catheter was use elect the celiac artery access. Angiogram was performed. Glidewire was used to navigate the base catheter into the common hepatic artery. Angiogram was performed. A penumbra lantern 025 lumen microcatheter, straight tip, was then navigated with 014 fathom wire into the GDA origin and beyond the site of pseudoaneurysm into the gastroepiploic artery. Angiogram performed, confirming position. Coil embolization was then performed of the gastroepiploic artery beyond the pseudoaneurysm origin then withdrawing the microcatheter into the pseudoaneurysm. Catheter was carefully position within the central aspect of the filling pseudoaneurysm, and further coil embolization was performed with framing coils 20 mm x 60 mm both soft and standard. Microcatheter was then further withdrawn into the abnormal gastroduodenal artery with coil embolization performed with a combination of 3 mm and 4 mm standard/soft Ruby coils. Final angiogram was  performed of the common hepatic artery. Angiogram was then performed of the superior mesenteric artery through the base catheter, with no residual contributing flow identified. Angio-Seal was then deployed at the right common femoral artery. Patient tolerated the procedure well and remained hemodynamically stable throughout. No complications were encountered and no significant blood loss. 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 Status post Angio-Seal for hemostasis. Signed, Dulcy Fanny. Dellia Nims, RPVI [facility] Electronically Signed   By: Corrie Mckusick D.O.   On: 11/11/2018 08:31   Ir Angiogram Selective Each Additional Vessel  Result Date: 11/11/2018 INDICATION: 58 year old male with a history abdominal pain, hematemesis, melena, with cross-sectional imaging demonstrating pseudoaneurysm of the GDA, uncertain etiology. He presents for urgent angiogram and coil embolization. EXAM: ULTRASOUND GUIDED ACCESS RIGHT COMMON FEMORAL ARTERY MESENTERIC ANGIOGRAM COIL EMBOLIZATION OF GASTRODUODENAL ARTERY INCLUDING PSEUDOANEURYSM ARISING FROM THE GDA. CLOSURE OF RIGHT COMMON FEMORAL ARTERY WITH ANGIO-SEAL. MEDICATIONS: NONE ANESTHESIA/SEDATION: Moderate (conscious) sedation was employed during this procedure. A total of Versed 2.0 mg and Fentanyl 100 mcg was administered intravenously. Moderate Sedation Time: 56 minutes. The patient's level of consciousness and vital signs were monitored continuously by radiology nursing throughout the procedure under my direct supervision. CONTRAST:  80 cc FLUOROSCOPY TIME:  Fluoroscopy Time: 11 minutes 12 seconds (544 mGy). COMPLICATIONS: None PROCEDURE: Informed consent was  obtained from the patient following explanation of the procedure, risks, benefits and alternatives. The patient understands, agrees and consents for the procedure. All questions were addressed. A time out was performed prior to the initiation of the procedure. Maximal barrier sterile technique utilized including caps,  mask, sterile gowns, sterile gloves, large sterile drape, hand hygiene, and Betadine prep. Ultrasound survey of the right inguinal region was performed with images stored and sent to PACs, confirming patency of the vessel. 1% lidocaine was used for local anesthesia. Stab incision was made with 11 blade scalpel and blunt dissection was performed with ultrasound guidance. A micropuncture needle was then used access the right common femoral artery under ultrasound. With excellent arterial blood flow returned, and an .018 micro wire was passed through the needle, observed enter the abdominal aorta under fluoroscopy. The needle was removed, and a micropuncture sheath was placed over the wire. The inner dilator and wire were removed, and an 035 Bentson wire was advanced under fluoroscopy into the abdominal aorta. The sheath was removed and a standard 5 [place] vascular sheath was placed. The dilator was removed and the sheath was flushed. Cobra catheter was use elect the celiac artery access. Angiogram was performed. Glidewire was used to navigate the base catheter into the common hepatic artery. Angiogram was performed. A penumbra lantern 025 lumen microcatheter, straight tip, was then navigated with 014 fathom wire into the GDA origin and beyond the site of pseudoaneurysm into the gastroepiploic artery. Angiogram performed, confirming position. Coil embolization was then performed of the gastroepiploic artery beyond the pseudoaneurysm origin then withdrawing the microcatheter into the pseudoaneurysm. Catheter was carefully position within the central aspect of the filling pseudoaneurysm, and further coil  embolization was performed with framing coils 20 mm x 60 mm both soft and standard. Microcatheter was then further withdrawn into the abnormal gastroduodenal artery with coil embolization performed with a combination of 3 mm and 4 mm standard/soft Ruby coils. Final angiogram was performed of the common hepatic artery. Angiogram was then performed of the superior mesenteric artery through the base catheter, with no residual contributing flow identified. Angio-Seal was then deployed at the right common femoral artery. Patient tolerated the procedure well and remained hemodynamically stable throughout. No complications were encountered and no significant blood loss. 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 Status post Angio-Seal for hemostasis. Signed, Dulcy Fanny. Dellia Nims, RPVI [facility] Radiology Electronically Signed   By: Corrie Mckusick D.O.   On: 11/11/2018 08:31   Ir US Guide Vasc Access Right  Result Date: 11/11/2018 INDICATION: 58 year old male with a history abdominal pain, hematemesis, melena, with cross-sectional imaging demonstrating pseudoaneurysm of the GDA, uncertain etiology. He presents for urgent angiogram and coil embolization. EXAM: ULTRASOUND GUIDED ACCESS RIGHT COMMON FEMORAL ARTERY MESENTERIC ANGIOGRAM COIL EMBOLIZATION OF GASTRODUODENAL ARTERY INCLUDING PSEUDOANEURYSM ARISING FROM THE GDA. CLOSURE OF RIGHT COMMON FEMORAL ARTERY WITH ANGIO-SEAL. MEDICATIONS: NONE ANESTHESIA/SEDATION: Moderate (conscious) sedation was employed during this procedure. A total of Versed 2.0 mg and Fentanyl 100 mcg was administered intravenously. Moderate Sedation Time: 56 minutes. The patient's level of consciousness  and vital signs were monitored continuously by radiology nursing throughout the procedure under my direct supervision. CONTRAST:  80 cc FLUOROSCOPY TIME:  Fluoroscopy Time: 11 minutes 12 seconds (544 mGy). COMPLICATIONS: None PROCEDURE: Informed consent was obtained from the patient following explanation of the procedure, risks, benefits and alternatives. The patient understands, agrees and consents for the procedure. All questions were addressed. A time out was performed prior to the initiation of the procedure. Maximal barrier sterile  technique utilized including caps, mask, sterile gowns, sterile gloves, large sterile drape, hand hygiene, and Betadine prep. Ultrasound survey of the right inguinal region was performed with images stored and sent to PACs, confirming patency of the vessel. 1% lidocaine was used for local anesthesia. Stab incision was made with 11 blade scalpel and blunt dissection was performed with ultrasound guidance. A micropuncture needle was then used access the right common femoral artery under ultrasound. With excellent arterial blood flow returned, and an .018 micro wire was passed through the needle, observed enter the abdominal aorta under fluoroscopy. The needle was removed, and a micropuncture sheath was placed over the wire. The inner dilator and wire were removed, and an 035 Bentson wire was advanced under fluoroscopy into the abdominal aorta. The sheath was removed and a standard 5 [place] vascular sheath was placed. The dilator was removed and the sheath was flushed. Cobra catheter was use elect the celiac artery access. Angiogram was performed. Glidewire was used to navigate the base catheter into the common hepatic artery. Angiogram was performed. A penumbra lantern 025 lumen microcatheter, straight tip, was then navigated with 014 fathom wire into the GDA origin and beyond the site of pseudoaneurysm into the gastroepiploic artery. Angiogram performed, confirming position. Coil  embolization was then performed of the gastroepiploic artery beyond the pseudoaneurysm origin then withdrawing the microcatheter into the pseudoaneurysm. Catheter was carefully position within the central aspect of the filling pseudoaneurysm, and further coil embolization was performed with framing coils 20 mm x 60 mm both soft and standard. Microcatheter was then further withdrawn into the abnormal gastroduodenal artery with coil embolization performed with a combination of 3 mm and 4 mm standard/soft Ruby coils. Final angiogram was performed of the common hepatic artery. Angiogram was then performed of the superior mesenteric artery through the base catheter, with no residual contributing flow identified. Angio-Seal was then deployed at the right common femoral artery. Patient tolerated the procedure well and remained hemodynamically stable throughout. No complications were encountered and no significant blood loss. 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 Status post Angio-Seal for hemostasis. Signed, Dulcy Fanny. Dellia Nims, RPVI [facility] Electronically Signed   By: Corrie Mckusick D.O.   On: 11/11/2018 08:31   Ct Pancreas Abd W/wo  Result Date: 11/10/2018 CLINICAL DATA:  58 year old male with a history melena, prior hematemesis and pancreatitis. Prior MRI 11/09/2018 suggests complex mass of the pancreatic head as well as a pseudoaneurysm. EXAM: CT ABDOMEN WITHOUT AND WITH CONTRAST TECHNIQUE: Multidetector CT imaging of the abdomen was performed following the standard protocol before and following the bolus administration of intravenous contrast. CONTRAST:  <See Chart> [id number] IOPAMIDOL ([id number])  INJECTION 76% COMPARISON:  MRI 11/09/2018 FINDINGS: Lower chest: Atelectatic changes at the right lung base. Trace pleural effusions bilateral lower lungs. Hepatobiliary: Delayed CT images demonstrate uniform enhancement/attenuation of the liver parenchyma with the early phase imaging patchy attenuation likely related to flow phenomenon. There is trace intrahepatic ductal dilatation of both right and left-sided ducts. Gallbladder somewhat distended with no radiopaque stones within the gallbladder lumen. Trace pericholecystic fluid without evidence of gallbladder wall thickening. Ascites adjacent to the liver in the subdiaphragmatic space extends below the liver within the pericolic gutter. Ill-defined heterogeneous attenuation mass continuous with the pancreatic head  extends towards the gastrohepatic ligament in the liver hilum. Questionable portal caval nodes, inseparable from the caudate (image 46 of series 9). Pancreas: CT demonstrates pancreatic ductal dilatation involving the tail the pancreas and body of the pancreas, with the dilatation cutting off at heterogeneous soft tissue mass involving the pancreatic head (image 7 of series 14). At this same level there is abrupt termination/cutoff of the common bile duct. (Image 7 of series 14). Heterogeneous soft tissue mass at this location represents a pseudoaneurysm measuring at least 6.8 cm in AP dimension on image 9 of series 14. There is near complete thrombosis of the pseudoaneurysm, with flow maintained anteriorly. The mass in the head of the pancreas compresses the portal vein, just beyond the confluence with superior mesenteric vein to a thread of contrast on image 59 of series 9. More superiorly there is heterogeneous hypoattenuating bilobed mass just superior to the common hepatic artery measuring 6.4 cm by 2.8 cm. This mass appear to have cystic configuration on the prior MRI with internal septations. Ill-defined inflammatory changes surrounding the body  and tail of the pancreas, with haziness of the adjacent fat. Spleen: Greatest dimension of spleen 13 cm on the axial images. Adrenals/Urinary Tract: Unremarkable appearance of the adrenal glands. No evidence of hydronephrosis of the right or left kidney. No nephrolithiasis. Unremarkable course of the bilateral ureters. Unremarkable appearance of the urinary bladder. Stomach/Bowel: Unremarkable appearance of stomach. Inflammatory changes of the upper abdomen are continuous with the proximal duodenum. No evidence of gastric outlet obstruction. Small bowel is decompressed with no focal wall thickening. Normal appendix. The cecum is unremarkable without significant distension. Inflammatory changes of the upper abdomen are continuous with the hepatic flexure. Colonic diverticular present of the transverse colon, splenic flexure, sigmoid colon. Vascular/Lymphatic: No significant atherosclerotic changes. Small lymph nodes in the retroperitoneum. Low-density ascites in the dependent pelvis as well as adjacent to the liver. Portal vein is compressed just beyond the superior mesenteric vein confluence. No portal vein thrombus is identified. There is significant collateral formation of portal to portal collaterals via the gastroepiploic vasculature and at the hilum of the liver. Unremarkable appearance of the iliac veins. Other: Trace abdominal wall edema. e. Musculoskeletal: No acute displaced fracture. 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 This termination of the pancreatic duct occurs at a similar termination of the common bile duct, both are which are compressed/obstructed by heterogeneously attenuating/enhancing mass. The current CT resolution does not differentiate whether the mass effect is from the partially thrombosed  pseudoaneurysm, or if there is a superimposed primary pancreatic mass at the pancreatic head. Preliminary results were discussed by telephone at the time of interpretation on 11/10/2018 at about 3 p.m. with Dr. Carol Ada , who verbally acknowledged these results. 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 Repeat cross-sectional imaging with CT or MRI may be useful once the pseudoaneurysm has been treated in order to increase specificity of findings in the pancreatic head, as neoplasm is not excluded. 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 Questionable pathologic adenopathy in the portacaval nodal stations and liver hilum, as well as adjacent to the second portion of the duodenum,  increasing the suspicion of occult malignancy. Trace ascites. Mild body wall edema/anasarca with bilateral pleural effusions. Electronically Signed   By: Corrie Mckusick D.O.   On: 11/10/2018 16:19   [place] [employer]  Result Date: 11/11/2018 INDICATION: 58 year old male with a history abdominal pain, hematemesis, melena, with cross-sectional imaging demonstrating pseudoaneurysm of the GDA, uncertain etiology. He presents for urgent angiogram and coil embolization. EXAM: ULTRASOUND GUIDED ACCESS RIGHT COMMON FEMORAL ARTERY MESENTERIC ANGIOGRAM COIL EMBOLIZATION OF GASTRODUODENAL ARTERY INCLUDING PSEUDOANEURYSM ARISING FROM THE GDA. CLOSURE OF RIGHT COMMON FEMORAL ARTERY WITH ANGIO-SEAL. MEDICATIONS: NONE ANESTHESIA/SEDATION: Moderate (conscious) sedation was employed during this procedure. A total of Versed 2.0 mg and Fentanyl 100 mcg was administered intravenously. Moderate Sedation Time: 56 minutes. The  patient's level of consciousness and vital signs were monitored continuously by radiology nursing throughout the procedure under my direct supervision. CONTRAST:  80 cc FLUOROSCOPY TIME:  Fluoroscopy Time: 11 minutes 12 seconds (544 mGy). COMPLICATIONS: None PROCEDURE: Informed consent was obtained from the patient following explanation of the procedure, risks, benefits and alternatives. The patient understands, agrees and consents for the procedure. All questions were addressed. A time out was performed prior to the initiation of the procedure. Maximal barrier sterile technique utilized including caps, mask, sterile gowns, sterile gloves, large sterile drape, hand hygiene, and Betadine prep. Ultrasound survey of the right inguinal region was performed with images stored and sent to PACs, confirming patency of the vessel. 1% lidocaine was used for local anesthesia. Stab incision was made with 11 blade scalpel and blunt dissection was performed with ultrasound guidance. A micropuncture needle was then used access the right common femoral artery under ultrasound. With excellent arterial blood flow returned, and an .018 micro wire was passed through the needle, observed enter the abdominal aorta under fluoroscopy. The needle was removed, and a micropuncture sheath was placed over the wire. The inner dilator and wire were removed, and an 035 Bentson wire was advanced under fluoroscopy into the abdominal aorta. The sheath was removed and a standard 5 [place] vascular sheath was placed. The dilator was removed and the sheath was flushed. Cobra catheter was use elect the celiac artery access. Angiogram was performed. Glidewire was used to navigate the base catheter into the common hepatic artery. Angiogram was performed. A penumbra lantern 025 lumen microcatheter, straight tip, was then navigated with 014 fathom wire into the GDA origin and beyond the site of pseudoaneurysm into the gastroepiploic artery. Angiogram  performed, confirming position. Coil embolization was then performed of the gastroepiploic artery beyond the pseudoaneurysm origin then withdrawing the microcatheter into the pseudoaneurysm. Catheter was carefully position within the central aspect of the filling pseudoaneurysm, and further coil embolization was performed with framing coils 20 mm x 60 mm both soft and standard. Microcatheter was then further withdrawn into the abnormal gastroduodenal artery with coil embolization performed with a combination of 3 mm and 4 mm standard/soft Ruby coils. Final angiogram was performed of the common hepatic artery. Angiogram was then performed of the superior mesenteric artery through the base catheter, with no residual contributing flow identified. Angio-Seal was then deployed at the right common femoral artery. Patient tolerated the procedure well and remained hemodynamically stable throughout. No complications were encountered and no significant blood loss. 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 Status post Angio-Seal for hemostasis. Signed, Dulcy Fanny. Dellia Nims, RPVI [facility] Electronically Signed   By: Corrie Mckusick D.O.   On: 11/11/2018 08:31    Assessment/Plan: S/p visceral angio with extensive coil embo of GDA including large pseudoaneurysm Hemodynamically stable. Hgb stable, Cr stable IR following    LOS: 6 days   I spent a total of 15 minutes in face to face in clinical consultation, greater than 50% of which was counseling/coordinating care for visceral angio/embo  Ascencion Dike PA-C 11/11/2018 9:51 AM

## 2018-11-11 NOTE — Progress Notes (Signed)
UNASSIGNED PATIENT Subjective: Since I last evaluated the patient, he's had a mesenteric angiogram and coiling of the gastroduodenal artery but continues to have some rectal bleeding. He complains of severe epigastric pain but denies having any nausea vomiting. He claims he had a bowel movement about an hour ago and there was small amount of bright red blood in the stool. He denies having any melenic stools.  Objective: Vital signs in last 24 hours: Temp:  [97.3 F (36.3 C)-99 F (37.2 C)] 99 F (37.2 C) (01/08 1349) Pulse Rate:  [63-78] 78 (01/08 1349) Resp:  [11-20] 16 (01/08 1349) BP: (107-159)/(75-105) 127/91 (01/08 1349) SpO2:  [100 %] 100 % (01/08 1349) Last BM Date: 11/10/18  Intake/Output from previous day: 01/07 0701 - 01/08 0700 In: 1110.8 [I.V.:481.8; Blood:629] Out: 4097 [Urine:1050] Intake/Output this shift: No intake/output data recorded.  General appearance: alert, cooperative, appears stated age and mild distress Resp: clear to auscultation bilaterally Cardio: regular rate and rhythm, S1, S2 normal, no murmur, click, rub or gallop GI: soft with epigastric tenderness on palpation with gaurding but without rebound or rigidity; ; bowel sounds normal; no masses,  no organomegaly  Lab Results: Recent Labs    11/09/18 0455 11/09/18 1403 11/10/18 0517 11/11/18 0412  WBC 4.2  --  3.6* 3.7*  HGB 8.0* 8.1* 7.4* 8.5*  HCT 24.7*  --  22.9* 26.8*  PLT 205  --  207 213   BMET Recent Labs    11/09/18 0455 11/10/18 0517 11/11/18 0412  NA 136 138 137  K 3.0* 3.8 3.5  CL 105 106 104  CO2 24 24 28   GLUCOSE 98 93 88  BUN 5* 5* 5*  CREATININE 0.82 0.86 0.81  CALCIUM 7.7* 8.0* 8.1*   LFT Recent Labs    11/11/18 0412  PROT 4.9*  ALBUMIN 2.2*  AST 15  ALT 13  ALKPHOS 66  BILITOT 1.6*   Studies/Results: Mr Abdomen W Wo Contrast  Result Date: 11/09/2018 CLINICAL DATA:  Epigastric pain with bloody vomiting and melena for 3 days. History of hypertension and  pancreatitis. 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 EXAM: MRI ABDOMEN WITHOUT AND WITH CONTRAST TECHNIQUE: Multiplanar multisequence MR imaging of the abdomen was performed both before and after the administration of intravenous contrast. CONTRAST:  7 cc Gadavist COMPARISON:  Abdominal ultrasound 11/05/2018. FINDINGS: Lower chest: Small bilateral pleural effusions with dependent atelectasis at both lung bases. Hepatobiliary: No morphologic changes of cirrhosis or significant hepatic steatosis. There is no focal hepatic lesion or abnormal enhancement following contrast. No evidence of gallstones or gallbladder wall thickening. There is moderate extrahepatic biliary dilatation with the common hepatic duct measuring up to 17 mm in diameter. The duct tapers distally and appears compressed by the process in the pancreatic head. No evidence of choledocholithiasis. Pancreas: There is prominent dilatation of the main pancreatic duct in the pancreatic body and tail, measuring up to 13 mm in diameter. There is associated side branch dilatation as well. There is a large complex, predominately cystic mass involving the pancreatic head. This demonstrates heterogeneous T2 signal, but predominantly homogeneous T1 signal prior to contrast administration. Following contrast, there is a homogeneous enhancing component anteriorly which follows blood pool, most likely a pseudoaneurysm based on previous ultrasound. This measures 15 x 25 mm on image 67/19 and 17 mm on coronal image 59/27. This likely arises from the gastric duodenal artery. The remainder of the mass is predominantly cystic, although does have thickened enhancing septations. Overall dimensions of the mass are approximately 7.9 x  7.1 x 5.8 cm. Spleen: Normal in size without focal abnormality. Adrenals/Urinary Tract: Both adrenal glands appear normal. The kidneys and ureters appear normal. No hydronephrosis. Stomach/Bowel: Mild diffuse bowel wall thickening, especially in the  right colon, likely related to ascites. No significant bowel distension or focal abnormality. Vascular/Lymphatic: There are no enlarged abdominal lymph nodes. Probable aortic and branch vessel atherosclerosis. As above, suspected pseudoaneurysm arising from the gastric duodenal artery anterior to the complex cystic mass in the pancreas. There is extrinsic compression of the portal vein, but no evidence of venous occlusion or thrombosis. Other: Generalized edema throughout the subcutaneous and mesenteric fat. Small amount of ascites. Musculoskeletal: No acute or significant osseous findings. Lumbar spondylosis and scoliosis noted. IMPRESSION: 1. 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 Complex neoplasm less likely. Further evaluation with pancreatic protocol CT to include arterial phase images recommended as soon as possible. This may help determine need for endovascular treatment of presumed pseudoaneurysm. 2. 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 3. Anasarca with generalized soft tissue edema, ascites and bilateral pleural effusions. Electronically Signed   By: Richardean Sale M.D.   On: 11/09/2018 20:20   Ir Angiogram Visceral Selective  Result Date: 11/11/2018 INDICATION: 58 year old male with a history abdominal pain, hematemesis, melena, with cross-sectional imaging demonstrating pseudoaneurysm of the GDA, uncertain etiology. He presents for urgent angiogram and coil embolization. EXAM: ULTRASOUND GUIDED ACCESS RIGHT COMMON FEMORAL ARTERY MESENTERIC ANGIOGRAM COIL EMBOLIZATION OF GASTRODUODENAL ARTERY INCLUDING PSEUDOANEURYSM ARISING FROM THE GDA. CLOSURE OF RIGHT COMMON FEMORAL ARTERY WITH ANGIO-SEAL. MEDICATIONS: NONE ANESTHESIA/SEDATION:  Moderate (conscious) sedation was employed during this procedure. A total of Versed 2.0 mg and Fentanyl 100 mcg was administered intravenously. Moderate Sedation Time: 56 minutes. The patient's level of consciousness and vital signs were monitored continuously by radiology nursing throughout the procedure under my direct supervision. CONTRAST:  80 cc FLUOROSCOPY TIME:  Fluoroscopy Time: 11 minutes 12 seconds (544 mGy). COMPLICATIONS: None PROCEDURE: Informed consent was obtained from the patient following explanation of the procedure, risks, benefits and alternatives. The patient understands, agrees and consents for the procedure. All questions were addressed. A time out was performed prior to the initiation of the procedure. Maximal barrier sterile technique utilized including caps, mask, sterile gowns, sterile gloves, large sterile drape, hand hygiene, and Betadine prep. Ultrasound survey of the right inguinal region was performed with images stored and sent to PACs, confirming patency of the vessel. 1% lidocaine was used for local anesthesia. Stab incision was made with 11 blade scalpel and blunt dissection was performed with ultrasound guidance. A micropuncture needle was then used access the right common femoral artery under ultrasound. With excellent arterial blood flow returned, and an .018 micro wire was passed through the needle, observed enter the abdominal aorta under fluoroscopy. The needle was removed, and a micropuncture sheath was placed over the wire. The inner dilator and wire were removed, and an 035 Bentson wire was advanced under fluoroscopy into the abdominal aorta. The sheath was removed and a standard 5 [place] vascular sheath was placed. The dilator was removed and the sheath was flushed. Cobra catheter was use elect the celiac artery access. Angiogram was performed. Glidewire was used to navigate the base catheter into the common hepatic artery. Angiogram was performed. A penumbra lantern 025  lumen microcatheter, straight tip, was then  navigated with 014 fathom wire into the GDA origin and beyond the site of pseudoaneurysm into the gastroepiploic artery. Angiogram performed, confirming position. Coil embolization was then performed of the gastroepiploic artery beyond the pseudoaneurysm origin then withdrawing the microcatheter into the pseudoaneurysm. Catheter was carefully position within the central aspect of the filling pseudoaneurysm, and further coil embolization was performed with framing coils 20 mm x 60 mm both soft and standard. Microcatheter was then further withdrawn into the abnormal gastroduodenal artery with coil embolization performed with a combination of 3 mm and 4 mm standard/soft Ruby coils. Final angiogram was performed of the common hepatic artery. Angiogram was then performed of the superior mesenteric artery through the base catheter, with no residual contributing flow identified. Angio-Seal was then deployed at the right common femoral artery. Patient tolerated the procedure well and remained hemodynamically stable throughout. No complications were encountered and no significant blood loss. 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 Status post Angio-Seal for hemostasis. Signed, Dulcy Fanny. Dellia Nims, RPVI [facility] Radiology Electronically Signed   By: Corrie Mckusick D.O.   On: 11/11/2018 08:31   Ir Angiogram Visceral Selective  Result Date: 11/11/2018 INDICATION: 58 year old male with a history abdominal pain, hematemesis, melena, with cross-sectional imaging demonstrating pseudoaneurysm of the GDA, uncertain etiology. He presents for urgent angiogram and coil embolization. EXAM:  ULTRASOUND GUIDED ACCESS RIGHT COMMON FEMORAL ARTERY MESENTERIC ANGIOGRAM COIL EMBOLIZATION OF GASTRODUODENAL ARTERY INCLUDING PSEUDOANEURYSM ARISING FROM THE GDA. CLOSURE OF RIGHT COMMON FEMORAL ARTERY WITH ANGIO-SEAL. MEDICATIONS: NONE ANESTHESIA/SEDATION: Moderate (conscious) sedation was employed during this procedure. A total of Versed 2.0 mg and Fentanyl 100 mcg was administered intravenously. Moderate Sedation Time: 56 minutes. The patient's level of consciousness and vital signs were monitored continuously by radiology nursing throughout the procedure under my direct supervision. CONTRAST:  80 cc FLUOROSCOPY TIME:  Fluoroscopy Time: 11 minutes 12 seconds (544 mGy). COMPLICATIONS: None PROCEDURE: Informed consent was obtained from the patient following explanation of the procedure, risks, benefits and alternatives. The patient understands, agrees and consents for the procedure. All questions were addressed. A time out was performed prior to the initiation of the procedure. Maximal barrier sterile technique utilized including caps, mask, sterile gowns, sterile gloves, large sterile drape, hand hygiene, and Betadine prep. Ultrasound survey of the right inguinal region was performed with images stored and sent to PACs, confirming patency of the vessel. 1% lidocaine was used for local anesthesia. Stab incision was made with 11 blade scalpel and blunt dissection was performed with ultrasound guidance. A micropuncture needle was then used access the right common femoral artery under ultrasound. With excellent arterial blood flow returned, and an .018 micro wire was passed through the needle, observed enter the abdominal aorta under fluoroscopy. The needle was removed, and a micropuncture sheath was placed over the wire. The inner dilator and wire were removed, and an 035 Bentson wire was advanced under fluoroscopy into the abdominal aorta. The sheath was removed and a standard 5 [place] vascular sheath was placed.  The dilator was removed and the sheath was flushed. Cobra catheter was use elect the celiac artery access. Angiogram was performed. Glidewire was used to navigate the base catheter into the common hepatic artery. Angiogram was performed. A penumbra lantern 025 lumen microcatheter, straight tip,  was then navigated with 014 fathom wire into the GDA origin and beyond the site of pseudoaneurysm into the gastroepiploic artery. Angiogram performed, confirming position. Coil embolization was then performed of the gastroepiploic artery beyond the pseudoaneurysm origin then withdrawing the microcatheter into the pseudoaneurysm. Catheter was carefully position within the central aspect of the filling pseudoaneurysm, and further coil embolization was performed with framing coils 20 mm x 60 mm both soft and standard. Microcatheter was then further withdrawn into the abnormal gastroduodenal artery with coil embolization performed with a combination of 3 mm and 4 mm standard/soft Ruby coils. Final angiogram was performed of the common hepatic artery. Angiogram was then performed of the superior mesenteric artery through the base catheter, with no residual contributing flow identified. Angio-Seal was then deployed at the right common femoral artery. Patient tolerated the procedure well and remained hemodynamically stable throughout. No complications were encountered and no significant blood loss. 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 Status post Angio-Seal for hemostasis. Signed, Dulcy Fanny. Dellia Nims, RPVI Vascular and Interventional Radiology Specialists [facility] Radiology Electronically Signed   By: Corrie Mckusick D.O.   On: 11/11/2018 08:31   Ir Angiogram Selective Each  Additional Vessel  Result Date: 11/11/2018 INDICATION: 58 year old male with a history abdominal pain, hematemesis, melena, with cross-sectional imaging demonstrating pseudoaneurysm of the GDA, uncertain etiology. He presents for urgent angiogram and coil embolization. EXAM: ULTRASOUND GUIDED ACCESS RIGHT COMMON FEMORAL ARTERY MESENTERIC ANGIOGRAM COIL EMBOLIZATION OF GASTRODUODENAL ARTERY INCLUDING PSEUDOANEURYSM ARISING FROM THE GDA. CLOSURE OF RIGHT COMMON FEMORAL ARTERY WITH ANGIO-SEAL. MEDICATIONS: NONE ANESTHESIA/SEDATION: Moderate (conscious) sedation was employed during this procedure. A total of Versed 2.0 mg and Fentanyl 100 mcg was administered intravenously. Moderate Sedation Time: 56 minutes. The patient's level of consciousness and vital signs were monitored continuously by radiology nursing throughout the procedure under my direct supervision. CONTRAST:  80 cc FLUOROSCOPY TIME:  Fluoroscopy Time: 11 minutes 12 seconds (544 mGy). COMPLICATIONS: None PROCEDURE: Informed consent was obtained from the patient following explanation of the procedure, risks, benefits and alternatives. The patient understands, agrees and consents for the procedure. All questions were addressed. A time out was performed prior to the initiation of the procedure. Maximal barrier sterile technique utilized including caps, mask, sterile gowns, sterile gloves, large sterile drape, hand hygiene, and Betadine prep. Ultrasound survey of the right inguinal region was performed with images stored and sent to PACs, confirming patency of the vessel. 1% lidocaine was used for local anesthesia. Stab incision was made with 11 blade scalpel and blunt dissection was performed with ultrasound guidance. A micropuncture needle was then used access the right common femoral artery under ultrasound. With excellent arterial blood flow returned, and an .018 micro wire was passed through the needle, observed enter the abdominal aorta under  fluoroscopy. The needle was removed, and a micropuncture sheath was placed over the wire. The inner dilator and wire were removed, and an 035 Bentson wire was advanced under fluoroscopy into the abdominal aorta. The sheath was removed and a standard 5 [place] vascular sheath was placed. The dilator was removed and the sheath was flushed. Cobra catheter was use elect the celiac artery access. Angiogram was performed. Glidewire was used to navigate the base catheter into the common hepatic artery. Angiogram was performed. A penumbra lantern 025  lumen microcatheter, straight tip, was then navigated with 014 fathom wire into the GDA origin and beyond the site of pseudoaneurysm into the gastroepiploic artery. Angiogram performed, confirming position. Coil embolization was then performed of the gastroepiploic artery beyond the pseudoaneurysm origin then withdrawing the microcatheter into the pseudoaneurysm. Catheter was carefully position within the central aspect of the filling pseudoaneurysm, and further coil embolization was performed with framing coils 20 mm x 60 mm both soft and standard. Microcatheter was then further withdrawn into the abnormal gastroduodenal artery with coil embolization performed with a combination of 3 mm and 4 mm standard/soft Ruby coils. Final angiogram was performed of the common hepatic artery. Angiogram was then performed of the superior mesenteric artery through the base catheter, with no residual contributing flow identified. Angio-Seal was then deployed at the right common femoral artery. Patient tolerated the procedure well and remained hemodynamically stable throughout. No complications were encountered and no significant blood loss. 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 Status post Angio-Seal for hemostasis. Signed, Dulcy Fanny. Dellia Nims, RPVI Vascular and Interventional Radiology Specialists [facility] Radiology Electronically Signed   By: Corrie Mckusick D.O.   On: 11/11/2018 08:31   Ir US Guide Vasc Access Right  Result Date: 11/11/2018 INDICATION: 58 year old male with a history abdominal pain, hematemesis, melena, with cross-sectional imaging demonstrating pseudoaneurysm of the GDA, uncertain etiology. He presents for urgent angiogram and coil embolization. EXAM: ULTRASOUND GUIDED ACCESS RIGHT COMMON FEMORAL ARTERY MESENTERIC ANGIOGRAM COIL EMBOLIZATION OF GASTRODUODENAL ARTERY INCLUDING PSEUDOANEURYSM ARISING FROM THE GDA. CLOSURE OF RIGHT COMMON FEMORAL ARTERY WITH ANGIO-SEAL. MEDICATIONS: NONE ANESTHESIA/SEDATION: Moderate (conscious) sedation was employed during this procedure. A total of Versed 2.0 mg and Fentanyl 100 mcg was administered intravenously. Moderate Sedation Time: 56 minutes. The patient's level of consciousness and vital signs were monitored continuously by radiology nursing throughout the procedure under my direct supervision. CONTRAST:  80 cc FLUOROSCOPY TIME:  Fluoroscopy Time: 11 minutes 12 seconds (544 mGy). COMPLICATIONS: None PROCEDURE: Informed consent was obtained from the patient following explanation of the procedure, risks, benefits and alternatives. The patient understands, agrees and consents for the procedure. All questions were addressed. A time out was performed prior to the initiation of the procedure. Maximal barrier sterile technique utilized including caps, mask, sterile gowns, sterile gloves, large sterile drape, hand hygiene, and Betadine prep. Ultrasound survey of the right inguinal region was performed with images stored and sent to PACs, confirming patency of the vessel. 1% lidocaine was used for local anesthesia. Stab incision was made with 11 blade scalpel and blunt dissection was  performed with ultrasound guidance. A micropuncture needle was then used access the right common femoral artery under ultrasound. With excellent arterial blood flow returned, and an .018 micro wire was passed through the needle, observed enter the abdominal aorta under fluoroscopy. The needle was removed, and a micropuncture sheath was placed over the wire. The inner dilator and wire were removed, and an 035 Bentson wire was advanced under fluoroscopy into the abdominal aorta. The sheath was removed and a standard 5 [place] vascular sheath was placed. The dilator was removed and the sheath was flushed. Cobra catheter was use elect the celiac artery access. Angiogram was performed. Glidewire was used to navigate the base catheter into the common hepatic artery. Angiogram was performed.  A penumbra lantern 025 lumen microcatheter, straight tip, was then navigated with 014 fathom wire into the GDA origin and beyond the site of pseudoaneurysm into the gastroepiploic artery. Angiogram performed, confirming position. Coil embolization was then performed of the gastroepiploic artery beyond the pseudoaneurysm origin then withdrawing the microcatheter into the pseudoaneurysm. Catheter was carefully position within the central aspect of the filling pseudoaneurysm, and further coil embolization was performed with framing coils 20 mm x 60 mm both soft and standard. Microcatheter was then further withdrawn into the abnormal gastroduodenal artery with coil embolization performed with a combination of 3 mm and 4 mm standard/soft Ruby coils. Final angiogram was performed of the common hepatic artery. Angiogram was then performed of the superior mesenteric artery through the base catheter, with no residual contributing flow identified. Angio-Seal was then deployed at the right common femoral artery. Patient tolerated the procedure well and remained hemodynamically stable throughout. No complications were encountered and no significant  blood loss. 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 Status post Angio-Seal for hemostasis. Signed, Dulcy Fanny. Dellia Nims, RPVI Vascular and Interventional Radiology Specialists [facility] Radiology Electronically Signed   By: Corrie Mckusick D.O.   On: 11/11/2018 08:31   Ct Pancreas Abd W/wo  Result Date: 11/10/2018 CLINICAL DATA:  58 year old male with a history melena, prior hematemesis and pancreatitis. Prior MRI 11/09/2018 suggests complex mass of the pancreatic head as well as a pseudoaneurysm. EXAM: CT ABDOMEN WITHOUT AND WITH CONTRAST TECHNIQUE: Multidetector CT imaging of the abdomen was performed following the standard protocol before and following the bolus administration of intravenous contrast. CONTRAST:  <See Chart> [id number] IOPAMIDOL (ISOVUE-370) INJECTION 76% COMPARISON:  MRI 11/09/2018 FINDINGS: Lower chest: Atelectatic changes at the right lung base. Trace pleural effusions bilateral lower lungs. Hepatobiliary: Delayed CT images demonstrate uniform enhancement/attenuation of the liver parenchyma with the early phase imaging patchy attenuation likely related to flow phenomenon. There is trace intrahepatic ductal dilatation of both right and left-sided ducts. Gallbladder somewhat distended with no radiopaque stones within the gallbladder lumen. Trace pericholecystic fluid without evidence of gallbladder wall thickening. Ascites adjacent to the liver in the subdiaphragmatic space extends below the liver within the pericolic gutter. Ill-defined heterogeneous attenuation mass continuous with the pancreatic head extends towards the gastrohepatic ligament in the liver hilum. Questionable portal caval nodes, inseparable from the caudate (image 46 of series  9). Pancreas: CT demonstrates pancreatic ductal dilatation involving the tail the pancreas and body of the pancreas, with the dilatation cutting off at heterogeneous soft tissue mass involving the pancreatic head (image 7 of series 14). At this same level there is abrupt termination/cutoff of the common bile duct. (Image 7 of series 14). Heterogeneous soft tissue mass at this location represents a pseudoaneurysm measuring at least 6.8 cm in AP dimension on image 9 of series 14. There is near complete thrombosis of the pseudoaneurysm, with flow maintained anteriorly. The mass in the head of the pancreas compresses the portal vein, just beyond the confluence with superior mesenteric vein to a thread of contrast on image 59 of series 9. More superiorly there is heterogeneous hypoattenuating bilobed mass just superior to the common hepatic artery measuring 6.4 cm by 2.8 cm. This mass appear to have cystic configuration on the prior MRI with internal septations. Ill-defined inflammatory changes surrounding  the body and tail of the pancreas, with haziness of the adjacent fat. Spleen: Greatest dimension of spleen 13 cm on the axial images. Adrenals/Urinary Tract: Unremarkable appearance of the adrenal glands. No evidence of hydronephrosis of the right or left kidney. No nephrolithiasis. Unremarkable course of the bilateral ureters. Unremarkable appearance of the urinary bladder. Stomach/Bowel: Unremarkable appearance of stomach. Inflammatory changes of the upper abdomen are continuous with the proximal duodenum. No evidence of gastric outlet obstruction. Small bowel is decompressed with no focal wall thickening. Normal appendix. The cecum is unremarkable without significant distension. Inflammatory changes of the upper abdomen are continuous with the hepatic flexure. Colonic diverticular present of the transverse colon, splenic flexure, sigmoid colon. Vascular/Lymphatic: No significant atherosclerotic changes. Small lymph  nodes in the retroperitoneum. Low-density ascites in the dependent pelvis as well as adjacent to the liver. Portal vein is compressed just beyond the superior mesenteric vein confluence. No portal vein thrombus is identified. There is significant collateral formation of portal to portal collaterals via the gastroepiploic vasculature and at the hilum of the liver. Unremarkable appearance of the iliac veins. Other: Trace abdominal wall edema. e. Musculoskeletal: No acute displaced fracture. 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 This termination of the pancreatic duct occurs at a similar termination of the common bile duct, both are which are compressed/obstructed by heterogeneously attenuating/enhancing mass. The current CT resolution does not differentiate whether the mass effect is from the partially thrombosed pseudoaneurysm, or if there is a superimposed primary pancreatic mass at the pancreatic head. Preliminary results were discussed by telephone at the time of interpretation on 11/10/2018 at about 3 p.m. with Dr. Carol Ada , who verbally acknowledged these results. 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 Repeat cross-sectional imaging with CT or MRI may be useful once the pseudoaneurysm has been treated in order to increase specificity of findings in the pancreatic head, as neoplasm is not excluded. 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 Questionable pathologic adenopathy in the portacaval nodal stations and liver hilum,  as well as adjacent to the second portion of the duodenum, increasing the suspicion of occult malignancy. Trace ascites. Mild body wall edema/anasarca with bilateral pleural effusions. Electronically Signed   By: Corrie Mckusick D.O.   On: 11/10/2018 16:19   [place] [facility] [employer]  Result Date: 11/11/2018 INDICATION: 58 year old male with a history abdominal pain, hematemesis, melena, with cross-sectional imaging demonstrating pseudoaneurysm of the GDA, uncertain etiology. He presents for urgent angiogram and coil embolization. EXAM: ULTRASOUND GUIDED ACCESS RIGHT COMMON FEMORAL ARTERY MESENTERIC ANGIOGRAM COIL EMBOLIZATION OF GASTRODUODENAL ARTERY INCLUDING PSEUDOANEURYSM ARISING FROM THE GDA. CLOSURE OF RIGHT COMMON FEMORAL ARTERY WITH ANGIO-SEAL. MEDICATIONS: NONE ANESTHESIA/SEDATION: Moderate (conscious) sedation was employed during this procedure. A total of Versed 2.0 mg and Fentanyl 100 mcg was administered intravenously. Moderate Sedation Time: 56 minutes. The patient's level of consciousness and vital signs were monitored continuously by radiology nursing throughout the procedure under my direct supervision. CONTRAST:  80 cc FLUOROSCOPY TIME:  Fluoroscopy Time: 11 minutes 12 seconds (544 mGy). COMPLICATIONS: None PROCEDURE: Informed consent was obtained from the patient following  explanation of the procedure, risks, benefits and alternatives. The patient understands, agrees and consents for the procedure. All questions were addressed. A time out was performed prior to the initiation of the procedure. Maximal barrier sterile technique utilized including caps, mask, sterile gowns, sterile gloves, large sterile drape, hand hygiene, and Betadine prep. Ultrasound survey of the right inguinal region was performed with images stored and sent to PACs, confirming patency of the vessel. 1% lidocaine was used for local anesthesia. Stab incision was made with 11 blade scalpel and  blunt dissection was performed with ultrasound guidance. A micropuncture needle was then used access the right common femoral artery under ultrasound. With excellent arterial blood flow returned, and an .018 micro wire was passed through the needle, observed enter the abdominal aorta under fluoroscopy. The needle was removed, and a micropuncture sheath was placed over the wire. The inner dilator and wire were removed, and an 035 Bentson wire was advanced under fluoroscopy into the abdominal aorta. The sheath was removed and a standard 5 [place] vascular sheath was placed. The dilator was removed and the sheath was flushed. Cobra catheter was use elect the celiac artery access. Angiogram was performed. Glidewire was used to navigate the base catheter into the common hepatic artery. Angiogram was performed. A penumbra lantern 025 lumen microcatheter, straight tip, was then navigated with 014 fathom wire into the GDA origin and beyond the site of pseudoaneurysm into the gastroepiploic artery. Angiogram performed, confirming position. Coil embolization was then performed of the gastroepiploic artery beyond the pseudoaneurysm origin then withdrawing the microcatheter into the pseudoaneurysm. Catheter was carefully position within the central aspect of the filling pseudoaneurysm, and further coil embolization was performed with framing coils 20 mm x 60 mm both soft and standard. Microcatheter was then further withdrawn into the abnormal gastroduodenal artery with coil embolization performed with a combination of 3 mm and 4 mm standard/soft Ruby coils. Final angiogram was performed of the common hepatic artery. Angiogram was then performed of the superior mesenteric artery through the base catheter, with no residual contributing flow identified. Angio-Seal was then deployed at the right common femoral artery. Patient tolerated the procedure well and remained hemodynamically stable throughout. No complications were  encountered and no significant blood loss. 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 Status post Angio-Seal for hemostasis. Signed, Dulcy Fanny. Dellia Nims, RPVI [facility] Electronically Signed   By: Corrie Mckusick D.O.   On: 11/11/2018 08:31   Medications: I have reviewed the patient's current medications.  Assessment/Plan: 1) Rectal bleeding with anemia (hemoglobin of 8.5 g/dL) after coiling/embolization of  a large pseudoaneurysm in the gastroduodenal artery-lymphadenopathy is noted on the MR since exact nature of this mass is markedly unclear at this time. Off note, the MR findings are indicated of portal hypertension with dilated hepatic duct and ascites but an occult malignancy needs to be ruled out in view of the adenopathy noted on this exam. A repeat MRI has been suggested by Dr. Earleen Newport. May be helpful to check an CA-19-9 as well.   LOS: 6 days   Dai Apel 11/11/2018, 5:18 PM

## 2018-11-12 LAB — CBC
HCT: 27.9 % — ABNORMAL LOW (ref 39.0–52.0)
Hemoglobin: 9.1 g/dL — ABNORMAL LOW (ref 13.0–17.0)
MCH: 26.3 pg (ref 26.0–34.0)
MCHC: 32.6 g/dL (ref 30.0–36.0)
MCV: 80.6 fL (ref 80.0–100.0)
Platelets: 249 10*3/uL (ref 150–400)
RBC: 3.46 MIL/uL — ABNORMAL LOW (ref 4.22–5.81)
RDW: 17.2 % — ABNORMAL HIGH (ref 11.5–15.5)
WBC: 4 10*3/uL (ref 4.0–10.5)
nRBC: 0 % (ref 0.0–0.2)

## 2018-11-12 LAB — BASIC METABOLIC PANEL
Anion gap: 7 (ref 5–15)
BUN: 7 mg/dL (ref 6–20)
CO2: 27 mmol/L (ref 22–32)
CREATININE: 0.83 mg/dL (ref 0.61–1.24)
Calcium: 8.1 mg/dL — ABNORMAL LOW (ref 8.9–10.3)
Chloride: 103 mmol/L (ref 98–111)
GFR calc Af Amer: >60 mL/min (ref >60)
GFR calc non Af Amer: >60 mL/min (ref >60)
Glucose, Bld: 96 mg/dL (ref 70–99)
Potassium: 3.9 mmol/L (ref 3.5–5.1)
Sodium: 137 mmol/L (ref 135–145)

## 2018-11-12 NOTE — Progress Notes (Signed)
Provided patient and significant other with SVT patient education handout and reviewed basic info on handout with patient. Patient had episode of SVT on 1/8.   Patient ambulated in hall with partner during afternoon.   Medium formed red stool with brown streaks observed at 1600. Documented in flow sheets.

## 2018-11-12 NOTE — Progress Notes (Signed)
Subjective: Still with some hematochezia, but less.  Soreness in the abdomen.  Objective: Vital signs in last 24 hours: Temp:  [98.4 F (36.9 C)-99 F (37.2 C)] 98.4 F (36.9 C) (01/09 0542) Pulse Rate:  [60-78] 62 (01/09 0842) Resp:  [16] 16 (01/08 1349) BP: (122-138)/(82-95) 138/95 (01/09 0842) SpO2:  [100 %] 100 % (01/09 0542) Last BM Date: 11/11/18  Intake/Output from previous day: No intake/output data recorded. Intake/Output this shift: No intake/output data recorded.  General appearance: alert and no distress GI: epigastric tenderness  Lab Results: Recent Labs    11/10/18 0517 11/11/18 0412 11/12/18 0419  WBC 3.6* 3.7* 4.0  HGB 7.4* 8.5* 9.1*  HCT 22.9* 26.8* 27.9*  PLT 207 213 249   BMET Recent Labs    11/10/18 0517 11/11/18 0412 11/12/18 0419  NA 138 137 137  K 3.8 3.5 3.9  CL 106 104 103  CO2 24 28 27   GLUCOSE 93 88 96  BUN 5* 5* 7  CREATININE 0.86 0.81 0.83  CALCIUM 8.0* 8.1* 8.1*   LFT Recent Labs    11/11/18 0412  PROT 4.9*  ALBUMIN 2.2*  AST 15  ALT 13  ALKPHOS 66  BILITOT 1.6*   PT/INR No results for input(s): LABPROT, INR in the last 72 hours. Hepatitis Panel No results for input(s): HEPBSAG, HCVAB, HEPAIGM, HEPBIGM in the last 72 hours. C-Diff No results for input(s): CDIFFTOX in the last 72 hours. Fecal Lactopherrin No results for input(s): FECLLACTOFRN in the last 72 hours.  Studies/Results: Ir Angiogram Visceral Selective  Result Date: 11/11/2018 INDICATION: 58 year old male with a history abdominal pain, hematemesis, melena, with cross-sectional imaging demonstrating pseudoaneurysm of the GDA, uncertain etiology. He presents for urgent angiogram and coil embolization. EXAM: ULTRASOUND GUIDED ACCESS RIGHT COMMON FEMORAL ARTERY MESENTERIC ANGIOGRAM COIL EMBOLIZATION OF GASTRODUODENAL ARTERY INCLUDING PSEUDOANEURYSM ARISING FROM THE GDA. CLOSURE OF RIGHT COMMON FEMORAL ARTERY WITH ANGIO-SEAL. MEDICATIONS: NONE  ANESTHESIA/SEDATION: Moderate (conscious) sedation was employed during this procedure. A total of Versed 2.0 mg and Fentanyl 100 mcg was administered intravenously. Moderate Sedation Time: 56 minutes. The patient's level of consciousness and vital signs were monitored continuously by radiology nursing throughout the procedure under my direct supervision. CONTRAST:  80 cc FLUOROSCOPY TIME:  Fluoroscopy Time: 11 minutes 12 seconds (544 mGy). COMPLICATIONS: None PROCEDURE: Informed consent was obtained from the patient following explanation of the procedure, risks, benefits and alternatives. The patient understands, agrees and consents for the procedure. All questions were addressed. A time out was performed prior to the initiation of the procedure. Maximal barrier sterile technique utilized including caps, mask, sterile gowns, sterile gloves, large sterile drape, hand hygiene, and Betadine prep. Ultrasound survey of the right inguinal region was performed with images stored and sent to PACs, confirming patency of the vessel. 1% lidocaine was used for local anesthesia. Stab incision was made with 11 blade scalpel and blunt dissection was performed with ultrasound guidance. A micropuncture needle was then used access the right common femoral artery under ultrasound. With excellent arterial blood flow returned, and an .018 micro wire was passed through the needle, observed enter the abdominal aorta under fluoroscopy. The needle was removed, and a micropuncture sheath was placed over the wire. The inner dilator and wire were removed, and an 035 Bentson wire was advanced under fluoroscopy into the abdominal aorta. The sheath was removed and a standard 5 [place] vascular sheath was placed. The dilator was removed and the sheath was flushed. Cobra catheter was use elect the celiac artery  access. Angiogram was performed. Glidewire was used to navigate the base catheter into the common hepatic artery. Angiogram was performed. A  penumbra lantern 025 lumen microcatheter, straight tip, was then navigated with 014 fathom wire into the GDA origin and beyond the site of pseudoaneurysm into the gastroepiploic artery. Angiogram performed, confirming position. Coil embolization was then performed of the gastroepiploic artery beyond the pseudoaneurysm origin then withdrawing the microcatheter into the pseudoaneurysm. Catheter was carefully position within the central aspect of the filling pseudoaneurysm, and further coil embolization was performed with framing coils 20 mm x 60 mm both soft and standard. Microcatheter was then further withdrawn into the abnormal gastroduodenal artery with coil embolization performed with a combination of 3 mm and 4 mm standard/soft Ruby coils. Final angiogram was performed of the common hepatic artery. Angiogram was then performed of the superior mesenteric artery through the base catheter, with no residual contributing flow identified. Angio-Seal was then deployed at the right common femoral artery. Patient tolerated the procedure well and remained hemodynamically stable throughout. No complications were encountered and no significant blood loss. 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 Status post Angio-Seal for hemostasis. Signed, Dulcy Fanny. Dellia Nims, RPVI [facility] Inter[facility] Radiology Special[facility] Radiology Electronically Signed   By: Corrie Mckusick D.O.   On: 11/11/2018 08:31   Ir Angiogram Visceral Selective  Result Date: 11/11/2018 INDICATION: 58 year old male with a history abdominal pain, hematemesis, melena, with cross-sectional imaging demonstrating pseudoaneurysm of the GDA, uncertain etiology. He presents for urgent angiogram and coil  embolization. EXAM: ULTRASOUND GUIDED ACCESS RIGHT COMMON FEMORAL ARTERY MESENTERIC ANGIOGRAM COIL EMBOLIZATION OF GASTRODUODENAL ARTERY INCLUDING PSEUDOANEURYSM ARISING FROM THE GDA. CLOSURE OF RIGHT COMMON FEMORAL ARTERY WITH ANGIO-SEAL. MEDICATIONS: NONE ANESTHESIA/SEDATION: Moderate (conscious) sedation was employed during this procedure. A total of Versed 2.0 mg and Fentanyl 100 mcg was administered intravenously. Moderate Sedation Time: 56 minutes. The patient's level of consciousness and vital signs were monitored continuously by radiology nursing throughout the procedure under my direct supervision. CONTRAST:  80 cc FLUOROSCOPY TIME:  Fluoroscopy Time: 11 minutes 12 seconds (544 mGy). COMPLICATIONS: None PROCEDURE: Informed consent was obtained from the patient following explanation of the procedure, risks, benefits and alternatives. The patient understands, agrees and consents for the procedure. All questions were addressed. A time out was performed prior to the initiation of the procedure. Maximal barrier sterile technique utilized including caps, mask, sterile gowns, sterile gloves, large sterile drape, hand hygiene, and Betadine prep. Ultrasound survey of the right inguinal region was performed with images stored and sent to PACs, confirming patency of the vessel. 1% lidocaine was used for local anesthesia. Stab incision was made with 11 blade scalpel and blunt dissection was performed with ultrasound guidance. A micropuncture needle was then used access the right common femoral artery under ultrasound. With excellent arterial blood flow returned, and an .018 micro wire was passed through the needle, observed enter the abdominal aorta under fluoroscopy. The needle was removed, and a micropuncture sheath was placed over the wire. The inner dilator and wire were removed, and an 035 Bentson wire was advanced under fluoroscopy into the abdominal aorta. The sheath was removed and a standard 5 [place] vascular  sheath was placed. The dilator was removed and the sheath was flushed. Cobra catheter was use elect  the celiac artery access. Angiogram was performed. Glidewire was used to navigate the base catheter into the common hepatic artery. Angiogram was performed. A penumbra lantern 025 lumen microcatheter, straight tip, was then navigated with 014 fathom wire into the GDA origin and beyond the site of pseudoaneurysm into the gastroepiploic artery. Angiogram performed, confirming position. Coil embolization was then performed of the gastroepiploic artery beyond the pseudoaneurysm origin then withdrawing the microcatheter into the pseudoaneurysm. Catheter was carefully position within the central aspect of the filling pseudoaneurysm, and further coil embolization was performed with framing coils 20 mm x 60 mm both soft and standard. Microcatheter was then further withdrawn into the abnormal gastroduodenal artery with coil embolization performed with a combination of 3 mm and 4 mm standard/soft Ruby coils. Final angiogram was performed of the common hepatic artery. Angiogram was then performed of the superior mesenteric artery through the base catheter, with no residual contributing flow identified. Angio-Seal was then deployed at the right common femoral artery. Patient tolerated the procedure well and remained hemodynamically stable throughout. No complications were encountered and no significant blood loss. 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 Status post Angio-Seal for hemostasis. Signed, Dulcy Fanny. Dellia Nims, RPVI V[facility] and Inter[facility] Radiology Specialists [facility] Radiology Electronically Signed   By: Corrie Mckusick D.O.   On: 11/11/2018 08:31   Ir Angiogram  Selective Each Additional Vessel  Result Date: 11/11/2018 INDICATION: 58 year old male with a history abdominal pain, hematemesis, melena, with cross-sectional imaging demonstrating pseudoaneurysm of the GDA, uncertain etiology. He presents for urgent angiogram and coil embolization. EXAM: ULTRASOUND GUIDED ACCESS RIGHT COMMON FEMORAL ARTERY MESENTERIC ANGIOGRAM COIL EMBOLIZATION OF GASTRODUODENAL ARTERY INCLUDING PSEUDOANEURYSM ARISING FROM THE GDA. CLOSURE OF RIGHT COMMON FEMORAL ARTERY WITH ANGIO-SEAL. MEDICATIONS: NONE ANESTHESIA/SEDATION: Moderate (conscious) sedation was employed during this procedure. A total of Versed 2.0 mg and Fentanyl 100 mcg was administered intravenously. Moderate Sedation Time: 56 minutes. The patient's level of consciousness and vital signs were monitored continuously by radiology nursing throughout the procedure under my direct supervision. CONTRAST:  80 cc FLUOROSCOPY TIME:  Fluoroscopy Time: 11 minutes 12 seconds (544 mGy). COMPLICATIONS: None PROCEDURE: Informed consent was obtained from the patient following explanation of the procedure, risks, benefits and alternatives. The patient understands, agrees and consents for the procedure. All questions were addressed. A time out was performed prior to the initiation of the procedure. Maximal barrier sterile technique utilized including caps, mask, sterile gowns, sterile gloves, large sterile drape, hand hygiene, and Betadine prep. Ultrasound survey of the right inguinal region was performed with images stored and sent to PACs, confirming patency of the vessel. 1% lidocaine was used for local anesthesia. Stab incision was made with 11 blade scalpel and blunt dissection was performed with ultrasound guidance. A micropuncture needle was then used access the right common femoral artery under ultrasound. With excellent arterial blood flow returned, and an .018 micro wire was passed through the needle, observed enter the abdominal aorta  under fluoroscopy. The needle was removed, and a micropuncture sheath was placed over the wire. The inner dilator and wire were removed, and an 035 Bentson wire was advanced under fluoroscopy into the abdominal aorta. The sheath was removed and a standard 5 [place] vascular sheath was placed. The dilator was removed and the sheath was flushed. [employer]  catheter was use elect the celiac artery access. Angiogram was performed. Glidewire was used to navigate the base catheter into the common hepatic artery. Angiogram was performed. A penumbra lantern 025 lumen microcatheter, straight tip, was then navigated with 014 fathom wire into the GDA origin and beyond the site of pseudoaneurysm into the gastroepiploic artery. Angiogram performed, confirming position. Coil embolization was then performed of the gastroepiploic artery beyond the pseudoaneurysm origin then withdrawing the microcatheter into the pseudoaneurysm. Catheter was carefully position within the central aspect of the filling pseudoaneurysm, and further coil embolization was performed with framing coils 20 mm x 60 mm both soft and standard. Microcatheter was then further withdrawn into the abnormal gastroduodenal artery with coil embolization performed with a combination of 3 mm and 4 mm standard/soft Ruby coils. Final angiogram was performed of the common hepatic artery. Angiogram was then performed of the superior mesenteric artery through the base catheter, with no residual contributing flow identified. Angio-Seal was then deployed at the right common femoral artery. Patient tolerated the procedure well and remained hemodynamically stable throughout. No complications were encountered and no significant blood loss. 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 Status post Angio-Seal for hemostasis. Signed, Dulcy Fanny. Dellia Nims, RPVI Vascular and Interventional Radiology Specialists [facility] Radiology Electronically Signed   By: Corrie Mckusick D.O.   On: 11/11/2018 08:31   Ir US Guide Vasc Access Right  Result Date: 11/11/2018 INDICATION: 58 year old male with a history abdominal pain, hematemesis, melena, with cross-sectional imaging demonstrating pseudoaneurysm of the GDA, uncertain etiology. He presents for urgent angiogram and coil embolization. EXAM: ULTRASOUND GUIDED ACCESS RIGHT COMMON FEMORAL ARTERY MESENTERIC ANGIOGRAM COIL EMBOLIZATION OF GASTRODUODENAL ARTERY INCLUDING PSEUDOANEURYSM ARISING FROM THE GDA. CLOSURE OF RIGHT COMMON FEMORAL ARTERY WITH ANGIO-SEAL. MEDICATIONS: NONE ANESTHESIA/SEDATION: Moderate (conscious) sedation was employed during this procedure. A total of Versed 2.0 mg and Fentanyl 100 mcg was administered intravenously. Moderate Sedation Time: 56 minutes. The patient's level of consciousness and vital signs were monitored continuously by radiology nursing throughout the procedure under my direct supervision. CONTRAST:  80 cc FLUOROSCOPY TIME:  Fluoroscopy Time: 11 minutes 12 seconds (544 mGy). COMPLICATIONS: None PROCEDURE: Informed consent was obtained from the patient following explanation of the procedure, risks, benefits and alternatives. The patient understands, agrees and consents for the procedure. All questions were addressed. A time out was performed prior to the initiation of the procedure. Maximal barrier sterile technique utilized including caps, mask, sterile gowns, sterile gloves, large sterile drape, hand hygiene, and Betadine prep. Ultrasound survey of the right inguinal region was performed with images stored and sent to PACs, confirming patency of the vessel. 1% lidocaine was used for local anesthesia. Stab incision was made with 11 blade scalpel and blunt dissection was  performed with ultrasound guidance. A micropuncture needle was then used access the right common femoral artery under ultrasound. With excellent arterial blood flow returned, and an .018 micro wire was passed through the needle, observed enter the abdominal aorta under fluoroscopy. The needle was removed, and a micropuncture sheath was placed over the wire. The inner dilator and wire were removed, and an 035 Bentson wire was advanced under fluoroscopy into the abdominal aorta. The sheath was removed and a standard 5 [place] vascular sheath was placed. The dilator was removed and the  sheath was flushed. Cobra catheter was use elect the celiac artery access. Angiogram was performed. Glidewire was used to navigate the base catheter into the common hepatic artery. Angiogram was performed. A penumbra lantern 025 lumen microcatheter, straight tip, was then navigated with 014 fathom wire into the GDA origin and beyond the site of pseudoaneurysm into the gastroepiploic artery. Angiogram performed, confirming position. Coil embolization was then performed of the gastroepiploic artery beyond the pseudoaneurysm origin then withdrawing the microcatheter into the pseudoaneurysm. Catheter was carefully position within the central aspect of the filling pseudoaneurysm, and further coil embolization was performed with framing coils 20 mm x 60 mm both soft and standard. Microcatheter was then further withdrawn into the abnormal gastroduodenal artery with coil embolization performed with a combination of 3 mm and 4 mm standard/soft Ruby coils. Final angiogram was performed of the common hepatic artery. Angiogram was then performed of the superior mesenteric artery through the base catheter, with no residual contributing flow identified. Angio-Seal was then deployed at the right common femoral artery. Patient tolerated the procedure well and remained hemodynamically stable throughout. No complications were encountered and no significant  blood loss. 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 Status post Angio-Seal for hemostasis. Signed, Dulcy Fanny. Dellia Nims, RPVI Vascular and Interventional Radiology Specialists [facility] Radiology Electronically Signed   By: Corrie Mckusick D.O.   On: 11/11/2018 08:31   Ct Pancreas Abd W/wo  Result Date: 11/10/2018 CLINICAL DATA:  58 year old male with a history melena, prior hematemesis and pancreatitis. Prior MRI 11/09/2018 suggests complex mass of the pancreatic head as well as a pseudoaneurysm. EXAM: CT ABDOMEN WITHOUT AND WITH CONTRAST TECHNIQUE: Multidetector CT imaging of the abdomen was performed following the standard protocol before and following the bolus administration of intravenous contrast. CONTRAST:  <See Chart> [id number] IOPAMIDOL (ISOVUE-370) INJECTION 76% COMPARISON:  MRI 11/09/2018 FINDINGS: Lower chest: Atelectatic changes at the right lung base. Trace pleural effusions bilateral lower lungs. Hepatobiliary: Delayed CT images demonstrate uniform enhancement/attenuation of the liver parenchyma with the early phase imaging patchy attenuation likely related to flow phenomenon. There is trace intrahepatic ductal dilatation of both right and left-sided ducts. Gallbladder somewhat distended with no radiopaque stones within the gallbladder lumen. Trace pericholecystic fluid without evidence of gallbladder wall thickening. Ascites adjacent to the liver in the subdiaphragmatic space extends below the liver within the pericolic gutter. Ill-defined heterogeneous attenuation mass continuous with the pancreatic head extends towards the gastrohepatic ligament in the liver hilum. Questionable portal caval nodes, inseparable from the caudate (image 46 of series  9). Pancreas: CT demonstrates pancreatic ductal dilatation involving the tail the pancreas and body of the pancreas, with the dilatation cutting off at heterogeneous soft tissue mass involving the pancreatic head (image 7 of series 14). At this same level there is abrupt termination/cutoff of the common bile duct. (Image 7 of series 14). Heterogeneous soft tissue mass at this location represents a pseudoaneurysm measuring at least 6.8 cm in AP dimension on image 9 of series 14. There is near complete thrombosis of the pseudoaneurysm, with flow maintained anteriorly. The mass in the head of the pancreas compresses the portal vein, just beyond the confluence with superior mesenteric vein to a thread of contrast on image 59 of series 9. More superiorly there is heterogeneous hypoattenuating bilobed mass  just superior to the common hepatic artery measuring 6.4 cm by 2.8 cm. This mass appear to have cystic configuration on the prior MRI with internal septations. Ill-defined inflammatory changes surrounding the body and tail of the pancreas, with haziness of the adjacent fat. Spleen: Greatest dimension of spleen 13 cm on the axial images. Adrenals/Urinary Tract: Unremarkable appearance of the adrenal glands. No evidence of hydronephrosis of the right or left kidney. No nephrolithiasis. Unremarkable course of the bilateral ureters. Unremarkable appearance of the urinary bladder. Stomach/Bowel: Unremarkable appearance of stomach. Inflammatory changes of the upper abdomen are continuous with the proximal duodenum. No evidence of gastric outlet obstruction. Small bowel is decompressed with no focal wall thickening. Normal appendix. The cecum is unremarkable without significant distension. Inflammatory changes of the upper abdomen are continuous with the hepatic flexure. Colonic diverticular present of the transverse colon, splenic flexure, sigmoid colon. Vascular/Lymphatic: No significant atherosclerotic changes. Small lymph  nodes in the retroperitoneum. Low-density ascites in the dependent pelvis as well as adjacent to the liver. Portal vein is compressed just beyond the superior mesenteric vein confluence. No portal vein thrombus is identified. There is significant collateral formation of portal to portal collaterals via the gastroepiploic vasculature and at the hilum of the liver. Unremarkable appearance of the iliac veins. Other: Trace abdominal wall edema. e. Musculoskeletal: No acute displaced fracture. 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 This termination of the pancreatic duct occurs at a similar termination of the common bile duct, both are which are compressed/obstructed by heterogeneously attenuating/enhancing mass. The current CT resolution does not differentiate whether the mass effect is from the partially thrombosed pseudoaneurysm, or if there is a superimposed primary pancreatic mass at the pancreatic head. Preliminary results were discussed by telephone at the time of interpretation on 11/10/2018 at about 3 p.m. with Dr. Carol Ada , who verbally acknowledged these results. 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 Repeat cross-sectional imaging with CT or MRI may be useful once the pseudoaneurysm has been treated in order to increase specificity of findings in the pancreatic head, as neoplasm is not excluded. 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 Questionable pathologic adenopathy in the portacaval nodal stations and liver hilum,  as well as adjacent to the second portion of the duodenum, increasing the suspicion of occult malignancy. Trace ascites. Mild body wall edema/anasarca with bilateral pleural effusions. Electronically Signed   By: Corrie Mckusick D.O.   On: 11/10/2018 16:19   [place] Guide Roadmapping  Result Date: 11/11/2018 INDICATION: 58 year old male with a history abdominal pain, hematemesis, melena, with cross-sectional imaging demonstrating pseudoaneurysm of the GDA, uncertain etiology. He presents for urgent angiogram and coil embolization. EXAM: ULTRASOUND GUIDED ACCESS RIGHT COMMON FEMORAL ARTERY MESENTERIC ANGIOGRAM COIL EMBOLIZATION OF GASTRODUODENAL ARTERY INCLUDING PSEUDOANEURYSM ARISING FROM THE GDA. CLOSURE OF RIGHT COMMON FEMORAL ARTERY WITH ANGIO-SEAL. MEDICATIONS: NONE ANESTHESIA/SEDATION: Moderate (conscious) sedation was employed during this procedure. A total of Versed 2.0 mg and Fentanyl 100 mcg was administered intravenously. Moderate Sedation Time: 56 minutes. The patient's level of consciousness and vital signs were monitored continuously by radiology nursing throughout the  procedure under my direct supervision. CONTRAST:  80 cc FLUOROSCOPY TIME:  Fluoroscopy Time: 11 minutes 12 seconds (544 mGy). COMPLICATIONS: None PROCEDURE: Informed consent was obtained from the patient following explanation of the procedure, risks, benefits and alternatives. The patient understands, agrees and consents for the procedure. All questions were addressed. A time out was performed prior to the initiation of the procedure. Maximal barrier sterile technique utilized including caps, mask, sterile gowns, sterile gloves, large sterile drape, hand hygiene, and Betadine prep. Ultrasound survey of the right inguinal region was performed with images stored and sent to PACs, confirming patency of the vessel. 1% lidocaine was used for local anesthesia. Stab incision was made with 11 blade scalpel and  blunt dissection was performed with ultrasound guidance. A micropuncture needle was then used access the right common femoral artery under ultrasound. With excellent arterial blood flow returned, and an .018 micro wire was passed through the needle, observed enter the abdominal aorta under fluoroscopy. The needle was removed, and a micropuncture sheath was placed over the wire. The inner dilator and wire were removed, and an 035 Bentson wire was advanced under fluoroscopy into the abdominal aorta. The sheath was removed and a standard 5 [place] vascular sheath was placed. The dilator was removed and the sheath was flushed. Cobra catheter was use elect the celiac artery access. Angiogram was performed. Glidewire was used to navigate the base catheter into the common hepatic artery. Angiogram was performed. A penumbra lantern 025 lumen microcatheter, straight tip, was then navigated with 014 fathom wire into the GDA origin and beyond the site of pseudoaneurysm into the gastroepiploic artery. Angiogram performed, confirming position. Coil embolization was then performed of the gastroepiploic artery beyond the pseudoaneurysm origin then withdrawing the microcatheter into the pseudoaneurysm. Catheter was carefully position within the central aspect of the filling pseudoaneurysm, and further coil embolization was performed with framing coils 20 mm x 60 mm both soft and standard. Microcatheter was then further withdrawn into the abnormal gastroduodenal artery with coil embolization performed with a combination of 3 mm and 4 mm standard/soft Ruby coils. Final angiogram was performed of the common hepatic artery. Angiogram was then performed of the superior mesenteric artery through the base catheter, with no residual contributing flow identified. Angio-Seal was then deployed at the right common femoral artery. Patient tolerated the procedure well and remained hemodynamically stable throughout. No complications were  encountered and no significant blood loss. 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 Status post Angio-Seal for hemostasis. Signed, Dulcy Fanny. Dellia Nims, RPVI [facility] Electronically Signed   By: Corrie Mckusick D.O.   On: 11/11/2018 08:31    Medications:  Scheduled: . feeding supplement (ENSURE ENLIVE)  237 mL Oral TID BM  . folic acid  1 mg Oral Daily  . multivitamin with minerals  1 tablet Oral Daily  . pantoprazole (PROTONIX) IV  40 mg Intravenous Q12H  . thiamine  100 mg Oral Daily   Continuous: . sodium chloride 20 mL/hr at 11/10/18 1409    Assessment/Plan: 1) GDA pseudoaneurysm s/p coiling. 2) ? 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 3) Hematochezia.   The bleeding is lessening and his HGB is stable.  It may be that he was leaking from the pseudoaneurysm.  The etiology of the  pancreatic findings is unknown.  From the GI perspective, if he stops bleeding repeat imaging in the next 2-3 months will be required to assess for stability, improvement, or worsening of the pancreatic cyst/mass.  In spite of the significant biliary ductal dilation, his liver enzymes have remained normal.  There is a mild elevation of the TB.  Plan: 1) Follow HGB and transfuse as necessary. 2) If bleeding does not continue to improve, a bleeding scan will be appropriate.   LOS: 7 days   Keniah Klemmer D 11/12/2018, 12:50 PM

## 2018-11-12 NOTE — Progress Notes (Signed)
Chief Complaint: Patient was seen today for follow up [place] embolization   Supervising Physician: Markus Daft  Patient Status: [facility] - In-pt  Subjective: S/p visceral angio with extensive coil embo of GDA including large pseudoaneurysm. Pt feeling okay this am. A little sore. Trying to drink some liquids. No further hematemesis.   Objective: Physical Exam: BP (!) 138/95 (BP Location: Left Arm)   Pulse 62   Temp 98.4 F (36.9 C) (Oral)   Resp 16   Ht 5\' 11"  (1.803 m)   Wt 72 kg   SpO2 100%   BMI 22.13 kg/m  A&O x 3 Abd: soft, ND, NT Ext: (R)groin soft, no hematoma, mild tenderness. Leg warm, excellent pedal pulse.   Current Facility-Administered Medications:  .  0.9 %  sodium chloride infusion, , Intravenous, Continuous, Emokpae, Courage, MD, Last Rate: 20 mL/hr at 11/10/18 1409 .  acetaminophen (TYLENOL) tablet 650 mg, 650 mg, Oral, Q6H PRN **OR** acetaminophen (TYLENOL) suppository 650 mg, 650 mg, Rectal, Q6H PRN, Cirigliano, Vito V, DO .  cyclobenzaprine (FLEXERIL) tablet 5 mg, 5 mg, Oral, TID PRN, Cirigliano, Vito V, DO .  feeding supplement (ENSURE ENLIVE) (ENSURE ENLIVE) liquid 237 mL, 237 mL, Oral, TID BM, Emokpae, Courage, MD, 237 mL at 11/11/18 2030 .  folic acid (FOLVITE) tablet 1 mg, 1 mg, Oral, Daily, Cirigliano, Vito V, DO, 1 mg at 11/12/18 0845 .  hydrALAZINE (APRESOLINE) injection 2 mg, 2 mg, Intravenous, Q4H PRN, Cirigliano, Vito V, DO .  HYDROcodone-acetaminophen (NORCO/VICODIN) 5-325 MG per tablet 1-2 tablet, 1-2 tablet, Oral, Q4H PRN, Cirigliano, Vito V, DO, 2 tablet at 11/12/18 0845 .  morphine 4 MG/ML injection 4 mg, 4 mg, Intravenous, Q4H PRN, Cirigliano, Vito V, DO, 4 mg at 11/09/18 0519 .  multivitamin with minerals tablet 1 tablet, 1 tablet, Oral, Daily, Cirigliano, Vito V, DO, 1 tablet at 11/12/18 0844 .  ondansetron (ZOFRAN) tablet 4 mg, 4 mg, Oral, Q6H PRN **OR** ondansetron (ZOFRAN) injection 4 mg, 4 mg, Intravenous, Q6H PRN, Cirigliano, Vito V,  DO .  pantoprazole (PROTONIX) injection 40 mg, 40 mg, Intravenous, Q12H, Cirigliano, Vito V, DO, 40 mg at 11/12/18 0853 .  thiamine (VITAMIN B-1) tablet 100 mg, 100 mg, Oral, Daily, Cirigliano, Vito V, DO, 100 mg at 11/12/18 0844  Labs: CBC Recent Labs    11/11/18 0412 11/12/18 0419  WBC 3.7* 4.0  HGB 8.5* 9.1*  HCT 26.8* 27.9*  PLT 213 249   BMET Recent Labs    11/11/18 0412 11/12/18 0419  NA 137 137  K 3.5 3.9  CL 104 103  CO2 28 27  GLUCOSE 88 96  BUN 5* 7  CREATININE 0.81 0.83  CALCIUM 8.1* 8.1*   LFT Recent Labs    11/11/18 0412  PROT 4.9*  ALBUMIN 2.2*  AST 15  ALT 13  ALKPHOS 66  BILITOT 1.6*   PT/INR No results for input(s): LABPROT, INR in the last 72 hours.   Studies/Results: Ir Angiogram Visceral Selective  Result Date: 11/11/2018 INDICATION: 58 year old male with a history abdominal pain, hematemesis, melena, with cross-sectional imaging demonstrating pseudoaneurysm of the GDA, uncertain etiology. He presents for urgent angiogram and coil embolization. EXAM: ULTRASOUND GUIDED ACCESS RIGHT COMMON FEMORAL ARTERY MESENTERIC ANGIOGRAM COIL EMBOLIZATION OF GASTRODUODENAL ARTERY INCLUDING PSEUDOANEURYSM ARISING FROM THE GDA. CLOSURE OF RIGHT COMMON FEMORAL ARTERY WITH ANGIO-SEAL. MEDICATIONS: NONE ANESTHESIA/SEDATION: Moderate (conscious) sedation was employed during this procedure. A total of Versed 2.0 mg and Fentanyl 100 mcg was administered intravenously. Moderate Sedation Time:  56 minutes. The patient's level of consciousness and vital signs were monitored continuously by radiology nursing throughout the procedure under my direct supervision. CONTRAST:  80 cc FLUOROSCOPY TIME:  Fluoroscopy Time: 11 minutes 12 seconds (544 mGy). COMPLICATIONS: None PROCEDURE: Informed consent was obtained from the patient following explanation of the procedure, risks, benefits and alternatives. The patient understands, agrees and consents for the procedure. All questions were  addressed. A time out was performed prior to the initiation of the procedure. Maximal barrier sterile technique utilized including caps, mask, sterile gowns, sterile gloves, large sterile drape, hand hygiene, and Betadine prep. Ultrasound survey of the right inguinal region was performed with images stored and sent to PACs, confirming patency of the vessel. 1% lidocaine was used for local anesthesia. Stab incision was made with 11 blade scalpel and blunt dissection was performed with ultrasound guidance. A micropuncture needle was then used access the right common femoral artery under ultrasound. With excellent arterial blood flow returned, and an .018 micro wire was passed through the needle, observed enter the abdominal aorta under fluoroscopy. The needle was removed, and a micropuncture sheath was placed over the wire. The inner dilator and wire were removed, and an 035 Bentson wire was advanced under fluoroscopy into the abdominal aorta. The sheath was removed and a standard 5 [place] vascular sheath was placed. The dilator was removed and the sheath was flushed. Cobra catheter was use elect the celiac artery access. Angiogram was performed. Glidewire was used to navigate the base catheter into the common hepatic artery. Angiogram was performed. A penumbra lantern 025 lumen microcatheter, straight tip, was then navigated with 014 fathom wire into the GDA origin and beyond the site of pseudoaneurysm into the gastroepiploic artery. Angiogram performed, confirming position. Coil embolization was then performed of the gastroepiploic artery beyond the pseudoaneurysm origin then withdrawing the microcatheter into the pseudoaneurysm. Catheter was carefully position within the central aspect of the filling pseudoaneurysm, and further coil embolization was performed with framing coils 20 mm x 60 mm both soft and standard. Microcatheter was then further withdrawn into the abnormal gastroduodenal artery with coil  embolization performed with a combination of 3 mm and 4 mm standard/soft Ruby coils. Final angiogram was performed of the common hepatic artery. Angiogram was then performed of the superior mesenteric artery through the base catheter, with no residual contributing flow identified. Angio-Seal was then deployed at the right common femoral artery. Patient tolerated the procedure well and remained hemodynamically stable throughout. No complications were encountered and no significant blood loss. 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 Status post Angio-Seal for hemostasis. Signed, Dulcy Fanny. Dellia Nims, RPVI [facility] Radiology Electronically Signed   By: Corrie Mckusick D.O.   On: 11/11/2018 08:31   Ir Angiogram Visceral Selective  Result Date: 11/11/2018 INDICATION: 58 year old male with a history abdominal pain, hematemesis, melena, with cross-sectional imaging demonstrating pseudoaneurysm of the GDA, uncertain etiology. He presents for urgent angiogram and coil embolization. EXAM: ULTRASOUND GUIDED ACCESS RIGHT COMMON FEMORAL ARTERY MESENTERIC ANGIOGRAM COIL EMBOLIZATION OF GASTRODUODENAL ARTERY INCLUDING PSEUDOANEURYSM ARISING FROM THE GDA. CLOSURE OF RIGHT COMMON FEMORAL ARTERY WITH ANGIO-SEAL. MEDICATIONS: NONE ANESTHESIA/SEDATION: Moderate (conscious) sedation was employed during this procedure. A total of Versed 2.0 mg and Fentanyl 100 mcg was administered intravenously. Moderate  Sedation Time: 56 minutes. The patient's level of consciousness and vital signs were monitored continuously by radiology nursing throughout the procedure under my direct supervision. CONTRAST:  80 cc FLUOROSCOPY TIME:  Fluoroscopy Time:  11 minutes 12 seconds (544 mGy). COMPLICATIONS: None PROCEDURE: Informed consent was obtained from the patient following explanation of the procedure, risks, benefits and alternatives. The patient understands, agrees and consents for the procedure. All questions were addressed. A time out was performed prior to the initiation of the procedure. Maximal barrier sterile technique utilized including caps, mask, sterile gowns, sterile gloves, large sterile drape, hand hygiene, and Betadine prep. Ultrasound survey of the right inguinal region was performed with images stored and sent to PACs, confirming patency of the vessel. 1% lidocaine was used for local anesthesia. Stab incision was made with 11 blade scalpel and blunt dissection was performed with ultrasound guidance. A micropuncture needle was then used access the right common femoral artery under ultrasound. With excellent arterial blood flow returned, and an .018 micro wire was passed through the needle, observed enter the abdominal aorta under fluoroscopy. The needle was removed, and a micropuncture sheath was placed over the wire. The inner dilator and wire were removed, and an 035 Bentson wire was advanced under fluoroscopy into the abdominal aorta. The sheath was removed and a standard 5 [place] vascular sheath was placed. The dilator was removed and the sheath was flushed. Cobra catheter was use elect the celiac artery access. Angiogram was performed. Glidewire was used to navigate the base catheter into the common hepatic artery. Angiogram was performed. A penumbra lantern 025 lumen microcatheter, straight tip, was then navigated with 014 fathom wire into the GDA origin and beyond the site of pseudoaneurysm into the gastroepiploic artery. Angiogram performed, confirming position. Coil embolization was then performed of the gastroepiploic artery beyond the pseudoaneurysm origin then withdrawing the microcatheter into the pseudoaneurysm. Catheter was carefully  position within the central aspect of the filling pseudoaneurysm, and further coil embolization was performed with framing coils 20 mm x 60 mm both soft and standard. Microcatheter was then further withdrawn into the abnormal gastroduodenal artery with coil embolization performed with a combination of 3 mm and 4 mm standard/soft Ruby coils. Final angiogram was performed of the common hepatic artery. Angiogram was then performed of the superior mesenteric artery through the base catheter, with no residual contributing flow identified. Angio-Seal was then deployed at the right common femoral artery. Patient tolerated the procedure well and remained hemodynamically stable throughout. No complications were encountered and no significant blood loss. 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 Status post Angio-Seal for hemostasis. Signed, Dulcy Fanny. Dellia Nims, RPVI [facility] Radiology Electronically Signed   By: Corrie Mckusick D.O.   On: 11/11/2018 08:31   Ir Angiogram Selective Each Additional Vessel  Result Date: 11/11/2018 INDICATION: 58 year old male with a history abdominal pain, hematemesis, melena, with cross-sectional imaging demonstrating pseudoaneurysm of the GDA, uncertain etiology. He presents for urgent angiogram and coil embolization. EXAM: ULTRASOUND GUIDED ACCESS RIGHT COMMON FEMORAL ARTERY MESENTERIC ANGIOGRAM COIL EMBOLIZATION OF GASTRODUODENAL ARTERY INCLUDING PSEUDOANEURYSM ARISING FROM THE GDA. CLOSURE OF RIGHT COMMON FEMORAL ARTERY WITH ANGIO-SEAL. MEDICATIONS: NONE ANESTHESIA/SEDATION: Moderate (conscious) sedation was employed during this procedure. A total of Versed 2.0 mg and Fentanyl 100 mcg  was  administered intravenously. Moderate Sedation Time: 56 minutes. The patient's level of consciousness and vital signs were monitored continuously by radiology nursing throughout the procedure under my direct supervision. CONTRAST:  80 cc FLUOROSCOPY TIME:  Fluoroscopy Time: 11 minutes 12 seconds (544 mGy). COMPLICATIONS: None PROCEDURE: Informed consent was obtained from the patient following explanation of the procedure, risks, benefits and alternatives. The patient understands, agrees and consents for the procedure. All questions were addressed. A time out was performed prior to the initiation of the procedure. Maximal barrier sterile technique utilized including caps, mask, sterile gowns, sterile gloves, large sterile drape, hand hygiene, and Betadine prep. Ultrasound survey of the right inguinal region was performed with images stored and sent to PACs, confirming patency of the vessel. 1% lidocaine was used for local anesthesia. Stab incision was made with 11 blade scalpel and blunt dissection was performed with ultrasound guidance. A micropuncture needle was then used access the right common femoral artery under ultrasound. With excellent arterial blood flow returned, and an .018 micro wire was passed through the needle, observed enter the abdominal aorta under fluoroscopy. The needle was removed, and a micropuncture sheath was placed over the wire. The inner dilator and wire were removed, and an 035 Bentson wire was advanced under fluoroscopy into the abdominal aorta. The sheath was removed and a standard 5 [place] vascular sheath was placed. The dilator was removed and the sheath was flushed. Cobra catheter was use elect the celiac artery access. Angiogram was performed. Glidewire was used to navigate the base catheter into the common hepatic artery. Angiogram was performed. A penumbra lantern 025 lumen microcatheter, straight tip, was then navigated with 014 fathom wire into the GDA origin and beyond the site  of pseudoaneurysm into the gastroepiploic artery. Angiogram performed, confirming position. Coil embolization was then performed of the gastroepiploic artery beyond the pseudoaneurysm origin then withdrawing the microcatheter into the pseudoaneurysm. Catheter was carefully position within the central aspect of the filling pseudoaneurysm, and further coil embolization was performed with framing coils 20 mm x 60 mm both soft and standard. Microcatheter was then further withdrawn into the abnormal gastroduodenal artery with coil embolization performed with a combination of 3 mm and 4 mm standard/soft Ruby coils. Final angiogram was performed of the common hepatic artery. Angiogram was then performed of the superior mesenteric artery through the base catheter, with no residual contributing flow identified. Angio-Seal was then deployed at the right common femoral artery. Patient tolerated the procedure well and remained hemodynamically stable throughout. No complications were encountered and no significant blood loss. 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 Status post Angio-Seal for hemostasis. Signed, Dulcy Fanny. Dellia Nims, RPVI [facility] Radiology Electronically Signed   By: Corrie Mckusick D.O.   On: 11/11/2018 08:31   Ir US Guide Vasc Access Right  Result Date: 11/11/2018 INDICATION: 58 year old male with a history abdominal pain, hematemesis, melena, with cross-sectional imaging demonstrating pseudoaneurysm of the GDA, uncertain etiology. He presents for urgent angiogram and coil embolization. EXAM: ULTRASOUND GUIDED ACCESS RIGHT COMMON FEMORAL ARTERY MESENTERIC ANGIOGRAM COIL EMBOLIZATION OF GASTRODUODENAL ARTERY  INCLUDING PSEUDOANEURYSM ARISING FROM THE GDA. CLOSURE OF RIGHT COMMON FEMORAL ARTERY WITH ANGIO-SEAL. MEDICATIONS: NONE ANESTHESIA/SEDATION: Moderate (conscious) sedation was employed during this procedure. A total of Versed 2.0  mg and Fentanyl 100 mcg was administered intravenously. Moderate Sedation Time: 56 minutes. The patient's level of consciousness and vital signs were monitored continuously by radiology nursing throughout the procedure under my direct supervision. CONTRAST:  80 cc FLUOROSCOPY TIME:  Fluoroscopy Time: 11 minutes 12 seconds (544 mGy). COMPLICATIONS: None PROCEDURE: Informed consent was obtained from the patient following explanation of the procedure, risks, benefits and alternatives. The patient understands, agrees and consents for the procedure. All questions were addressed. A time out was performed prior to the initiation of the procedure. Maximal barrier sterile technique utilized including caps, mask, sterile gowns, sterile gloves, large sterile drape, hand hygiene, and Betadine prep. Ultrasound survey of the right inguinal region was performed with images stored and sent to PACs, confirming patency of the vessel. 1% lidocaine was used for local anesthesia. Stab incision was made with 11 blade scalpel and blunt dissection was performed with ultrasound guidance. A micropuncture needle was then used access the right common femoral artery under ultrasound. With excellent arterial blood flow returned, and an .018 micro wire was passed through the needle, observed enter the abdominal aorta under fluoroscopy. The needle was removed, and a micropuncture sheath was placed over the wire. The inner dilator and wire were removed, and an 035 Bentson wire was advanced under fluoroscopy into the abdominal aorta. The sheath was removed and a standard 5 [place] vascular sheath was placed. The dilator was removed and the sheath was flushed. Cobra catheter was use elect the celiac artery access. Angiogram  was performed. Glidewire was used to navigate the base catheter into the common hepatic artery. Angiogram was performed. A penumbra lantern 025 lumen microcatheter, straight tip, was then navigated with 014 fathom wire into the GDA origin and beyond the site of pseudoaneurysm into the gastroepiploic artery. Angiogram performed, confirming position. Coil embolization was then performed of the gastroepiploic artery beyond the pseudoaneurysm origin then withdrawing the microcatheter into the pseudoaneurysm. Catheter was carefully position within the central aspect of the filling pseudoaneurysm, and further coil embolization was performed with framing coils 20 mm x 60 mm both soft and standard. Microcatheter was then further withdrawn into the abnormal gastroduodenal artery with coil embolization performed with a combination of 3 mm and 4 mm standard/soft Ruby coils. Final angiogram was performed of the common hepatic artery. Angiogram was then performed of the superior mesenteric artery through the base catheter, with no residual contributing flow identified. Angio-Seal was then deployed at the right common femoral artery. Patient tolerated the procedure well and remained hemodynamically stable throughout. No complications were encountered and no significant blood loss. 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 Status post Angio-Seal for hemostasis. Signed, Dulcy Fanny. Dellia Nims, RPVI [facility] Radiology Electronically Signed   By: Corrie Mckusick D.O.   On: 11/11/2018 08:31   Ct Pancreas Abd W/wo  Result Date: 11/10/2018 CLINICAL DATA:  58 year old male with a history melena, prior hematemesis and pancreatitis. Prior MRI  11/09/2018 suggests complex mass of the pancreatic head as well as a pseudoaneurysm. EXAM: CT ABDOMEN WITHOUT AND WITH CONTRAST TECHNIQUE: Multidetector CT imaging of the abdomen was performed following the standard protocol before and following the bolus administration of intravenous contrast. CONTRAST:  <See Chart> [id number] IOPAMIDOL (ISOVUE-370) INJECTION 76% COMPARISON:  MRI 11/09/2018 FINDINGS:  Lower chest: Atelectatic changes at the right lung base. Trace pleural effusions bilateral lower lungs. Hepatobiliary: Delayed CT images demonstrate uniform enhancement/attenuation of the liver parenchyma with the early phase imaging patchy attenuation likely related to flow phenomenon. There is trace intrahepatic ductal dilatation of both right and left-sided ducts. Gallbladder somewhat distended with no radiopaque stones within the gallbladder lumen. Trace pericholecystic fluid without evidence of gallbladder wall thickening. Ascites adjacent to the liver in the subdiaphragmatic space extends below the liver within the pericolic gutter. Ill-defined heterogeneous attenuation mass continuous with the pancreatic head extends towards the gastrohepatic ligament in the liver hilum. Questionable portal caval nodes, inseparable from the caudate (image 46 of series 9). Pancreas: CT demonstrates pancreatic ductal dilatation involving the tail the pancreas and body of the pancreas, with the dilatation cutting off at heterogeneous soft tissue mass involving the pancreatic head (image 7 of series 14). At this same level there is abrupt termination/cutoff of the common bile duct. (Image 7 of series 14). Heterogeneous soft tissue mass at this location represents a pseudoaneurysm measuring at least 6.8 cm in AP dimension on image 9 of series 14. There is near complete thrombosis of the pseudoaneurysm, with flow maintained anteriorly. The mass in the head of the pancreas compresses the portal vein, just beyond the confluence with  superior mesenteric vein to a thread of contrast on image 59 of series 9. More superiorly there is heterogeneous hypoattenuating bilobed mass just superior to the common hepatic artery measuring 6.4 cm by 2.8 cm. This mass appear to have cystic configuration on the prior MRI with internal septations. Ill-defined inflammatory changes surrounding the body and tail of the pancreas, with haziness of the adjacent fat. Spleen: Greatest dimension of spleen 13 cm on the axial images. Adrenals/Urinary Tract: Unremarkable appearance of the adrenal glands. No evidence of hydronephrosis of the right or left kidney. No nephrolithiasis. Unremarkable course of the bilateral ureters. Unremarkable appearance of the urinary bladder. Stomach/Bowel: Unremarkable appearance of stomach. Inflammatory changes of the upper abdomen are continuous with the proximal duodenum. No evidence of gastric outlet obstruction. Small bowel is decompressed with no focal wall thickening. Normal appendix. The cecum is unremarkable without significant distension. Inflammatory changes of the upper abdomen are continuous with the hepatic flexure. Colonic diverticular present of the transverse colon, splenic flexure, sigmoid colon. Vascular/Lymphatic: No significant atherosclerotic changes. Small lymph nodes in the retroperitoneum. Low-density ascites in the dependent pelvis as well as adjacent to the liver. Portal vein is compressed just beyond the superior mesenteric vein confluence. No portal vein thrombus is identified. There is significant collateral formation of portal to portal collaterals via the gastroepiploic vasculature and at the hilum of the liver. Unremarkable appearance of the iliac veins. Other: Trace abdominal wall edema. e. Musculoskeletal: No acute displaced fracture. 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 This termination of the pancreatic duct occurs at a similar termination of the common bile duct, both are which are compressed/obstructed by heterogeneously attenuating/enhancing mass. The current CT resolution does not differentiate whether the mass effect is from the partially thrombosed pseudoaneurysm, or if there is a superimposed primary pancreatic mass at the pancreatic head. Preliminary results were discussed by telephone at the time of interpretation on 11/10/2018 at about 3 p.m. with Dr. Carol Ada , who verbally acknowledged these results. There are additional heterogeneously hypoattenuating/hypoenhancing lesions at the  hilum of the liver, superior to the common hepatic artery, which appear to represent pseudocyst formation on prior MRI. Repeat cross-sectional imaging with CT or MRI may be useful once the pseudoaneurysm has been treated in order to increase specificity of findings in the pancreatic head, as neoplasm is not excluded. 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 Questionable pathologic adenopathy in the portacaval nodal stations and liver hilum, as well as adjacent to the second portion of the duodenum, increasing the suspicion of occult malignancy. Trace ascites. Mild body wall edema/anasarca with bilateral pleural effusions. Electronically Signed   By: Corrie Mckusick D.O.   On: 11/10/2018 16:19   [employer]  Result Date: 11/11/2018 INDICATION: 58 year old male with a history abdominal pain, hematemesis, melena, with cross-sectional imaging demonstrating pseudoaneurysm of the GDA, uncertain etiology. He presents for urgent angiogram and coil embolization. EXAM: ULTRASOUND GUIDED ACCESS RIGHT COMMON FEMORAL ARTERY MESENTERIC ANGIOGRAM COIL EMBOLIZATION  OF GASTRODUODENAL ARTERY INCLUDING PSEUDOANEURYSM ARISING FROM THE GDA. CLOSURE OF RIGHT COMMON FEMORAL ARTERY WITH ANGIO-SEAL. MEDICATIONS: NONE ANESTHESIA/SEDATION: Moderate (conscious) sedation was employed during this procedure. A total of Versed 2.0 mg and Fentanyl 100 mcg was administered intravenously. Moderate Sedation Time: 56 minutes. The patient's level of consciousness and vital signs were monitored continuously by radiology nursing throughout the procedure under my direct supervision. CONTRAST:  80 cc FLUOROSCOPY TIME:  Fluoroscopy Time: 11 minutes 12 seconds (544 mGy). COMPLICATIONS: None PROCEDURE: Informed consent was obtained from the patient following explanation of the procedure, risks, benefits and alternatives. The patient understands, agrees and consents for the procedure. All questions were addressed. A time out was performed prior to the initiation of the procedure. Maximal barrier sterile technique utilized including caps, mask, sterile gowns, sterile gloves, large sterile drape, hand hygiene, and Betadine prep. Ultrasound survey of the right inguinal region was performed with images stored and sent to PACs, confirming patency of the vessel. 1% lidocaine was used for local anesthesia. Stab incision was made with 11 blade scalpel and blunt dissection was performed with ultrasound guidance. A micropuncture needle was then used access the right common femoral artery under ultrasound. With excellent arterial blood flow returned, and an .018 micro wire was passed through the needle, observed enter the abdominal aorta under fluoroscopy. The needle was removed, and a micropuncture sheath was placed over the wire. The inner dilator and wire were removed, and an 035 Bentson wire was advanced under fluoroscopy into the abdominal aorta. The sheath was removed and a standard 5 [place] vascular sheath was placed. The dilator was removed and the sheath was flushed. Cobra catheter was use elect the celiac  artery access. Angiogram was performed. Glidewire was used to navigate the base catheter into the common hepatic artery. Angiogram was performed. A penumbra lantern 025 lumen microcatheter, straight tip, was then navigated with 014 fathom wire into the GDA origin and beyond the site of pseudoaneurysm into the gastroepiploic artery. Angiogram performed, confirming position. Coil embolization was then performed of the gastroepiploic artery beyond the pseudoaneurysm origin then withdrawing the microcatheter into the pseudoaneurysm. Catheter was carefully position within the central aspect of the filling pseudoaneurysm, and further coil embolization was performed with framing coils 20 mm x 60 mm both soft and standard. Microcatheter was then further withdrawn into the abnormal gastroduodenal artery with coil embolization performed with a combination of 3 mm and  4 mm standard/soft Ruby coils. Final angiogram was performed of the common hepatic artery. Angiogram was then performed of the superior mesenteric artery through the base catheter, with no residual contributing flow identified. Angio-Seal was then deployed at the right common femoral artery. Patient tolerated the procedure well and remained hemodynamically stable throughout. No complications were encountered and no significant blood loss. 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 Status post Angio-Seal for hemostasis. Signed, Dulcy Fanny. Dellia Nims, RPVI [facility] Electronically Signed   By: Corrie Mckusick D.O.   On: 11/11/2018 08:31    Assessment/Plan: S/p visceral angio with extensive coil embo of GDA including large pseudoaneurysm Hemodynamically  stable. Hgb up to 9.1 Ok to ambulate in halls. Other plans per primary/GI    LOS: 7 days   I spent a total of 15 minutes in face to face in clinical consultation, greater than 50% of which was counseling/coordinating care for visceral angio/embo  Ascencion Dike PA-C 11/12/2018 9:17 AM

## 2018-11-12 NOTE — Progress Notes (Signed)
PROGRESS NOTE    Joe [name]   NGE:[id number]  DOB: 1960/11/06  DOA: 11/05/2018 PCP: Mack Hook, MD   Brief Narrative:  Joe [name] 58 year old with history only of hypertension came into the hospital on 1/2 with epigastric pain for the last 3 days along with bloody emesis and melena, no prior history of GI bleed in the past.  He reports drinking 1-2 beers a day and taking Aleve over the last few days due to abdominal pain.  His abdominal pain radiates into the back.  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 Is still passing bloody stools.     Assessment & Plan:   Principal Problem:   GI bleed - with maroon stools - status post EGD1/3/20 which did not show any clear evidence of a source for bleed. - colonoscopy on 11/08/18 without acute findings -  capsule endoscop  11/08/2018- no source of bleeding found -1/7 > mesenteric angiogram-  s/p coiling of Gastroduodenal artery pseudoaneurysm - Hb remains stable and I suspect the blood he is passing is old - GI plans to repeat a bleeding scan if he is bleeding   Active Problems: Acute blood loss anemia - has received 5 u PRBC so far- cont to follow Hb- has not needed blood since 1/7  Pancreatic mass/ acute pancreatitis - Abdominal ultrasound>  pancreatic head solid and cystic mass - - EUS > mass on pancreatic head, dilation in the middle third of themain bile duct and the upper third of the mainbile  duct which measured up to 13 mm. - GI recommends repeat imaging in 2-3 months    Hypertension - Losartan/HCTZ  on hold  SVT - occured on 11/11/18 - follow on telemetry       Time spent in minutes: 35 DVT prophylaxis: SCDs Code Status: Full code Family Communication:  Disposition Plan: per GI Consultants:   GI Procedures:   EGD, Colonoscopy, EUS, Capsule endoscopy  Mesenteric angiogram Antimicrobials:  Anti-infectives (From admission, onward)   None       Objective: Vitals:   11/12/18 0542 11/12/18 0842 11/12/18 1311 11/12/18 1529  BP: 125/86 (!) 138/95 123/85 112/74  Pulse: 60 62 64 65  Resp:      Temp: 98.4 F (36.9 C)   98.6 F (37 C)  TempSrc: Oral     SpO2: 100%   100%  Weight:      Height:       No intake or output data in the 24 hours ending 11/12/18 1558 Filed Weights   11/05/18 0958 11/05/18 1540  Weight: 73.9 kg 72 kg    Examination: General exam: Appears comfortable  HEENT: PERRLA, oral mucosa moist, no sclera icterus or thrush Respiratory system: Clear to auscultation. Respiratory effort normal. Cardiovascular system: S1 & S2 heard, RRR.   Gastrointestinal system: Abdomen soft, tender in mid abdomen, nondistended. Normal bowel sounds. Central nervous system: Alert and oriented. No focal neurological deficits. Extremities: No cyanosis, clubbing or edema Skin: No rashes or ulcers Psychiatry:  Mood &  affect appropriate.     Data Reviewed: I have personally reviewed following labs and imaging studies  CBC: Recent Labs  Lab 11/07/18 0437  11/09/18 0455 11/09/18 1403 11/10/18 0517 11/11/18 0412 11/12/18 0419  WBC 5.0  --  4.2  --  3.6* 3.7* 4.0  HGB 8.1*   < > 8.0* 8.1* 7.4* 8.5* 9.1*  HCT 25.3*  --  24.7*  --  22.9* 26.8* 27.9*  MCV 79.3*  --  79.2*  --  79.0* 79.3* 80.6  PLT 221  --  205  --  207 213 249   < > = values in this interval not displayed.   Basic Metabolic Panel: Recent Labs  Lab 11/07/18 0437 11/09/18 0455  11/10/18 0517 11/11/18 0412 11/12/18 0419  NA 134* 136 138 137 137  K 3.6 3.0* 3.8 3.5 3.9  CL 105 105 106 104 103  CO2 23 24 24 28 27   GLUCOSE 91 98 93 88 96  BUN 7 5* 5* 5* 7  CREATININE 0.70 0.82 0.86 0.81 0.83  CALCIUM 7.9* 7.7* 8.0* 8.1* 8.1*   GFR: Estimated Creatinine Clearance: 100 mL/min (by C-G formula based on SCr of 0.83 mg/dL). Liver Function Tests: Recent Labs  Lab 11/07/18 0437 11/09/18 0455 11/10/18 0517 11/11/18 0412  AST 17 14* 14* 15  ALT 19 14 13 13   ALKPHOS 45 57 70 66  BILITOT 1.1 1.3* 1.2 1.6*  PROT 4.7* 4.9* 4.6* 4.9*  ALBUMIN 2.5* 2.3* 2.2* 2.2*   Recent Labs  Lab 11/06/18 0236  LIPASE 73*   No results for input(s): AMMONIA in the last 168 hours. Coagulation Profile: No results for input(s): INR, PROTIME in the last 168 hours. Cardiac Enzymes: No results for input(s): CKTOTAL, CKMB, CKMBINDEX, TROPONINI in the last 168 hours. BNP (last 3 results) No results for input(s): PROBNP in the last 8760 hours. HbA1C: No results for input(s): HGBA1C in the last 72 hours. CBG: No results for input(s): GLUCAP in the last 168 hours. Lipid Profile: No results for input(s): CHOL, HDL, LDLCALC, TRIG, CHOLHDL, LDLDIRECT in the last 72 hours. Thyroid Function Tests: No results for input(s): TSH, T4TOTAL, FREET4, T3FREE, THYROIDAB in the last 72 hours. Anemia Panel: No results for input(s): VITAMINB12, FOLATE, FERRITIN, TIBC, IRON, RETICCTPCT in the last 72 hours. Urine analysis:    Component Value Date/Time   COLORURINE YELLOW 01/16/2017 0942   APPEARANCEUR CLEAR 01/16/2017 0942   LABSPEC 1.019 01/16/2017 0942   PHURINE 6.0 01/16/2017 0942   GLUCOSEU NEGATIVE 01/16/2017 0942   HGBUR NEGATIVE 01/16/2017 0942   BILIRUBINUR NEGATIVE 01/16/2017 0942   KETONESUR NEGATIVE 01/16/2017 0942   PROTEINUR NEGATIVE 01/16/2017 0942   NITRITE NEGATIVE 01/16/2017 0942   LEUKOCYTESUR NEGATIVE 01/16/2017 0942   Sepsis  Labs: @LABRCNTIP (procalcitonin:4,lacticidven:4) )No results found for this or any previous visit (from the past 240 hour(s)).       Radiology Studies: Ir Angiogram Visceral Selective  Result Date: 11/11/2018 INDICATION: 58 year old male with a history abdominal pain, hematemesis, melena, with cross-sectional imaging demonstrating pseudoaneurysm of the GDA, uncertain etiology. He presents for urgent angiogram and coil embolization. EXAM: ULTRASOUND GUIDED ACCESS RIGHT COMMON FEMORAL ARTERY MESENTERIC ANGIOGRAM COIL EMBOLIZATION OF GASTRODUODENAL ARTERY INCLUDING PSEUDOANEURYSM ARISING FROM THE GDA. CLOSURE OF RIGHT COMMON FEMORAL ARTERY WITH ANGIO-SEAL. MEDICATIONS: NONE ANESTHESIA/SEDATION: Moderate (conscious) sedation was employed during this procedure. A total of Versed 2.0 mg and Fentanyl 100 mcg was administered intravenously. Moderate Sedation Time: 56 minutes. The patient's level of consciousness and vital signs were monitored continuously  by radiology nursing throughout the procedure under my direct supervision. CONTRAST:  80 cc FLUOROSCOPY TIME:  Fluoroscopy Time: 11 minutes 12 seconds (544 mGy). COMPLICATIONS: None PROCEDURE: Informed consent was obtained from the patient following explanation of the procedure, risks, benefits and alternatives. The patient understands, agrees and consents for the procedure. All questions were addressed. A time out was performed prior to the initiation of the procedure. Maximal barrier sterile technique utilized including caps, mask, sterile gowns, sterile gloves, large sterile drape, hand hygiene, and Betadine prep. Ultrasound survey of the right inguinal region was performed with images stored and sent to PACs, confirming patency of the vessel. 1% lidocaine was used for local anesthesia. Stab incision was made with 11 blade scalpel and blunt dissection was performed with ultrasound guidance. A micropuncture needle was then used access the right common femoral  artery under ultrasound. With excellent arterial blood flow returned, and an .018 micro wire was passed through the needle, observed enter the abdominal aorta under fluoroscopy. The needle was removed, and a micropuncture sheath was placed over the wire. The inner dilator and wire were removed, and an 035 Bentson wire was advanced under fluoroscopy into the abdominal aorta. The sheath was removed and a standard 5 [place] vascular sheath was placed. The dilator was removed and the sheath was flushed. Cobra catheter was use elect the celiac artery access. Angiogram was performed. Glidewire was used to navigate the base catheter into the common hepatic artery. Angiogram was performed. A penumbra lantern 025 lumen microcatheter, straight tip, was then navigated with 014 fathom wire into the GDA origin and beyond the site of pseudoaneurysm into the gastroepiploic artery. Angiogram performed, confirming position. Coil embolization was then performed of the gastroepiploic artery beyond the pseudoaneurysm origin then withdrawing the microcatheter into the pseudoaneurysm. Catheter was carefully position within the central aspect of the filling pseudoaneurysm, and further coil embolization was performed with framing coils 20 mm x 60 mm both soft and standard. Microcatheter was then further withdrawn into the abnormal gastroduodenal artery with coil embolization performed with a combination of 3 mm and 4 mm standard/soft Ruby coils. Final angiogram was performed of the common hepatic artery. Angiogram was then performed of the superior mesenteric artery through the base catheter, with no residual contributing flow identified. Angio-Seal was then deployed at the right common femoral artery. Patient tolerated the procedure well and remained hemodynamically stable throughout. No complications were encountered and no significant blood loss. IMPRESSION: Status post ultrasound guided access of right common femoral artery for  mesenteric angiogram and treatment of pseudoaneurysm arising from the gastroduodenal artery with coil embolization across the neck of the pseudoaneurysm, into the pseudoaneurysm, and to the origin of the GDA. Status post Angio-Seal for hemostasis. Signed, Dulcy Fanny. Dellia Nims, RPVI [facility] Radiology Electronically Signed   By: Corrie Mckusick D.O.   On: 11/11/2018 08:31   Ir Angiogram Visceral Selective  Result Date: 11/11/2018 INDICATION: 58 year old male with a history abdominal pain, hematemesis, melena, with cross-sectional imaging demonstrating pseudoaneurysm of the GDA, uncertain etiology. He presents for urgent angiogram and coil embolization. EXAM: ULTRASOUND GUIDED ACCESS RIGHT COMMON FEMORAL ARTERY MESENTERIC ANGIOGRAM COIL EMBOLIZATION OF GASTRODUODENAL ARTERY INCLUDING PSEUDOANEURYSM ARISING FROM THE GDA. CLOSURE OF RIGHT COMMON FEMORAL ARTERY WITH ANGIO-SEAL. MEDICATIONS: NONE ANESTHESIA/SEDATION: Moderate (conscious) sedation was employed during this procedure. A total of Versed 2.0 mg and Fentanyl 100 mcg was administered intravenously. Moderate Sedation Time: 56 minutes. The patient's level of consciousness and vital signs were  monitored continuously by radiology nursing throughout the procedure under my direct supervision. CONTRAST:  80 cc FLUOROSCOPY TIME:  Fluoroscopy Time: 11 minutes 12 seconds (544 mGy). COMPLICATIONS: None PROCEDURE: Informed consent was obtained from the patient following explanation of the procedure, risks, benefits and alternatives. The patient understands, agrees and consents for the procedure. All questions were addressed. A time out was performed prior to the initiation of the procedure. Maximal barrier sterile technique utilized including caps, mask, sterile gowns, sterile gloves, large sterile drape, hand hygiene, and Betadine prep. Ultrasound survey of the right inguinal region was performed with images stored and  sent to PACs, confirming patency of the vessel. 1% lidocaine was used for local anesthesia. Stab incision was made with 11 blade scalpel and blunt dissection was performed with ultrasound guidance. A micropuncture needle was then used access the right common femoral artery under ultrasound. With excellent arterial blood flow returned, and an .018 micro wire was passed through the needle, observed enter the abdominal aorta under fluoroscopy. The needle was removed, and a micropuncture sheath was placed over the wire. The inner dilator and wire were removed, and an 035 Bentson wire was advanced under fluoroscopy into the abdominal aorta. The sheath was removed and a standard 5 [place] vascular sheath was placed. The dilator was removed and the sheath was flushed. Cobra catheter was use elect the celiac artery access. Angiogram was performed. Glidewire was used to navigate the base catheter into the common hepatic artery. Angiogram was performed. A penumbra lantern 025 lumen microcatheter, straight tip, was then navigated with 014 fathom wire into the GDA origin and beyond the site of pseudoaneurysm into the gastroepiploic artery. Angiogram performed, confirming position. Coil embolization was then performed of the gastroepiploic artery beyond the pseudoaneurysm origin then withdrawing the microcatheter into the pseudoaneurysm. Catheter was carefully position within the central aspect of the filling pseudoaneurysm, and further coil embolization was performed with framing coils 20 mm x 60 mm both soft and standard. Microcatheter was then further withdrawn into the abnormal gastroduodenal artery with coil embolization performed with a combination of 3 mm and 4 mm standard/soft Ruby coils. Final angiogram was performed of the common hepatic artery. Angiogram was then performed of the superior mesenteric artery through the base catheter, with no residual contributing flow identified. Angio-Seal was then deployed at the  right common femoral artery. Patient tolerated the procedure well and remained hemodynamically stable throughout. No complications were encountered and no significant blood loss. 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 Status post Angio-Seal for hemostasis. Signed, Dulcy Fanny. Dellia Nims, RPVI [facility] Radiology Electronically Signed   By: Corrie Mckusick D.O.   On: 11/11/2018 08:31   Ir Angiogram Selective Each Additional Vessel  Result Date: 11/11/2018 INDICATION: 58 year old male with a history abdominal pain, hematemesis, melena, with cross-sectional imaging demonstrating pseudoaneurysm of the GDA, uncertain etiology. He presents for urgent angiogram and coil embolization. EXAM: ULTRASOUND GUIDED ACCESS RIGHT COMMON FEMORAL ARTERY MESENTERIC ANGIOGRAM COIL EMBOLIZATION OF GASTRODUODENAL ARTERY INCLUDING PSEUDOANEURYSM ARISING FROM THE GDA. CLOSURE OF RIGHT COMMON FEMORAL ARTERY WITH ANGIO-SEAL. MEDICATIONS: NONE ANESTHESIA/SEDATION: Moderate (conscious) sedation was employed during this procedure. A total of Versed 2.0 mg and Fentanyl 100 mcg was administered intravenously. Moderate Sedation Time: 56 minutes. The patient's level of consciousness  and vital signs were monitored continuously by radiology nursing throughout the procedure under my direct supervision. CONTRAST:  80 cc FLUOROSCOPY TIME:  Fluoroscopy Time: 11 minutes 12 seconds (544 mGy). COMPLICATIONS: None PROCEDURE: Informed consent was obtained from the patient following explanation of the procedure, risks, benefits and alternatives. The patient understands, agrees and consents for the procedure. All questions were addressed. A time out was performed  prior to the initiation of the procedure. Maximal barrier sterile technique utilized including caps, mask, sterile gowns, sterile gloves, large sterile drape, hand hygiene, and Betadine prep. Ultrasound survey of the right inguinal region was performed with images stored and sent to PACs, confirming patency of the vessel. 1% lidocaine was used for local anesthesia. Stab incision was made with 11 blade scalpel and blunt dissection was performed with ultrasound guidance. A micropuncture needle was then used access the right common femoral artery under ultrasound. With excellent arterial blood flow returned, and an .018 micro wire was passed through the needle, observed enter the abdominal aorta under fluoroscopy. The needle was removed, and a micropuncture sheath was placed over the wire. The inner dilator and wire were removed, and an 035 Bentson wire was advanced under fluoroscopy into the abdominal aorta. The sheath was removed and a standard 5 [place] vascular sheath was placed. The dilator was removed and the sheath was flushed. Cobra catheter was use elect the celiac artery access. Angiogram was performed. Glidewire was used to navigate the base catheter into the common hepatic artery. Angiogram was performed. A penumbra lantern 025 lumen microcatheter, straight tip, was then navigated with 014 fathom wire into the GDA origin and beyond the site of pseudoaneurysm into the gastroepiploic artery. Angiogram performed, confirming position. Coil embolization was then performed of the gastroepiploic artery beyond the pseudoaneurysm origin then withdrawing the microcatheter into the pseudoaneurysm. Catheter was carefully position within the central aspect of the filling pseudoaneurysm, and further coil embolization was performed with framing coils 20 mm x 60 mm both soft and standard. Microcatheter was then further withdrawn into the abnormal gastroduodenal artery with coil embolization performed with a combination of 3  mm and 4 mm standard/soft Ruby coils. Final angiogram was performed of the common hepatic artery. Angiogram was then performed of the superior mesenteric artery through the base catheter, with no residual contributing flow identified. Angio-Seal was then deployed at the right common femoral artery. Patient tolerated the procedure well and remained hemodynamically stable throughout. No complications were encountered and no significant blood loss. 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 Status post Angio-Seal for hemostasis. Signed, Dulcy Fanny. Dellia Nims, RPVI [facility] Radiology Specialists [facility] Radiology Electronically Signed   By: Corrie Mckusick D.O.   On: 11/11/2018 08:31   Ir US Guide Vasc Access Right  Result Date: 11/11/2018 INDICATION: 58 year old male with a history abdominal pain, hematemesis, melena, with cross-sectional imaging demonstrating pseudoaneurysm of the GDA, uncertain etiology. He presents for urgent angiogram and coil embolization. EXAM: ULTRASOUND GUIDED ACCESS RIGHT COMMON FEMORAL ARTERY MESENTERIC ANGIOGRAM COIL EMBOLIZATION OF GASTRODUODENAL ARTERY INCLUDING PSEUDOANEURYSM ARISING FROM THE GDA. CLOSURE OF RIGHT COMMON FEMORAL ARTERY WITH ANGIO-SEAL. MEDICATIONS: NONE ANESTHESIA/SEDATION: Moderate (conscious) sedation was employed during this procedure. A total of Versed 2.0 mg and Fentanyl 100 mcg was administered intravenously. Moderate Sedation Time: 56 minutes. The  patient's level of consciousness and vital signs were monitored continuously by radiology nursing throughout the procedure under my direct supervision. CONTRAST:  80 cc FLUOROSCOPY TIME:  Fluoroscopy Time: 11 minutes 12 seconds (544 mGy). COMPLICATIONS:  None PROCEDURE: Informed consent was obtained from the patient following explanation of the procedure, risks, benefits and alternatives. The patient understands, agrees and consents for the procedure. All questions were addressed. A time out was performed prior to the initiation of the procedure. Maximal barrier sterile technique utilized including caps, mask, sterile gowns, sterile gloves, large sterile drape, hand hygiene, and Betadine prep. Ultrasound survey of the right inguinal region was performed with images stored and sent to PACs, confirming patency of the vessel. 1% lidocaine was used for local anesthesia. Stab incision was made with 11 blade scalpel and blunt dissection was performed with ultrasound guidance. A micropuncture needle was then used access the right common femoral artery under ultrasound. With excellent arterial blood flow returned, and an .018 micro wire was passed through the needle, observed enter the abdominal aorta under fluoroscopy. The needle was removed, and a micropuncture sheath was placed over the wire. The inner dilator and wire were removed, and an 035 Bentson wire was advanced under fluoroscopy into the abdominal aorta. The sheath was removed and a standard 5 [place] vascular sheath was placed. The dilator was removed and the sheath was flushed. Cobra catheter was use elect the celiac artery access. Angiogram was performed. Glidewire was used to navigate the base catheter into the common hepatic artery. Angiogram was performed. A penumbra lantern 025 lumen microcatheter, straight tip, was then navigated with 014 fathom wire into the GDA origin and beyond the site of pseudoaneurysm into the gastroepiploic artery. Angiogram performed, confirming position. Coil embolization was then performed of the gastroepiploic artery beyond the pseudoaneurysm origin then withdrawing the microcatheter into the pseudoaneurysm. Catheter was carefully position within the central aspect of the  filling pseudoaneurysm, and further coil embolization was performed with framing coils 20 mm x 60 mm both soft and standard. Microcatheter was then further withdrawn into the abnormal gastroduodenal artery with coil embolization performed with a combination of 3 mm and 4 mm standard/soft Ruby coils. Final angiogram was performed of the common hepatic artery. Angiogram was then performed of the superior mesenteric artery through the base catheter, with no residual contributing flow identified. Angio-Seal was then deployed at the right common femoral artery. Patient tolerated the procedure well and remained hemodynamically stable throughout. No complications were encountered and no significant blood loss. 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 Status post Angio-Seal for hemostasis. Signed, Dulcy Fanny. Dellia Nims, RPVI [facility] Radiology Specialists [facility] Radiology Electronically Signed   By: Corrie Mckusick D.O.   On: 11/11/2018 08:31   [place] Guide Roadmapping  Result Date: 11/11/2018 INDICATION: 58 year old male with a history abdominal pain, hematemesis, melena, with cross-sectional imaging demonstrating pseudoaneurysm of the GDA, uncertain etiology. He presents for urgent angiogram and coil embolization. EXAM: ULTRASOUND GUIDED ACCESS RIGHT COMMON FEMORAL ARTERY MESENTERIC ANGIOGRAM COIL EMBOLIZATION OF GASTRODUODENAL ARTERY INCLUDING PSEUDOANEURYSM ARISING FROM THE GDA. CLOSURE OF RIGHT COMMON FEMORAL ARTERY WITH ANGIO-SEAL. MEDICATIONS: NONE ANESTHESIA/SEDATION: Moderate (conscious) sedation was employed during this procedure. A total of Versed 2.0 mg and Fentanyl 100  mcg was administered intravenously.  Moderate Sedation Time: 56 minutes. The patient's level of consciousness and vital signs were monitored continuously by radiology nursing throughout the procedure under my direct supervision. CONTRAST:  80 cc FLUOROSCOPY TIME:  Fluoroscopy Time: 11 minutes 12 seconds (544 mGy). COMPLICATIONS: None PROCEDURE: Informed consent was obtained from the patient following explanation of the procedure, risks, benefits and alternatives. The patient understands, agrees and consents for the procedure. All questions were addressed. A time out was performed prior to the initiation of the procedure. Maximal barrier sterile technique utilized including caps, mask, sterile gowns, sterile gloves, large sterile drape, hand hygiene, and Betadine prep. Ultrasound survey of the right inguinal region was performed with images stored and sent to PACs, confirming patency of the vessel. 1% lidocaine was used for local anesthesia. Stab incision was made with 11 blade scalpel and blunt dissection was performed with ultrasound guidance. A micropuncture needle was then used access the right common femoral artery under ultrasound. With excellent arterial blood flow returned, and an .018 micro wire was passed through the needle, observed enter the abdominal aorta under fluoroscopy. The needle was removed, and a micropuncture sheath was placed over the wire. The inner dilator and wire were removed, and an 035 Bentson wire was advanced under fluoroscopy into the abdominal aorta. The sheath was removed and a standard 5 [place] vascular sheath was placed. The dilator was removed and the sheath was flushed. Cobra catheter was use elect the celiac artery access. Angiogram was performed. Glidewire was used to navigate the base catheter into the common hepatic artery. Angiogram was performed. A penumbra lantern 025 lumen microcatheter, straight tip, was then navigated with 014 fathom wire into the GDA origin and beyond the site of pseudoaneurysm into the  gastroepiploic artery. Angiogram performed, confirming position. Coil embolization was then performed of the gastroepiploic artery beyond the pseudoaneurysm origin then withdrawing the microcatheter into the pseudoaneurysm. Catheter was carefully position within the central aspect of the filling pseudoaneurysm, and further coil embolization was performed with framing coils 20 mm x 60 mm both soft and standard. Microcatheter was then further withdrawn into the abnormal gastroduodenal artery with coil embolization performed with a combination of 3 mm and 4 mm standard/soft Ruby coils. Final angiogram was performed of the common hepatic artery. Angiogram was then performed of the superior mesenteric artery through the base catheter, with no residual contributing flow identified. Angio-Seal was then deployed at the right common femoral artery. Patient tolerated the procedure well and remained hemodynamically stable throughout. No complications were encountered and no significant blood loss. 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 Status post Angio-Seal for hemostasis. Signed, Dulcy Fanny. Dellia Nims, RPVI [facility] Electronically Signed   By: Corrie Mckusick D.O.   On: 11/11/2018 08:31      Scheduled Meds: . feeding supplement (ENSURE ENLIVE)  237 mL Oral TID BM  . folic acid  1 mg Oral Daily  . multivitamin with minerals  1 tablet Oral Daily  . pantoprazole (PROTONIX) IV  40 mg Intravenous Q12H  . thiamine  100 mg Oral Daily   Continuous Infusions: . sodium chloride 20 mL/hr at 11/10/18 1409     LOS: 7 days      Debbe Odea, MD [facility] Pager:  [email or web address] Password [facility] 11/12/2018,  3:58 PM

## 2018-11-13 LAB — CBC
HCT: 28.5 % — ABNORMAL LOW (ref 39.0–52.0)
HEMOGLOBIN: 8.8 g/dL — AB (ref 13.0–17.0)
MCH: 24.8 pg — ABNORMAL LOW (ref 26.0–34.0)
MCHC: 30.9 g/dL (ref 30.0–36.0)
MCV: 80.3 fL (ref 80.0–100.0)
NRBC: 0 % (ref 0.0–0.2)
Platelets: 256 10*3/uL (ref 150–400)
RBC: 3.55 MIL/uL — AB (ref 4.22–5.81)
RDW: 17.7 % — ABNORMAL HIGH (ref 11.5–15.5)
WBC: 4.3 10*3/uL (ref 4.0–10.5)

## 2018-11-13 MED ORDER — CIPROFLOXACIN HCL 0.3 % OP SOLN
2.0000 [drp] | OPHTHALMIC | Status: DC
  Administered 2018-11-13 – 2018-11-14 (×7): 2 [drp] via OPHTHALMIC
  Filled 2018-11-13: qty 2.5

## 2018-11-13 NOTE — Progress Notes (Signed)
PROGRESS NOTE    Joe [name]   OXB:[id number]  DOB: 02-Jun-1961  DOA: 11/05/2018 PCP: Mack Hook, MD   Brief Narrative:  Joe [name] 58 year old with history only of hypertension came into the hospital on 1/2 with epigastric pain for the last 3 days along with bloody emesis and melena, no prior history of GI bleed in the past.  He reports drinking 1-2 beers a day and taking Aleve over the last few days due to abdominal pain.  His abdominal pain radiates into the back.  He was found to be anemic in the ED with a hemoglobin of 7 down from 11.9,  gastroenterology consulted, extensive GI work-up including EGD, colonoscopy and video capsule failed to demonstrate source of bleeding, also EUS fails to demonstrate any significant no abnormalities of the common bile duct apart from noted significant ductal dilatation, subsequently patient had abdominal MRI and CTs showing pseudoaneurysm, on 11/10/2018 had Mesenteric angiogram with Coil embolization of pseudoaneurysm arising from the GDA by IR   Subjective: Still having mid abdominal pain. No bloody stools any longer.    Assessment & Plan:   Principal Problem:   GI bleed - with maroon stools - status post EGD1/3/20 which did not show any clear evidence of a source for bleed. - colonoscopy on 11/08/18 without acute findings -  capsule endoscop  11/08/2018- no source of bleeding found -1/7 > mesenteric angiogram-  s/p coiling of Gastroduodenal artery pseudoaneurysm - Hb remains stable   - GI plans to repeat a bleeding scan if he is further bleeding   Active Problems: Acute blood loss anemia - has received 5 u PRBC so far- cont to follow Hb- has not needed blood since 1/7  Pancreatic mass/ acute pancreatitis - Abdominal ultrasound>  pancreatic head solid and cystic mass - - EUS > mass on pancreatic head, dilation in the middle third of themain bile duct and the upper third of the mainbile duct which measured up to 13 mm. - GI  recommends repeat imaging in 2-3 months    Hypertension - Losartan/HCTZ  on hold  SVT - occured on 11/11/18 - follow on telemetry     Left eye conjunctivitis - Cipro ophthalmic solution started  Time spent in minutes: 35 DVT prophylaxis: SCDs Code Status: Full code Family Communication:  Disposition Plan: per GI Consultants:   GI Procedures:   EGD, Colonoscopy, EUS, Capsule endoscopy  Mesenteric angiogram Antimicrobials:  Anti-infectives (From admission, onward)   None       Objective: Vitals:   11/12/18 1857 11/12/18 2225 11/13/18 0526 11/13/18 1354  BP: 135/89 123/80 131/87 113/82  Pulse: 66 67 (!) 59 62  Resp:    18  Temp:  98.6 F (37 C) 98 F (36.7 C) 98 F (36.7 C)  TempSrc:  Oral Oral   SpO2:  99% 99% 100%  Weight:      Height:       No intake or output data in the 24 hours ending 11/13/18 1546 Filed Weights   11/05/18 0958 11/05/18 1540  Weight: 73.9 kg 72 kg    Examination: General exam: Appears comfortable  HEENT: PERRLA, oral mucosa moist, no sclera icterus or thrush Respiratory system: Clear to auscultation. Respiratory effort normal. Cardiovascular system: S1 & S2 heard, RRR.   Gastrointestinal system: Abdomen soft, still tender in mid abdomen, nondistended. Normal bowel sounds. Central nervous system: Alert and oriented. No focal neurological deficits. Extremities: No cyanosis, clubbing or edema Skin: No rashes or ulcers Psychiatry:  Mood & affect appropriate.     Data Reviewed: I have personally reviewed following labs and imaging studies  CBC: Recent Labs  Lab 11/09/18 0455 11/09/18 1403 11/10/18 0517 11/11/18 0412 11/12/18 0419 11/13/18 0901  WBC 4.2  --  3.6* 3.7* 4.0 4.3  HGB 8.0* 8.1* 7.4* 8.5* 9.1* 8.8*  HCT 24.7*  --  22.9* 26.8* 27.9* 28.5*  MCV 79.2*  --  79.0* 79.3* 80.6 80.3  PLT 205  --  207 213 249 010   Basic Metabolic Panel: Recent Labs  Lab 11/07/18 0437 11/09/18 0455 11/10/18 0517 11/11/18 0412  11/12/18 0419  NA 134* 136 138 137 137  K 3.6 3.0* 3.8 3.5 3.9  CL 105 105 106 104 103  CO2 23 24 24 28 27   GLUCOSE 91 98 93 88 96  BUN 7 5* 5* 5* 7  CREATININE 0.70 0.82 0.86 0.81 0.83  CALCIUM 7.9* 7.7* 8.0* 8.1* 8.1*   GFR: Estimated Creatinine Clearance: 100 mL/min (by C-G formula based on SCr of 0.83 mg/dL). Liver Function Tests: Recent Labs  Lab 11/07/18 0437 11/09/18 0455 11/10/18 0517 11/11/18 0412  AST 17 14* 14* 15  ALT 19 14 13 13   ALKPHOS 45 57 70 66  BILITOT 1.1 1.3* 1.2 1.6*  PROT 4.7* 4.9* 4.6* 4.9*  ALBUMIN 2.5* 2.3* 2.2* 2.2*   No results for input(s): LIPASE, AMYLASE in the last 168 hours. No results for input(s): AMMONIA in the last 168 hours. Coagulation Profile: No results for input(s): INR, PROTIME in the last 168 hours. Cardiac Enzymes: No results for input(s): CKTOTAL, CKMB, CKMBINDEX, TROPONINI in the last 168 hours. BNP (last 3 results) No results for input(s): PROBNP in the last 8760 hours. HbA1C: No results for input(s): HGBA1C in the last 72 hours. CBG: No results for input(s): GLUCAP in the last 168 hours. Lipid Profile: No results for input(s): CHOL, HDL, LDLCALC, TRIG, CHOLHDL, LDLDIRECT in the last 72 hours. Thyroid Function Tests: No results for input(s): TSH, T4TOTAL, FREET4, T3FREE, THYROIDAB in the last 72 hours. Anemia Panel: No results for input(s): VITAMINB12, FOLATE, FERRITIN, TIBC, IRON, RETICCTPCT in the last 72 hours. Urine analysis:    Component Value Date/Time   COLORURINE YELLOW 01/16/2017 0942   APPEARANCEUR CLEAR 01/16/2017 0942   LABSPEC 1.019 01/16/2017 0942   PHURINE 6.0 01/16/2017 0942   GLUCOSEU NEGATIVE 01/16/2017 0942   HGBUR NEGATIVE 01/16/2017 0942   BILIRUBINUR NEGATIVE 01/16/2017 0942   KETONESUR NEGATIVE 01/16/2017 0942   PROTEINUR NEGATIVE 01/16/2017 0942   NITRITE NEGATIVE 01/16/2017 0942   LEUKOCYTESUR NEGATIVE 01/16/2017 0942   Sepsis Labs: @LABRCNTIP (procalcitonin:4,lacticidven:4) )No  results found for this or any previous visit (from the past 240 hour(s)).       Radiology Studies: No results found.    Scheduled Meds: . ciprofloxacin  2 drop Left Eye Q4H while awake  . feeding supplement (ENSURE ENLIVE)  237 mL Oral TID BM  . folic acid  1 mg Oral Daily  . multivitamin with minerals  1 tablet Oral Daily  . thiamine  100 mg Oral Daily   Continuous Infusions: . sodium chloride 20 mL/hr at 11/12/18 1812     LOS: 8 days      Debbe Odea, MD [facility] Pager: [email or web address] Password [facility] 11/13/2018, 3:46 PM

## 2018-11-13 NOTE — Progress Notes (Signed)
Subjective: No reports of any bloody bowel movements since 2 PM yesterday.  Objective: Vital signs in last 24 hours: Temp:  [98 F (36.7 C)-98.6 F (37 C)] 98 F (36.7 C) (01/10 0526) Pulse Rate:  [59-67] 59 (01/10 0526) BP: (112-138)/(74-95) 131/87 (01/10 0526) SpO2:  [99 %-100 %] 99 % (01/10 0526) Last BM Date: 11/11/18  Intake/Output from previous day: No intake/output data recorded. Intake/Output this shift: No intake/output data recorded.  General appearance: alert and no distress GI: epigastric tenderness  Lab Results: Recent Labs    11/11/18 0412 11/12/18 0419  WBC 3.7* 4.0  HGB 8.5* 9.1*  HCT 26.8* 27.9*  PLT 213 249   BMET Recent Labs    11/11/18 0412 11/12/18 0419  NA 137 137  K 3.5 3.9  CL 104 103  CO2 28 27  GLUCOSE 88 96  BUN 5* 7  CREATININE 0.81 0.83  CALCIUM 8.1* 8.1*   LFT Recent Labs    11/11/18 0412  PROT 4.9*  ALBUMIN 2.2*  AST 15  ALT 13  ALKPHOS 66  BILITOT 1.6*   PT/INR No results for input(s): LABPROT, INR in the last 72 hours. Hepatitis Panel No results for input(s): HEPBSAG, HCVAB, HEPAIGM, HEPBIGM in the last 72 hours. C-Diff No results for input(s): CDIFFTOX in the last 72 hours. Fecal Lactopherrin No results for input(s): FECLLACTOFRN in the last 72 hours.  Studies/Results: No results found.  Medications:  Scheduled: . feeding supplement (ENSURE ENLIVE)  237 mL Oral TID BM  . folic acid  1 mg Oral Daily  . multivitamin with minerals  1 tablet Oral Daily  . pantoprazole (PROTONIX) IV  40 mg Intravenous Q12H  . thiamine  100 mg Oral Daily   Continuous: . sodium chloride 20 mL/hr at 11/12/18 1812    Assessment/Plan: 1) Pseudoaneruysm of the GDA. 2) ? Pancreatic mass or benign inflammatory cyst. 3) Anemia.   His blood work for this AM is pending, but it is reassuring that he denies any further bloody bowel movements.  He reports that the bowel movement frequency has declined.  Plan: 1) Continue with  supportive care. 2) He he continues to bleed and/or requires further transfusions, a bleeding scan will be needed. 3) Stop pantoprazole.  LOS: 8 days   Shaquisha Wynn D 11/13/2018, 6:34 AM

## 2018-11-14 DIAGNOSIS — E440 Moderate protein-calorie malnutrition: Secondary | ICD-10-CM

## 2018-11-14 LAB — CBC
HCT: 28.6 % — ABNORMAL LOW (ref 39.0–52.0)
HEMOGLOBIN: 8.9 g/dL — AB (ref 13.0–17.0)
MCH: 25.1 pg — ABNORMAL LOW (ref 26.0–34.0)
MCHC: 31.1 g/dL (ref 30.0–36.0)
MCV: 80.6 fL (ref 80.0–100.0)
Platelets: 278 10*3/uL (ref 150–400)
RBC: 3.55 MIL/uL — ABNORMAL LOW (ref 4.22–5.81)
RDW: 18.1 % — ABNORMAL HIGH (ref 11.5–15.5)
WBC: 5.6 10*3/uL (ref 4.0–10.5)
nRBC: 0 % (ref 0.0–0.2)

## 2018-11-14 MED ORDER — ONDANSETRON HCL 4 MG PO TABS: 4.0000 mg | ORAL_TABLET | Freq: Four times a day (QID) | ORAL | 0 refills | Status: DC | PRN

## 2018-11-14 MED ORDER — CIPROFLOXACIN HCL 0.3 % OP SOLN: 2.0000 [drp] | OPHTHALMIC | 0 refills | Status: DC

## 2018-11-14 NOTE — Care Management (Signed)
Spoke w patient at bedside. He goes to [facility] and they provide his Medications. Per Dr Wynelle Cleveland, eye drops will be needed until Monday and he can take home his vial from med drawer he has been using. He was provided with Good Rx coupon and Rx for zofran by CM.

## 2018-11-14 NOTE — Discharge Summary (Signed)
Physician Discharge Summary  Joe [name] QMG:[id number] DOB: 1961/08/23 DOA: 11/05/2018  PCP: Mack Hook, MD  Admit date: 11/05/2018 Discharge date: 11/14/2018  Admitted From: home Disposition:  home   Recommendations for Outpatient Follow-up:  1. GI f/u of pancreatic mass    Discharge Condition:  stable   CODE STATUS:  Full code   Diet recommendation:  Heart healthy Consultations:  GI  IR    Discharge Diagnoses:  Principal Problem:   GI bleed Active Problems:   Pancreatic mass   Hypertension   Anemia   Malnutrition of moderate degree  Acute conjunctivitis- left eye     Brief Summary: Joe [name] 58 year old with history only of hypertension came into the hospital on 1/2 with epigastric pain for the last 3 days along with bloody emesis and melena, no prior history of GI bleed in the past. He reports drinking 1-2 beers a day and taking Aleve over the last few days due to abdominal pain. His abdominal pain radiates into the back. He was found to be anemic in the ED with a hemoglobin of 7 down from 11.9, gastroenterology consulted, extensive GI work-up including EGD, colonoscopy and video capsule failed to demonstrate source of bleeding, also EUS fails to demonstrate any significant no abnormalities of the common bile duct apart from noted significant ductal dilatation,subsequently patient had abdominal MRI and CTs showing pseudoaneurysm,on 11/10/2018 had Mesenteric angiogramwithCoil embolizationof pseudoaneurysm arising from the [place] [facility] [place] [facility] Course:  Principal Problem:   GI bleed  - with maroon stools - status post EGD1/3/20 which did not show any clear evidence of a source for bleed. 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 - Abdominal ultrasound>    pancreatic or peripancreatic pseudoaneurysm as well as a 4.2 cm solid and cystic mass projecting at the pancreatic head 4.2 cm solid and cystic mass projecting at the pancreatic head. 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 - GI recommends repeat imaging in 2-3 months    Hypertension - Losartan/HCTZ  on hold  SVT -1 episode on 11/11/18      Left eye conjunctivitis - Cipro ophthalmic solution started    Discharge Exam: Vitals:   11/13/18 2150 11/14/18 0435  BP: 117/88 130/81  Pulse: 63 (!) 56  Resp: 16 18  Temp: 97.8 F (36.6 C) 98.1 F (36.7 C)  SpO2: 100% 100%   Vitals:   11/13/18 0526 11/13/18 1354 11/13/18 2150 11/14/18 0435  BP: 131/87 113/82 117/88 130/81  Pulse: (!) 59 62 63 (!) 56  Resp:  18 16 18   Temp: 98 F (36.7 C) 98 F (36.7 C) 97.8 F (36.6 C) 98.1 F (36.7 C)  TempSrc: Oral  Oral Oral  SpO2: 99% 100% 100% 100%  Weight:      Height:        General: Pt is alert, awake, not in acute distress Cardiovascular: RRR, S1/S2 +, no rubs, no gallops Respiratory: CTA bilaterally, no wheezing, no rhonchi Abdominal: Soft, mildly tender in mid abdomen still, non distended, bowel sounds + Extremities: no edema, no cyanosis   Discharge Instructions  Discharge Instructions    Diet - low sodium heart healthy   Complete by:  As directed    Increase activity  slowly   Complete by:  As directed      Allergies as of 11/14/2018      Reactions   Hctz [hydrochlorothiazide] Rash      Medication List    TAKE these medications   ciprofloxacin 0.3 % ophthalmic solution Commonly known as:  CILOXAN Place 2 drops into the left eye every 4 (four) hours while awake. Administer 1 drop, every 2 hours, while awake, for 2 days. Then 1 drop, every 4 hours, while awake, for the next 5 days.   cyclobenzaprine 10 MG tablet Commonly known as:  FLEXERIL 1/2 to 1 tab by mouth every 8 hours as needed for muscle spasm   losartan-hydrochlorothiazide 100-12.5 MG tablet Commonly known as:  HYZAAR Take 1 tablet by mouth daily.   ondansetron 4 MG tablet Commonly known as:  ZOFRAN Take 1 tablet (4 mg total) by mouth every 6 (six) hours as needed for nausea.      Follow-up [facility] Follow up on 11/19/2018.   Why:  post hospital follow up scheduled for 11/19/2018 with Freeman Caldron PA @ 2:50 pm  Contact information: [place] [id number] [phone]       Carol Ada, MD Follow up.   Specialty:  Gastroenterology Contact information: [place], [place] [address] [phone]          Allergies  Allergen Reactions  . Hctz [Hydrochlorothiazide] Rash     Procedures/Studies:  EGD, Colonoscopy, EUS, Capsule endoscopy  Mesenteric angiogram  Mr Abdomen W Wo Contrast  Result Date: 11/09/2018 CLINICAL DATA:  Epigastric pain with bloody vomiting and melena for 3 days. History of hypertension and pancreatitis. 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 EXAM: MRI ABDOMEN WITHOUT AND WITH CONTRAST TECHNIQUE: Multiplanar multisequence MR imaging of the abdomen was performed both before and after the administration of intravenous contrast. CONTRAST:  7 cc Gadavist COMPARISON:  Abdominal ultrasound 11/05/2018. FINDINGS: Lower chest: Small bilateral pleural effusions  with dependent atelectasis at both lung bases. Hepatobiliary: No morphologic changes of cirrhosis or significant hepatic steatosis. There is no focal hepatic lesion or abnormal enhancement following contrast. No evidence of gallstones or gallbladder wall thickening. There is moderate extrahepatic biliary dilatation with the common hepatic duct measuring up to 17 mm in diameter. The duct tapers distally and appears compressed by the process in the pancreatic head. No evidence of choledocholithiasis. Pancreas: There is prominent dilatation of the main pancreatic duct in the pancreatic body and tail, measuring up to 13 mm in diameter. There is associated side branch dilatation as well. There is a large complex, predominately cystic mass involving the pancreatic head. This demonstrates heterogeneous T2 signal, but predominantly homogeneous T1 signal prior to contrast administration. Following contrast, there is a homogeneous enhancing component anteriorly which follows blood pool, most likely a pseudoaneurysm based on previous ultrasound. This measures 15 x 25 mm on image 67/19 and 17 mm on coronal  image 59/27. This likely arises from the gastric duodenal artery. The remainder of the mass is predominantly cystic, although does have thickened enhancing septations. Overall dimensions of the mass are approximately 7.9 x 7.1 x 5.8 cm. Spleen: Normal in size without focal abnormality. Adrenals/Urinary Tract: Both adrenal glands appear normal. The kidneys and ureters appear normal. No hydronephrosis. Stomach/Bowel: Mild diffuse bowel wall thickening, especially in the right colon, likely related to ascites. No significant bowel distension or focal abnormality. Vascular/Lymphatic: There are no enlarged abdominal lymph nodes. Probable aortic and branch vessel atherosclerosis. As above, suspected pseudoaneurysm arising from the gastric duodenal artery anterior to the complex cystic mass in the pancreas. There is extrinsic  compression of the portal vein, but no evidence of venous occlusion or thrombosis. Other: Generalized edema throughout the subcutaneous and mesenteric fat. Small amount of ascites. Musculoskeletal: No acute or significant osseous findings. Lumbar spondylosis and scoliosis noted. IMPRESSION: 1. 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 Complex neoplasm less likely. Further evaluation with pancreatic protocol CT to include arterial phase images recommended as soon as possible. This may help determine need for endovascular treatment of presumed pseudoaneurysm. 2. 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 3. Anasarca with generalized soft tissue edema, ascites and bilateral pleural effusions. Electronically Signed   By: Richardean Sale M.D.   On: 11/09/2018 20:20   US Abdomen Complete  Result Date: 11/05/2018 CLINICAL DATA:  Pancreatitis. EXAM: ABDOMEN ULTRASOUND COMPLETE COMPARISON:  None. FINDINGS: Gallbladder: No gallstones or wall thickening visualized. No sonographic Murphy sign noted by sonographer. Common bile duct: Diameter: Dilated to 12-13 mm maximum. No visualized duct stone. Liver: Increased echogenicity with a coarsened overall heterogeneous echotexture. No discrete mass focal lesion. Portal vein is patent on color Doppler imaging with normal direction of blood flow towards the liver. IVC: No abnormality visualized. 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 Mass measures 4.2 x 3.2 x 3.5 cm. Spleen: Size and appearance within normal limits. Right Kidney: Length: 10.8 cm. Normal parenchymal echogenicity. No mass, stone or hydronephrosis. Trace perinephric fluid. Left Kidney: Length: 11.4 cm.  Normal parenchymal echogenicity. No mass, stone or hydronephrosis. Trace perinephric fluid. Abdominal aorta: No aneurysm visualized. Other findings: None. IMPRESSION: 1. Apparent pancreatic or peripancreatic pseudoaneurysm as well as a 4.2 cm solid and cystic mass projecting at the pancreatic head. This may be an inflammatory mass given the reported history of pancreatitis. Follow-up of these findings is recommended with pancreatic MRI with and without contrast, or pancreatic protocol CT if the patient cannot tolerate MRI. 2. Appearance of the liver is consistent with hepatic steatosis. Consider a component of cirrhosis given the coarsened, heterogeneous echotexture. No liver mass or focal lesion. 3. No other acute abnormality or significant finding. Electronically Signed   By: Lajean Manes M.D.   On: 11/05/2018 15:09   Ir Angiogram Visceral Selective  Result Date: 11/11/2018 INDICATION: 57 year old male with a history abdominal pain, hematemesis, melena, with cross-sectional imaging demonstrating pseudoaneurysm of the GDA, uncertain etiology. He presents for urgent angiogram and coil embolization. EXAM: ULTRASOUND GUIDED ACCESS RIGHT COMMON FEMORAL ARTERY MESENTERIC ANGIOGRAM COIL EMBOLIZATION OF GASTRODUODENAL ARTERY INCLUDING PSEUDOANEURYSM ARISING FROM THE GDA. CLOSURE OF RIGHT COMMON FEMORAL ARTERY WITH ANGIO-SEAL. MEDICATIONS: NONE ANESTHESIA/SEDATION: Moderate (conscious) sedation was employed during this procedure. A  total of Versed 2.0 mg and Fentanyl 100 mcg was administered intravenously. Moderate Sedation Time: 56 minutes. The patient's level of consciousness and vital signs were monitored continuously by radiology nursing throughout the procedure under my direct supervision. CONTRAST:  80 cc FLUOROSCOPY TIME:  Fluoroscopy Time: 11 minutes 12 seconds (544 mGy). COMPLICATIONS: None PROCEDURE: Informed consent was obtained from the patient following explanation of the procedure, risks, benefits and  alternatives. The patient understands, agrees and consents for the procedure. All questions were addressed. A time out was performed prior to the initiation of the procedure. Maximal barrier sterile technique utilized including caps, mask, sterile gowns, sterile gloves, large sterile drape, hand hygiene, and Betadine prep. Ultrasound survey of the right inguinal region was performed with images stored and sent to PACs, confirming patency of the vessel. 1% lidocaine was used for local anesthesia. Stab incision was made with 11 blade scalpel and blunt dissection was performed with ultrasound guidance. A micropuncture needle was then used access the right common femoral artery under ultrasound. With excellent arterial blood flow returned, and an .018 micro wire was passed through the needle, observed enter the abdominal aorta under fluoroscopy. The needle was removed, and a micropuncture sheath was placed over the wire. The inner dilator and wire were removed, and an 035 Bentson wire was advanced under fluoroscopy into the abdominal aorta. The sheath was removed and a standard 5 [place] vascular sheath was placed. The dilator was removed and the sheath was flushed. Cobra catheter was use elect the celiac artery access. Angiogram was performed. Glidewire was used to navigate the base catheter into the common hepatic artery. Angiogram was performed. A penumbra lantern 025 lumen microcatheter, straight tip, was then navigated with 014 fathom wire into the GDA origin and beyond the site of pseudoaneurysm into the gastroepiploic artery. Angiogram performed, confirming position. Coil embolization was then performed of the gastroepiploic artery beyond the pseudoaneurysm origin then withdrawing the microcatheter into the pseudoaneurysm. Catheter was carefully position within the central aspect of the filling pseudoaneurysm, and further coil embolization was performed with framing coils 20 mm x 60 mm both soft and standard.  Microcatheter was then further withdrawn into the abnormal gastroduodenal artery with coil embolization performed with a combination of 3 mm and 4 mm standard/soft Ruby coils. Final angiogram was performed of the common hepatic artery. Angiogram was then performed of the superior mesenteric artery through the base catheter, with no residual contributing flow identified. Angio-Seal was then deployed at the right common femoral artery. Patient tolerated the procedure well and remained hemodynamically stable throughout. No complications were encountered and no significant blood loss. 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 Status post Angio-Seal for hemostasis. Signed, Dulcy Fanny. Dellia Nims, RPVI [facility] Radiology Electronically Signed   By: Corrie Mckusick D.O.   On: 11/11/2018 08:31   Ir Angiogram Visceral Selective  Result Date: 11/11/2018 INDICATION: 58 year old male with a history abdominal pain, hematemesis, melena, with cross-sectional imaging demonstrating pseudoaneurysm of the GDA, uncertain etiology. He presents for urgent angiogram and coil embolization. EXAM: ULTRASOUND GUIDED ACCESS RIGHT COMMON FEMORAL ARTERY MESENTERIC ANGIOGRAM COIL EMBOLIZATION OF GASTRODUODENAL ARTERY INCLUDING PSEUDOANEURYSM ARISING FROM THE GDA. CLOSURE OF RIGHT COMMON FEMORAL ARTERY WITH ANGIO-SEAL. MEDICATIONS: NONE ANESTHESIA/SEDATION: Moderate (conscious) sedation was employed during this  procedure. A total of Versed 2.0 mg and Fentanyl 100 mcg was administered intravenously. Moderate Sedation Time: 56 minutes. The patient's level of consciousness and vital signs were monitored continuously by radiology nursing throughout the  procedure under my direct supervision. CONTRAST:  80 cc FLUOROSCOPY TIME:  Fluoroscopy Time: 11 minutes 12 seconds (544 mGy). COMPLICATIONS: None PROCEDURE: Informed consent was obtained from the patient following explanation of the procedure, risks, benefits and alternatives. The patient understands, agrees and consents for the procedure. All questions were addressed. A time out was performed prior to the initiation of the procedure. Maximal barrier sterile technique utilized including caps, mask, sterile gowns, sterile gloves, large sterile drape, hand hygiene, and Betadine prep. Ultrasound survey of the right inguinal region was performed with images stored and sent to PACs, confirming patency of the vessel. 1% lidocaine was used for local anesthesia. Stab incision was made with 11 blade scalpel and blunt dissection was performed with ultrasound guidance. A micropuncture needle was then used access the right common femoral artery under ultrasound. With excellent arterial blood flow returned, and an .018 micro wire was passed through the needle, observed enter the abdominal aorta under fluoroscopy. The needle was removed, and a micropuncture sheath was placed over the wire. The inner dilator and wire were removed, and an 035 Bentson wire was advanced under fluoroscopy into the abdominal aorta. The sheath was removed and a standard 5 [place] vascular sheath was placed. The dilator was removed and the sheath was flushed. Cobra catheter was use elect the celiac artery access. Angiogram was performed. Glidewire was used to navigate the base catheter into the common hepatic artery. Angiogram was performed. A penumbra lantern 025 lumen microcatheter, straight tip, was then navigated with 014 fathom wire into the GDA origin and beyond the site of pseudoaneurysm into the gastroepiploic artery. Angiogram performed, confirming position. Coil embolization was then performed of the gastroepiploic artery beyond the  pseudoaneurysm origin then withdrawing the microcatheter into the pseudoaneurysm. Catheter was carefully position within the central aspect of the filling pseudoaneurysm, and further coil embolization was performed with framing coils 20 mm x 60 mm both soft and standard. Microcatheter was then further withdrawn into the abnormal gastroduodenal artery with coil embolization performed with a combination of 3 mm and 4 mm standard/soft Ruby coils. Final angiogram was performed of the common hepatic artery. Angiogram was then performed of the superior mesenteric artery through the base catheter, with no residual contributing flow identified. Angio-Seal was then deployed at the right common femoral artery. Patient tolerated the procedure well and remained hemodynamically stable throughout. No complications were encountered and no significant blood loss. 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 Status post Angio-Seal for hemostasis. Signed, Dulcy Fanny. Dellia Nims, RPVI [facility] Radiology Electronically Signed   By: Corrie Mckusick D.O.   On: 11/11/2018 08:31   Ir Angiogram Selective Each Additional Vessel  Result Date: 11/11/2018 INDICATION: 57 year old male with a history abdominal pain, hematemesis, melena, with cross-sectional imaging demonstrating pseudoaneurysm of the GDA, uncertain etiology. He presents for urgent angiogram and coil embolization. EXAM: ULTRASOUND GUIDED ACCESS RIGHT COMMON FEMORAL ARTERY MESENTERIC ANGIOGRAM COIL EMBOLIZATION OF GASTRODUODENAL ARTERY INCLUDING PSEUDOANEURYSM ARISING FROM THE GDA. CLOSURE OF RIGHT COMMON FEMORAL ARTERY WITH ANGIO-SEAL. MEDICATIONS: NONE ANESTHESIA/SEDATION: Moderate  (conscious)  sedation was employed during this procedure. A total of Versed 2.0 mg and Fentanyl 100 mcg was administered intravenously. Moderate Sedation Time: 56 minutes. The patient's level of consciousness and vital signs were monitored continuously by radiology nursing throughout the procedure under my direct supervision. CONTRAST:  80 cc FLUOROSCOPY TIME:  Fluoroscopy Time: 11 minutes 12 seconds (544 mGy). COMPLICATIONS: None PROCEDURE: Informed consent was obtained from the patient following explanation of the procedure, risks, benefits and alternatives. The patient understands, agrees and consents for the procedure. All questions were addressed. A time out was performed prior to the initiation of the procedure. Maximal barrier sterile technique utilized including caps, mask, sterile gowns, sterile gloves, large sterile drape, hand hygiene, and Betadine prep. Ultrasound survey of the right inguinal region was performed with images stored and sent to PACs, confirming patency of the vessel. 1% lidocaine was used for local anesthesia. Stab incision was made with 11 blade scalpel and blunt dissection was performed with ultrasound guidance. A micropuncture needle was then used access the right common femoral artery under ultrasound. With excellent arterial blood flow returned, and an .018 micro wire was passed through the needle, observed enter the abdominal aorta under fluoroscopy. The needle was removed, and a micropuncture sheath was placed over the wire. The inner dilator and wire were removed, and an 035 Bentson wire was advanced under fluoroscopy into the abdominal aorta. The sheath was removed and a standard 5 [place] vascular sheath was placed. The dilator was removed and the sheath was flushed. Cobra catheter was use elect the celiac artery access. Angiogram was performed. Glidewire was used to navigate the base catheter into the common hepatic artery. Angiogram was performed. A penumbra lantern 025 lumen  microcatheter, straight tip, was then navigated with 014 fathom wire into the GDA origin and beyond the site of pseudoaneurysm into the gastroepiploic artery. Angiogram performed, confirming position. Coil embolization was then performed of the gastroepiploic artery beyond the pseudoaneurysm origin then withdrawing the microcatheter into the pseudoaneurysm. Catheter was carefully position within the central aspect of the filling pseudoaneurysm, and further coil embolization was performed with framing coils 20 mm x 60 mm both soft and standard. Microcatheter was then further withdrawn into the abnormal gastroduodenal artery with coil embolization performed with a combination of 3 mm and 4 mm standard/soft Ruby coils. Final angiogram was performed of the common hepatic artery. Angiogram was then performed of the superior mesenteric artery through the base catheter, with no residual contributing flow identified. Angio-Seal was then deployed at the right common femoral artery. Patient tolerated the procedure well and remained hemodynamically stable throughout. No complications were encountered and no significant blood loss. 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 Status post Angio-Seal for hemostasis. Signed, Dulcy Fanny. Dellia Nims, RPVI [facility] Radiology Electronically Signed   By: Corrie Mckusick D.O.   On: 11/11/2018 08:31   Ir US Guide Vasc Access Right  Result Date: 11/11/2018 INDICATION: 58 year old male with a history abdominal pain, hematemesis, melena, with cross-sectional imaging demonstrating pseudoaneurysm of the GDA, uncertain etiology. He presents for urgent angiogram and coil embolization. EXAM: ULTRASOUND  GUIDED ACCESS RIGHT COMMON FEMORAL ARTERY MESENTERIC ANGIOGRAM COIL EMBOLIZATION OF GASTRODUODENAL ARTERY INCLUDING PSEUDOANEURYSM ARISING FROM THE GDA. CLOSURE OF RIGHT COMMON FEMORAL ARTERY WITH ANGIO-SEAL. MEDICATIONS:  NONE ANESTHESIA/SEDATION: Moderate (conscious) sedation was employed during this procedure. A total of Versed 2.0 mg and Fentanyl 100 mcg was administered intravenously. Moderate Sedation Time: 56 minutes. The patient's level of consciousness and vital signs were monitored continuously by radiology nursing throughout the procedure under my direct supervision. CONTRAST:  80 cc FLUOROSCOPY TIME:  Fluoroscopy Time: 11 minutes 12 seconds (544 mGy). COMPLICATIONS: None PROCEDURE: Informed consent was obtained from the patient following explanation of the procedure, risks, benefits and alternatives. The patient understands, agrees and consents for the procedure. All questions were addressed. A time out was performed prior to the initiation of the procedure. Maximal barrier sterile technique utilized including caps, mask, sterile gowns, sterile gloves, large sterile drape, hand hygiene, and Betadine prep. Ultrasound survey of the right inguinal region was performed with images stored and sent to PACs, confirming patency of the vessel. 1% lidocaine was used for local anesthesia. Stab incision was made with 11 blade scalpel and blunt dissection was performed with ultrasound guidance. A micropuncture needle was then used access the right common femoral artery under ultrasound. With excellent arterial blood flow returned, and an .018 micro wire was passed through the needle, observed enter the abdominal aorta under fluoroscopy. The needle was removed, and a micropuncture sheath was placed over the wire. The inner dilator and wire were removed, and an 035 Bentson wire was advanced under fluoroscopy into the abdominal aorta. The sheath was removed and a standard 5 [place] vascular sheath was placed. The dilator  was removed and the sheath was flushed. Cobra catheter was use elect the celiac artery access. Angiogram was performed. Glidewire was used to navigate the base catheter into the common hepatic artery. Angiogram was performed. A penumbra lantern 025 lumen microcatheter, straight tip, was then navigated with 014 fathom wire into the GDA origin and beyond the site of pseudoaneurysm into the gastroepiploic artery. Angiogram performed, confirming position. Coil embolization was then performed of the gastroepiploic artery beyond the pseudoaneurysm origin then withdrawing the microcatheter into the pseudoaneurysm. Catheter was carefully position within the central aspect of the filling pseudoaneurysm, and further coil embolization was performed with framing coils 20 mm x 60 mm both soft and standard. Microcatheter was then further withdrawn into the abnormal gastroduodenal artery with coil embolization performed with a combination of 3 mm and 4 mm standard/soft Ruby coils. Final angiogram was performed of the common hepatic artery. Angiogram was then performed of the superior mesenteric artery through the base catheter, with no residual contributing flow identified. Angio-Seal was then deployed at the right common femoral artery. Patient tolerated the procedure well and remained hemodynamically stable throughout. No complications were encountered and no significant blood loss. 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 Status post Angio-Seal for hemostasis. Signed, Dulcy Fanny. Dellia Nims, RPVI [facility] Electronically Signed   By: Corrie Mckusick D.O.   On: 11/11/2018 08:31   Ct Pancreas Abd W/wo  Result Date:  11/10/2018 CLINICAL DATA:  58 year old male with a history melena, prior hematemesis and pancreatitis. Prior MRI 11/09/2018 suggests complex mass of the pancreatic head as well as a pseudoaneurysm. EXAM: CT ABDOMEN WITHOUT AND WITH CONTRAST TECHNIQUE: Multidetector CT imaging of the abdomen was performed following the standard protocol before and following the bolus administration of intravenous  contrast. CONTRAST:  <See Chart> ISOVUE-370 IOPAMIDOL (ISOVUE-370) INJECTION 76% COMPARISON:  MRI 11/09/2018 FINDINGS: Lower chest: Atelectatic changes at the right lung base. Trace pleural effusions bilateral lower lungs. Hepatobiliary: Delayed CT images demonstrate uniform enhancement/attenuation of the liver parenchyma with the early phase imaging patchy attenuation likely related to flow phenomenon. There is trace intrahepatic ductal dilatation of both right and left-sided ducts. Gallbladder somewhat distended with no radiopaque stones within the gallbladder lumen. Trace pericholecystic fluid without evidence of gallbladder wall thickening. Ascites adjacent to the liver in the subdiaphragmatic space extends below the liver within the pericolic gutter. Ill-defined heterogeneous attenuation mass continuous with the pancreatic head extends towards the gastrohepatic ligament in the liver hilum. Questionable portal caval nodes, inseparable from the caudate (image 46 of series 9). Pancreas: CT demonstrates pancreatic ductal dilatation involving the tail the pancreas and body of the pancreas, with the dilatation cutting off at heterogeneous soft tissue mass involving the pancreatic head (image 7 of series 14). At this same level there is abrupt termination/cutoff of the common bile duct. (Image 7 of series 14). Heterogeneous soft tissue mass at this location represents a pseudoaneurysm measuring at least 6.8 cm in AP dimension on image 9 of series 14. There is near complete thrombosis of the pseudoaneurysm, with flow maintained  anteriorly. The mass in the head of the pancreas compresses the portal vein, just beyond the confluence with superior mesenteric vein to a thread of contrast on image 59 of series 9. More superiorly there is heterogeneous hypoattenuating bilobed mass just superior to the common hepatic artery measuring 6.4 cm by 2.8 cm. This mass appear to have cystic configuration on the prior MRI with internal septations. Ill-defined inflammatory changes surrounding the body and tail of the pancreas, with haziness of the adjacent fat. Spleen: Greatest dimension of spleen 13 cm on the axial images. Adrenals/Urinary Tract: Unremarkable appearance of the adrenal glands. No evidence of hydronephrosis of the right or left kidney. No nephrolithiasis. Unremarkable course of the bilateral ureters. Unremarkable appearance of the urinary bladder. Stomach/Bowel: Unremarkable appearance of stomach. Inflammatory changes of the upper abdomen are continuous with the proximal duodenum. No evidence of gastric outlet obstruction. Small bowel is decompressed with no focal wall thickening. Normal appendix. The cecum is unremarkable without significant distension. Inflammatory changes of the upper abdomen are continuous with the hepatic flexure. Colonic diverticular present of the transverse colon, splenic flexure, sigmoid colon. Vascular/Lymphatic: No significant atherosclerotic changes. Small lymph nodes in the retroperitoneum. Low-density ascites in the dependent pelvis as well as adjacent to the liver. Portal vein is compressed just beyond the superior mesenteric vein confluence. No portal vein thrombus is identified. There is significant collateral formation of portal to portal collaterals via the gastroepiploic vasculature and at the hilum of the liver. Unremarkable appearance of the iliac veins. Other: Trace abdominal wall edema. e. Musculoskeletal: No acute displaced fracture. 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 This termination of the pancreatic duct occurs at a similar termination of the common bile duct, both are which are compressed/obstructed by heterogeneously attenuating/enhancing mass. The current CT resolution does not differentiate whether the mass effect is from the partially thrombosed pseudoaneurysm, or if there is a superimposed primary pancreatic mass at the pancreatic head. Preliminary results were discussed by telephone at the time of interpretation on 11/10/2018 at about 3 p.m. with Dr. Saralyn Pilar  HUNG , who verbally acknowledged these results. 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 Repeat cross-sectional imaging with CT or MRI may be useful once the pseudoaneurysm has been treated in order to increase specificity of findings in the pancreatic head, as neoplasm is not excluded. 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 Questionable pathologic adenopathy in the portacaval nodal stations and liver hilum, as well as adjacent to the second portion of the duodenum, increasing the suspicion of occult malignancy. Trace ascites. Mild body wall edema/anasarca with bilateral pleural effusions. Electronically Signed   By: Corrie Mckusick D.O.   On: 11/10/2018 16:19   [employer]  Result Date: 11/11/2018 INDICATION: 58 year old male with a history abdominal pain, hematemesis, melena, with cross-sectional imaging demonstrating pseudoaneurysm of the GDA, uncertain etiology. He presents for urgent angiogram and coil  embolization. EXAM: ULTRASOUND GUIDED ACCESS RIGHT COMMON FEMORAL ARTERY MESENTERIC ANGIOGRAM COIL EMBOLIZATION OF GASTRODUODENAL ARTERY INCLUDING PSEUDOANEURYSM ARISING FROM THE GDA. CLOSURE OF RIGHT COMMON FEMORAL ARTERY WITH ANGIO-SEAL. MEDICATIONS: NONE ANESTHESIA/SEDATION: Moderate (conscious) sedation was employed during this procedure. A total of Versed 2.0 mg and Fentanyl 100 mcg was administered intravenously. Moderate Sedation Time: 56 minutes. The patient's level of consciousness and vital signs were monitored continuously by radiology nursing throughout the procedure under my direct supervision. CONTRAST:  80 cc FLUOROSCOPY TIME:  Fluoroscopy Time: 11 minutes 12 seconds (544 mGy). COMPLICATIONS: None PROCEDURE: Informed consent was obtained from the patient following explanation of the procedure, risks, benefits and alternatives. The patient understands, agrees and consents for the procedure. All questions were addressed. A time out was performed prior to the initiation of the procedure. Maximal barrier sterile technique utilized including caps, mask, sterile gowns, sterile gloves, large sterile drape, hand hygiene, and Betadine prep. Ultrasound survey of the right inguinal region was performed with images stored and sent to PACs, confirming patency of the vessel. 1% lidocaine was used for local anesthesia. Stab incision was made with 11 blade scalpel and blunt dissection was performed with ultrasound guidance. A micropuncture needle was then used access the right common femoral artery under ultrasound. With excellent arterial blood flow returned, and an .018 micro wire was passed through the needle, observed enter the abdominal aorta under fluoroscopy. The needle was removed, and a micropuncture sheath was placed over the wire. The inner dilator and wire were removed, and an 035 Bentson wire was advanced under fluoroscopy into the abdominal aorta. The sheath was removed and a standard 5 [place] vascular  sheath was placed. The dilator was removed and the sheath was flushed. Cobra catheter was use elect the celiac artery access. Angiogram was performed. Glidewire was used to navigate the base catheter into the common hepatic artery. Angiogram was performed. A penumbra lantern 025 lumen microcatheter, straight tip, was then navigated with 014 fathom wire into the GDA origin and beyond the site of pseudoaneurysm into the gastroepiploic artery. Angiogram performed, confirming position. Coil embolization was then performed of the gastroepiploic artery beyond the pseudoaneurysm origin then withdrawing the microcatheter into the pseudoaneurysm. Catheter was carefully position within the central aspect of the filling pseudoaneurysm, and further coil embolization was performed with framing coils 20 mm x 60 mm both soft and standard. Microcatheter was then further withdrawn into  the abnormal gastroduodenal artery with coil embolization performed with a combination of 3 mm and 4 mm standard/soft Ruby coils. Final angiogram was performed of the common hepatic artery. Angiogram was then performed of the superior mesenteric artery through the base catheter, with no residual contributing flow identified. Angio-Seal was then deployed at the right common femoral artery. Patient tolerated the procedure well and remained hemodynamically stable throughout. No complications were encountered and no significant blood loss. 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 Status post Angio-Seal for hemostasis. Signed, Dulcy Fanny. Dellia Nims, RPVI [facility] Radiology Electronically Signed   By: Corrie Mckusick D.O.   On: 11/11/2018 08:31     The results  of significant diagnostics from this hospitalization (including imaging, microbiology, ancillary and laboratory) are listed below for reference.     Microbiology: No results found for this or any previous visit (from the past 240 hour(s)).   Labs: BNP (last 3 results) No results for input(s): BNP in the last 8760 hours. Basic Metabolic Panel: Recent Labs  Lab 11/09/18 0455 11/10/18 0517 11/11/18 0412 11/12/18 0419  NA 136 138 137 137  K 3.0* 3.8 3.5 3.9  CL 105 106 104 103  CO2 24 24 28 27   GLUCOSE 98 93 88 96  BUN 5* 5* 5* 7  CREATININE 0.82 0.86 0.81 0.83  CALCIUM 7.7* 8.0* 8.1* 8.1*   Liver Function Tests: Recent Labs  Lab 11/09/18 0455 11/10/18 0517 11/11/18 0412  AST 14* 14* 15  ALT 14 13 13   ALKPHOS 57 70 66  BILITOT 1.3* 1.2 1.6*  PROT 4.9* 4.6* 4.9*  ALBUMIN 2.3* 2.2* 2.2*   No results for input(s): LIPASE, AMYLASE in the last 168 hours. No results for input(s): AMMONIA in the last 168 hours. CBC: Recent Labs  Lab 11/10/18 0517 11/11/18 0412 11/12/18 0419 11/13/18 0901 11/14/18 0324  WBC 3.6* 3.7* 4.0 4.3 5.6  HGB 7.4* 8.5* 9.1* 8.8* 8.9*  HCT 22.9* 26.8* 27.9* 28.5* 28.6*  MCV 79.0* 79.3* 80.6 80.3 80.6  PLT 207 213 249 256 278   Cardiac Enzymes: No results for input(s): CKTOTAL, CKMB, CKMBINDEX, TROPONINI in the last 168 hours. BNP: Invalid input(s): POCBNP CBG: No results for input(s): GLUCAP in the last 168 hours. D-Dimer No results for input(s): DDIMER in the last 72 hours. Hgb A1c No results for input(s): HGBA1C in the last 72 hours. Lipid Profile No results for input(s): CHOL, HDL, LDLCALC, TRIG, CHOLHDL, LDLDIRECT in the last 72 hours. Thyroid function studies No results for input(s): TSH, T4TOTAL, T3FREE, THYROIDAB in the last 72 hours.  Invalid input(s): FREET3 Anemia work up No results for input(s): VITAMINB12, FOLATE, FERRITIN, TIBC, IRON, RETICCTPCT in the last 72 hours. Urinalysis    Component Value Date/Time    COLORURINE YELLOW 01/16/2017 0942   APPEARANCEUR CLEAR 01/16/2017 0942   LABSPEC 1.019 01/16/2017 0942   PHURINE 6.0 01/16/2017 0942   GLUCOSEU NEGATIVE 01/16/2017 0942   HGBUR NEGATIVE 01/16/2017 0942   BILIRUBINUR NEGATIVE 01/16/2017 0942   KETONESUR NEGATIVE 01/16/2017 0942   PROTEINUR NEGATIVE 01/16/2017 0942   NITRITE NEGATIVE 01/16/2017 0942   LEUKOCYTESUR NEGATIVE 01/16/2017 0942   Sepsis Labs Invalid input(s): PROCALCITONIN,  WBC,  LACTICIDVEN Microbiology No results found for this or any previous visit (from the past  240 hour(s)).   Time coordinating discharge in minutes: 60  SIGNED:   Debbe Odea, MD  [facility] 11/14/2018, 1:23 PM Pager   If 7PM-7AM, please contact night-coverage [email or web address] Password [email or web address]

## 2018-11-14 NOTE — Plan of Care (Signed)
  Problem: Education: Goal: Knowledge of General Education information will improve Description Including pain rating scale, medication(s)/side effects and non-pharmacologic comfort measures Outcome: Progressing   Problem: Health Behavior/Discharge Planning: Goal: Ability to manage health-related needs will improve Outcome: Progressing   Problem: Clinical Measurements: Goal: Ability to maintain clinical measurements within normal limits will improve Outcome: Progressing Goal: Will remain free from infection Outcome: Progressing Goal: Diagnostic test results will improve Outcome: Progressing Goal: Respiratory complications will improve Outcome: Progressing Goal: Cardiovascular complication will be avoided Outcome: Progressing   Problem: Activity: Goal: Risk for activity intolerance will decrease Outcome: Progressing   Problem: Nutrition: Goal: Adequate nutrition will be maintained Outcome: Progressing   Problem: Coping: Goal: Level of anxiety will decrease Outcome: Progressing   Problem: Elimination: Goal: Will not experience complications related to bowel motility Outcome: Progressing Goal: Will not experience complications related to urinary retention Outcome: Progressing   Problem: Pain Managment: Goal: General experience of comfort will improve Outcome: Progressing   Problem: Safety: Goal: Ability to remain free from injury will improve Outcome: Progressing   Problem: Skin Integrity: Goal: Risk for impaired skin integrity will decrease Outcome: Progressing   Problem: Education: Goal: Ability to identify signs and symptoms of gastrointestinal bleeding will improve Outcome: Progressing   Problem: Bowel/Gastric: Goal: Will show no signs and symptoms of gastrointestinal bleeding Outcome: Progressing   Problem: Fluid Volume: Goal: Will show no signs and symptoms of excessive bleeding Outcome: Progressing   Problem: Fluid Volume: Goal: Will show no signs  and symptoms of excessive bleeding Outcome: Progressing   Problem: Clinical Measurements: Goal: Complications related to the disease process, condition or treatment will be avoided or minimized Outcome: Progressing

## 2018-11-14 NOTE — Discharge Instructions (Signed)
Abdominal Pain, Adult Abdominal pain can be caused by many things. Often, abdominal pain is not serious and it gets better with no treatment or by being treated at home. However, sometimes abdominal pain is serious. Your health care provider will do a medical history and a physical exam to try to determine the cause of your abdominal pain. Follow these instructions at home:  Take over-the-counter and prescription medicines only as told by your health care provider. Do not take a laxative unless told by your health care provider.  Drink enough fluid to keep your urine clear or pale yellow.  Watch your condition for any changes.  Keep all follow-up visits as told by your health care provider. This is important. Contact a health care provider if:  Your abdominal pain changes or gets worse.  You are not hungry or you lose weight without trying.  You are constipated or have diarrhea for more than 2-3 days.  You have pain when you urinate or have a bowel movement.  Your abdominal pain wakes you up at night.  Your pain gets worse with meals, after eating, or with certain foods.  You are throwing up and cannot keep anything down.  You have a fever. Get help right away if:  Your pain does not go away as soon as your health care provider told you to expect.  You cannot stop throwing up.  Your pain is only in areas of the abdomen, such as the right side or the left lower portion of the abdomen.  You have bloody or black stools, or stools that look like tar.  You have severe pain, cramping, or bloating in your abdomen.  You have signs of dehydration, such as: ? Dark urine, very little urine, or no urine. ? Cracked lips. ? Dry mouth. ? Sunken eyes. ? Sleepiness. ? Weakness. This information is not intended to replace advice given to you by your health care provider. Make sure you discuss any questions you have with your health care provider. Document Released: 07/31/2005 Document  Revised: 05/10/2016 Document Reviewed: 04/03/2016 Elsevier Interactive Patient Education  2019 [place].   Abdominal Pain, Adult  Many things can cause belly (abdominal) pain. Most times, belly pain is not dangerous. Many cases of belly pain can be watched and treated at home. Sometimes belly pain is serious, though. Your doctor will try to find the cause of your belly pain. Follow these instructions at home:  Take over-the-counter and prescription medicines only as told by your doctor. Do not take medicines that help you poop (laxatives) unless told to by your doctor.  Drink enough fluid to keep your pee (urine) clear or pale yellow.  Watch your belly pain for any changes.  Keep all follow-up visits as told by your doctor. This is important. Contact a doctor if:  Your belly pain changes or gets worse.  You are not hungry, or you lose weight without trying.  You are having trouble pooping (constipated) or have watery poop (diarrhea) for more than 2-3 days.  You have pain when you pee or poop.  Your belly pain wakes you up at night.  Your pain gets worse with meals, after eating, or with certain foods.  You are throwing up and cannot keep anything down.  You have a fever. Get help right away if:  Your pain does not go away as soon as your doctor says it should.  You cannot stop throwing up.  Your pain is only in areas of your  belly, such as the right side or the left lower part of the belly.  You have bloody or black poop, or poop that looks like tar.  You have very bad pain, cramping, or bloating in your belly.  You have signs of not having enough fluid or water in your body (dehydration), such as: ? Dark pee, very little pee, or no pee. ? Cracked lips. ? Dry mouth. ? Sunken eyes. ? Sleepiness. ? Weakness. This information is not intended to replace advice given to you by your health care provider. Make sure you discuss any questions you have with your health  care provider. Document Released: 04/08/2008 Document Revised: 05/10/2016 Document Reviewed: 04/03/2016 Elsevier Interactive Patient Education  2019 [employer]   Acute Pain, Adult Acute pain is a type of pain that may last for just a few days or as long as six months. It is often related to an illness, injury, or medical procedure. Acute pain may be mild, moderate, or severe. It usually goes away once your injury has healed or you are no longer ill. Pain can make it hard for you to do daily activities. It can cause anxiety and lead to other problems if left untreated. Treatment depends on the cause and severity of your acute pain. Follow these instructions at home:  Check your pain level as told by your health care provider.  Take over-the-counter and prescription medicines only as told by your health care provider.  If you are taking prescription pain medicine: ? Ask your health care provider about taking a stool softener or laxative to prevent constipation. ? Do not stop taking the medicine suddenly. Talk to your health care provider about how and when to discontinue prescription pain medicine. ? If your pain is severe, do not take more pills than instructed by your health care provider. ? Do not take other over-the-counter pain medicines in addition to this medicine unless told by your health care provider. ? Do not drive or operate heavy machinery while taking prescription pain medicine.  Apply ice or heat as told by your health care provider. These may reduce swelling and pain.  Ask your health care provider if other strategies such as distraction, relaxation, or physical therapies can help your pain.  Keep all follow-up visits as told by your health care provider. This is important. Contact a health care provider if:  You have pain that is not controlled by medicine.  Your pain does not improve or gets worse.  You have side effects from pain medicines, such as vomitingor  confusion. Get help right away if:  You have severe pain.  You have trouble breathing.  You lose consciousness.  You have chest pain or pressure that lasts for more than a few minutes. Along with the chest pain you may: ? Have pain or discomfort in one or both arms, your back, neck, jaw, or stomach. ? Have shortness of breath. ? Break out in a cold sweat. ? Feel nauseous. ? Become light-headed. These symptoms may represent a serious problem that is an emergency. Do not wait to see if the symptoms will go away. Get medical help right away. Call your local emergency services (911 in the [place] Do not drive yourself to the hospital. This information is not intended to replace advice given to you by your health care provider. Make sure you discuss any questions you have with your health care provider. Document Released: 11/05/2015 Document Revised: 03/29/2016 Document Reviewed: 11/05/2015 Elsevier Interactive Patient Education  2019 [place].   Acute Pancreatitis  The pancreas is a gland that is located behind the stomach on the left side of the abdomen. It produces enzymes that help to digest food. The pancreas also releases the hormones glucagon and insulin, which help to regulate blood sugar. Acute pancreatitis happens when inflammation of the pancreas suddenly occurs and the pancreas becomes irritated and swollen. Most acute attacks last a few days and cause serious problems. Some people become dehydrated and develop low blood pressure. In severe cases, bleeding in the abdomen can lead to shock and can be life-threatening. The lungs, heart, and kidneys may fail. What are the causes? This condition may be caused by:  Alcohol abuse.  Drug abuse.  Gallstones or other conditions that can block the tube that drains the pancreas (pancreatic duct).  A tumor in the pancreas. Other causes include:  Certain medicines.  Exposure to certain chemicals.  Diabetes.  An infection in  the pancreas.  Damage caused by an accident (trauma).  The poison (venom) from a scorpion bite.  Abdominal surgery.  Autoimmune pancreatitis. This is when the body's disease-fighting (immune) system attacks the pancreas.  Genes that are passed from parent to child (inherited). In some cases, the cause of this condition is not known. What are the signs or symptoms? Symptoms of this condition include:  Pain in the upper abdomen that may radiate to the back. Pain may be severe.  Tenderness and swelling of the abdomen.  Nausea and vomiting.  Fever. How is this diagnosed? This condition may be diagnosed based on:  A physical exam.  Blood tests.  Imaging tests, such as X-rays, CT or MRI scans, or an ultrasound of the abdomen. How is this treated? Treatment for this condition usually requires a stay in the hospital. Treatment for this condition may include:  Pain medicine.  Fluid replacement through an IV.  Placing a tube in the stomach to remove stomach contents and to control vomiting (NG tube, or nasogastric tube).  Not eating for 3-4 days. This gives the pancreas a rest, because enzymes are not being produced that can cause further damage.  Antibiotic medicines, if your condition is caused by an infection.  Treating any underlying conditions that may be the cause.  Steroid medicines, if your condition is caused by your immune system attacking your body's own tissues (autoimmune disease).  Surgery on the pancreas or gallbladder. Follow these instructions at home: Eating and drinking   Follow instructions from your health care provider about diet. This may involve avoiding alcohol and decreasing the amount of fat in your diet.  Eat smaller, more frequent meals. This reduces the amount of digestive fluids that the pancreas produces.  Drink enough fluid to keep your urine pale yellow.  Do not drink alcohol if it caused your condition. General instructions  Take  over-the-counter and prescription medicines only as told by your health care provider.  Do not drive or use heavy machinery while taking prescription pain medicine.  Ask your health care provider if the medicine prescribed to you can cause constipation. You may need to take steps to prevent or treat constipation, such as: ? Take an over-the-counter or prescription medicine for constipation. ? Eat foods that are high in fiber such as whole grains and beans. ? Limit foods that are high in fat and processed sugars, such as fried or sweet foods.  Do not use any products that contain nicotine or tobacco, such as cigarettes, e-cigarettes, and chewing tobacco. If  you need help quitting, ask your health care provider.  Get plenty of rest.  If directed, check your blood sugar at home as told by your health care provider.  Keep all follow-up visits as told by your health care provider. This is important. Contact a health care provider if you:  Do not recover as quickly as expected.  Develop new or worsening symptoms.  Have persistent pain, weakness, or nausea.  Recover and then have another episode of pain.  Have a fever. Get help right away if:  You cannot eat or keep fluids down.  Your pain becomes severe.  Your skin or the white part of your eyes turns yellow (jaundice).  You have sudden swelling in your abdomen.  You vomit.  You feel dizzy or you faint.  Your blood sugar is high (over 300 mg/dL). Summary  Acute pancreatitis happens when inflammation of the pancreas suddenly occurs and the pancreas becomes irritated and swollen.  This condition is typically caused by alcohol abuse, drug abuse, or gallstones.  Treatment for this condition usually requires a stay in the hospital. This information is not intended to replace advice given to you by your health care provider. Make sure you discuss any questions you have with your health care provider. Document Released:  10/21/2005 Document Revised: 04/27/2018 Document Reviewed: 04/27/2018 Elsevier Interactive Patient Education  2019 [employer].

## 2018-11-14 NOTE — Progress Notes (Signed)
Discharge instructions (including medications) discussed with and copy provided to patient/caregiver 

## 2018-11-14 NOTE — Progress Notes (Addendum)
   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

## 2018-11-15 ENCOUNTER — Telehealth: Payer: Self-pay | Admitting: Internal Medicine

## 2018-11-15 MED ORDER — HYDROCODONE-ACETAMINOPHEN 5-325 MG PO TABS: 1.0000 | ORAL_TABLET | Freq: Four times a day (QID) | ORAL | 0 refills | Status: AC | PRN

## 2018-11-15 NOTE — Telephone Encounter (Signed)
[name] paged me - I am unable to reach her - get voice mail Also tried other # listed in demographics and got voicemail  Will try again later or wait for call back

## 2018-11-15 NOTE — Telephone Encounter (Signed)
Was able to speak to patient Having periumbilical pain like in hospital was Tx w/ narcotics in hospital # 15 Vicodin 5/325 rxed

## 2018-11-18 NOTE — Progress Notes (Signed)
Patient ID: Joe [name], male   DOB: [birth year], 58 y.o.   MRN: [id number]      Joe [name], is a 58 y.o. male  [id number]  [id number]  DOB - 02-Jun-1961  Subjective:  Chief Complaint and HPI: Joe [name] is a 58 y.o. male here today to establish care and for a follow up visit After hospitalization 1/2-1/09/2019 for GI bleed/pancreatic mass.  Weighed 167 10/2017.  Today is 154.6.  Appetite is up and down.  He has not scheduled f/up with Dr Benson Norway yet.  No melena/hematochezia.  No hematemesis.  Minimal abdominal pain.  Feeling better overall.  Energy coming back.  From discharge summary: Discharge Diagnoses:  Principal Problem:   GI bleed Active Problems:   Pancreatic mass   Hypertension   Anemia   Malnutrition of moderate degree  Acute conjunctivitis- left eye     Brief Summary: Joe S Womack4 year old with history only of hypertension came into the hospital on 1/2 with epigastric pain for the last 3 days along with bloody emesis and melena, no prior history of GI bleed in the past. He reports drinking 1-2 beers a day and taking Aleve over the last few days due to abdominal pain. His abdominal pain radiates into the back. He was found to be anemic in the ED with a hemoglobin of 7 down from 11.9, gastroenterology consulted, extensive GI work-up including EGD, colonoscopy and video capsule failed to demonstrate source of bleeding, also EUS fails to demonstrate any significant no abnormalities of the common bile duct apart from noted significant ductal dilatation,subsequently patient had abdominal MRI and CTs showing pseudoaneurysm,on 11/10/2018 had Mesenteric angiogramwithCoil embolizationof pseudoaneurysm arising from the [place] by [facility]:  Principal Problem: GI bleed  - with maroon stools - status post EGD1/3/20 which did not show any clear evidence of a source for bleed. 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 - Abdominal ultrasound>  pancreatic or peripancreatic pseudoaneurysm as well as a 4.2 cm solid and cystic mass projecting at the pancreatic head 4.2 cm solid and cystic mass projecting at the pancreatic head. 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 - GI recommends repeat imaging in 2-3 months  Hypertension - Losartan/HCTZ on hold  SVT -1 episode on 11/11/18   Left eye conjunctivitis - Cipro ophthalmic solution started   ED/Hospital notes reviewed.   Social History:  Has a gf/fiancee  ROS:   Constitutional:  No f/c, No night sweats EENT:  No vision changes, No blurry vision, No hearing changes. No mouth, throat, or ear problems.  Respiratory: No cough, No SOB Cardiac: No CP, no palpitations GI:  improving abd pain, No N/V/D. GU: No Urinary s/sx Musculoskeletal: No joint pain Neuro: No  headache, no dizziness, no motor weakness.  Skin: No rash Endocrine:  No polydipsia. No polyuria.  Psych: Denies SI/HI  No problems updated.  ALLERGIES: Allergies  Allergen Reactions  . Hctz [Hydrochlorothiazide] Rash    PAST MEDICAL HISTORY: Past Medical History:  Diagnosis Date  . Hypertension 2016     MEDICATIONS AT HOME: Prior to Admission medications   Medication Sig Start Date End Date Taking? Authorizing Provider  HYDROcodone-acetaminophen (NORCO/VICODIN) 5-325 MG tablet Take 1 tablet by mouth every 6 (six) hours as needed for up to 5 days for moderate pain. 11/15/18 11/20/18 Yes Gatha Mayer, MD  losartan-hydrochlorothiazide (HYZAAR) 100-12.5 MG tablet Take 1 tablet by mouth daily. 02/18/18  Yes Mack Hook, MD  ondansetron (ZOFRAN) 4 MG tablet Take 1 tablet (4 mg total) by mouth every 6 (six) hours as needed for nausea. 11/14/18  Yes Debbe Odea, MD     Objective:  EXAM:   Vitals:   11/19/18 1510  BP: 106/74  Pulse: 88  Resp: 18  Temp: 98.2 F (36.8 C)  TempSrc: Oral  SpO2: 98%  Weight: 154 lb (69.9 kg)  Height: 5\' 11"  (1.803 m)    General appearance : A&OX3. NAD. Non-toxic-appearing HEENT: Atraumatic and Normocephalic.  PERRLA. EOM intact.  Neck: supple, no JVD. No cervical lymphadenopathy. No thyromegaly Chest/Lungs:  Breathing-non-labored, Good air entry bilaterally, breath sounds normal without rales, rhonchi, or wheezing  CVS: S1 S2 regular, no murmurs, gallops, rubs  Abdomen: Bowel sounds present, mildly TTP RUQ(s/p stent placement)and not distended with no gaurding, rigidity or rebound. Extremities: Bilateral Lower Ext shows no edema, both legs are warm to touch with = pulse throughout Neurology:  CN II-XII grossly intact, Non focal.   Psych:  TP linear. J/I WNL. Normal speech. Appropriate eye contact and affect.  Skin:  No Rash  Data Review No results found for: HGBA1C   Assessment & Plan   1. Anemia, unspecified type - CBC with Differential/Platelet - Ambulatory referral to Gastroenterology  2. Essential hypertension Controlled-do not resume meds.  Check BP 3 times/week and record and bring to next visit.    3. Gastrointestinal hemorrhage, unspecified gastrointestinal hemorrhage type resolving - CBC with Differential/Platelet -  Ambulatory referral to Gastroenterology  4. Pancreatic mass Unclear if malignant - Ambulatory referral to Gastroenterology  5. Hospital discharge follow-up Much improved  Patient have been counseled extensively about nutrition and exercise  Return in about 1 month (around 12/20/2018) for assign PCP;  recheck anemia and other issues.  The patient was given clear instructions to go to ER or return to medical center if symptoms don't improve, worsen or new problems develop. The patient verbalized understanding. The patient was told to call to get lab results if they haven't heard anything in the next week.     Freeman Caldron, PA-C [facility] [place], [place]   11/19/2018, 3:16 PM

## 2018-11-19 ENCOUNTER — Ambulatory Visit: Payer: No Typology Code available for payment source | Attending: Family Medicine | Admitting: Physician Assistant

## 2018-11-19 VITALS — BP 106/74 | HR 88 | Temp 98.2°F | Resp 18 | Ht 71.0 in | Wt 154.0 lb

## 2018-11-19 DIAGNOSIS — K922 Gastrointestinal hemorrhage, unspecified: Secondary | ICD-10-CM

## 2018-11-19 DIAGNOSIS — K8689 Other specified diseases of pancreas: Secondary | ICD-10-CM

## 2018-11-19 DIAGNOSIS — D649 Anemia, unspecified: Secondary | ICD-10-CM | POA: Diagnosis not present

## 2018-11-19 DIAGNOSIS — Z09 Encounter for follow-up examination after completed treatment for conditions other than malignant neoplasm: Secondary | ICD-10-CM

## 2018-11-19 DIAGNOSIS — I10 Essential (primary) hypertension: Secondary | ICD-10-CM

## 2018-11-19 NOTE — Patient Instructions (Addendum)
DO NOT DRINK ALCOHOL.  DO NOT TAKE NSAIDS.   Drink ample water.  Avoid spicy/fatty foods.   Check blood pressure 3 times weekly and record and bring to next visit.

## 2018-11-20 ENCOUNTER — Telehealth: Payer: Self-pay | Admitting: Internal Medicine

## 2018-11-20 ENCOUNTER — Telehealth: Payer: Self-pay | Admitting: *Deleted

## 2018-11-20 LAB — CBC WITH DIFFERENTIAL/PLATELET
BASOS ABS: 0.1 10*3/uL (ref 0.0–0.2)
Basos: 1 %
EOS (ABSOLUTE): 0.4 10*3/uL (ref 0.0–0.4)
Eos: 6 %
Hematocrit: 33.2 % — ABNORMAL LOW (ref 37.5–51.0)
Hemoglobin: 10.5 g/dL — ABNORMAL LOW (ref 13.0–17.7)
Immature Grans (Abs): 0 10*3/uL (ref 0.0–0.1)
Immature Granulocytes: 0 %
Lymphocytes Absolute: 1.5 10*3/uL (ref 0.7–3.1)
Lymphs: 20 %
MCH: 24.6 pg — ABNORMAL LOW (ref 26.6–33.0)
MCHC: 31.6 g/dL (ref 31.5–35.7)
MCV: 78 fL — ABNORMAL LOW (ref 79–97)
Monocytes Absolute: 0.6 10*3/uL (ref 0.1–0.9)
Monocytes: 9 %
Neutrophils Absolute: 4.8 10*3/uL (ref 1.4–7.0)
Neutrophils: 64 %
Platelets: 672 10*3/uL — ABNORMAL HIGH (ref 150–450)
RBC: 4.26 x10E6/uL (ref 4.14–5.80)
RDW: 18.2 % — ABNORMAL HIGH (ref 11.6–15.4)
WBC: 7.5 10*3/uL (ref 3.4–10.8)

## 2018-11-20 NOTE — Telephone Encounter (Signed)
MA left a message with male person who answered. Voicemail states to give a call back to [place] with [facility] at [phone].

## 2018-11-20 NOTE — Telephone Encounter (Signed)
-----   Message from [place] Donovan, [place] sent at 11/20/2018  9:07 AM EST ----- Please call patient.  His anemia is stable/improving so it looks as though there has been no further blood loss.  Drink a lot of water and eat a lot of green vegetables.  Follow-up as planned.  Thanks, Freeman Caldron, PA-C

## 2018-11-20 NOTE — Telephone Encounter (Signed)
Patient called back to get their results please follow up.

## 2018-11-23 ENCOUNTER — Telehealth: Payer: Self-pay | Admitting: Internal Medicine

## 2018-11-23 NOTE — Telephone Encounter (Signed)
Pt came in to request a letter, he sated that  Gatha Mayer, MD was supposed to have him one ready at our facility. Please follow up with the letter and lab results

## 2018-11-23 NOTE — Telephone Encounter (Signed)
That is not an MD at our practice and patient should have been advised to contact that office for the letter. Patient is aware of labs.

## 2018-12-28 ENCOUNTER — Ambulatory Visit: Payer: Medicaid Other | Attending: Family Medicine | Admitting: Family Medicine

## 2018-12-28 ENCOUNTER — Encounter: Payer: Self-pay | Admitting: Family Medicine

## 2018-12-28 VITALS — BP 136/90 | HR 65 | Temp 97.7°F | Resp 18 | Ht 70.0 in | Wt 173.0 lb

## 2018-12-28 DIAGNOSIS — D500 Iron deficiency anemia secondary to blood loss (chronic): Secondary | ICD-10-CM

## 2018-12-28 DIAGNOSIS — Z79899 Other long term (current) drug therapy: Secondary | ICD-10-CM | POA: Diagnosis not present

## 2018-12-28 DIAGNOSIS — K8689 Other specified diseases of pancreas: Secondary | ICD-10-CM

## 2018-12-28 DIAGNOSIS — K869 Disease of pancreas, unspecified: Secondary | ICD-10-CM | POA: Insufficient documentation

## 2018-12-28 DIAGNOSIS — I10 Essential (primary) hypertension: Secondary | ICD-10-CM

## 2018-12-28 DIAGNOSIS — Z809 Family history of malignant neoplasm, unspecified: Secondary | ICD-10-CM | POA: Insufficient documentation

## 2018-12-28 DIAGNOSIS — E440 Moderate protein-calorie malnutrition: Secondary | ICD-10-CM

## 2018-12-28 DIAGNOSIS — Z882 Allergy status to sulfonamides status: Secondary | ICD-10-CM | POA: Insufficient documentation

## 2018-12-28 DIAGNOSIS — Z8249 Family history of ischemic heart disease and other diseases of the circulatory system: Secondary | ICD-10-CM | POA: Diagnosis not present

## 2018-12-28 NOTE — Progress Notes (Signed)
Subjective:    Patient ID: Joe [name], male    DOB: Jan 08, 1961, 58 y.o.   MRN: [id number]  HPI        58 yo male seen here by another provider on 11/19/2018 to establish care and patient was status post hospitalization for GI bleed/pancreatic mass in early January.  Patient reports that he has had improvement in abdominal pain and now just has a pressure sensation but only when he leans his abdomen against something or pulses hands across his abdomen.  Patient states that he would not really describe this as pressure.  Patient has had no additional nausea/vomiting or diarrhea.  Patient denies any blood in the stool and no dark stools.  Patient also feels that his appetite has improved.  Patient states that he is now able to eat and tolerate food on a regular basis throughout the day.  Patient reports that he does have an upcoming appointment with gastroenterology for further evaluation of his recent hospitalization due to suspected GI bleed and pancreatic mass.      Patient denies any headaches or dizziness related to his blood pressure.  Patient states that he was diagnosed with hypertension in the past and was on medication but stopped the medication after his blood pressure improved.  Patient reports that he was told at his office visit here on 11/19/2018 that he did not need to continue the use of his blood pressure medication that was prescribed at the time of his hospital discharge.  Patient reports that he feels much better than he did at the time of his hospitalization at the beginning of January.  Past Medical History:  Diagnosis Date  . Hypertension 2016   Past Surgical History:  Procedure Laterality Date  . COLONOSCOPY WITH PROPOFOL N/A 11/08/2018   Procedure: COLONOSCOPY WITH PROPOFOL;  Surgeon: Lavena Bullion, DO;  Location: [facility];  Service: Gastroenterology;  Laterality: N/A;  . ESOPHAGOGASTRODUODENOSCOPY (EGD) WITH PROPOFOL N/A 11/06/2018   Procedure:  ESOPHAGOGASTRODUODENOSCOPY (EGD) WITH PROPOFOL;  Surgeon: Carol Ada, MD;  Location: [facility];  Service: Endoscopy;  Laterality: N/A;  . EUS  11/06/2018   Procedure: FULL UPPER ENDOSCOPIC ULTRASOUND (EUS) RADIAL;  Surgeon: Carol Ada, MD;  Location: [facility];  Service: Endoscopy;;  . GIVENS CAPSULE STUDY N/A 11/08/2018   Procedure: GIVENS CAPSULE STUDY;  Surgeon: Lavena Bullion, DO;  Location: [facility];  Service: Gastroenterology;  Laterality: N/A;  . IR ANGIOGRAM SELECTIVE EACH ADDITIONAL VESSEL  11/10/2018  . IR ANGIOGRAM VISCERAL SELECTIVE  11/10/2018  . IR ANGIOGRAM VISCERAL SELECTIVE  11/10/2018  . IR EMBO ART  VEN HEMORR LYMPH EXTRAV  INC GUIDE ROADMAPPING  11/10/2018  . IR US GUIDE VASC ACCESS RIGHT  11/10/2018  . KNEE SURGERY Bilateral 1985   Arthroscopic for torn cartilage and ligaments.     Family History  Problem Relation Age of Onset  . Cancer Mother        uncertain what was wrong--mother would not share, but gradual decline.  . Hypertension Mother   . Hypertension Father    Social History   Tobacco Use  . Smoking status: Never Smoker  . Smokeless tobacco: Never Used  Substance Use Topics  . Alcohol use: Yes    Comment: 44 oz daily  . Drug use: No   Allergies  Allergen Reactions  . Hctz [Hydrochlorothiazide] Rash    Current Outpatient Medications on File Prior to Visit  Medication Sig Dispense Refill  . losartan-hydrochlorothiazide (HYZAAR) 100-12.5 MG tablet Take  1 tablet by mouth daily. 90 tablet 3  . ondansetron (ZOFRAN) 4 MG tablet Take 1 tablet (4 mg total) by mouth every 6 (six) hours as needed for nausea. 20 tablet 0   No current facility-administered medications on file prior to visit.          Review of Systems  Constitutional: Positive for appetite change (appetite is now improved and near baseline). Negative for chills, fatigue (improved) and fever.  HENT: Negative for sore throat and trouble swallowing.   Respiratory: Negative for  cough and shortness of breath.   Cardiovascular: Negative for chest pain, palpitations and leg swelling.  Gastrointestinal: Negative for abdominal pain, constipation, diarrhea, nausea and vomiting.       Denies actual pain and not even sure that he would call it discomfort-has abnormal sensation if he folds his arms across his abdomen  Endocrine: Negative for polydipsia, polyphagia and polyuria.  Genitourinary: Negative for dysuria and frequency.  Musculoskeletal: Negative for back pain and gait problem.  Neurological: Negative for dizziness and headaches.  Hematological: Negative for adenopathy. Does not bruise/bleed easily.       Objective:   Physical Exam Vitals signs and nursing note reviewed. Chaperone present: patient is accompanied by his wife.  Constitutional:      General: He is not in acute distress.    Appearance: Normal appearance. He is obese. He is not ill-appearing.  Neck:     Musculoskeletal: Normal range of motion and neck supple. No neck rigidity or muscular tenderness.     Vascular: No carotid bruit.  Cardiovascular:     Rate and Rhythm: Normal rate and regular rhythm.  Pulmonary:     Effort: Pulmonary effort is normal.     Breath sounds: Normal breath sounds.  Abdominal:     General: Abdomen is flat. There is no distension.     Palpations: Abdomen is soft.     Tenderness: There is abdominal tenderness (does not describe pain but has discomfort with palpation over the upper abdomen-right, mid and left). There is no right CVA tenderness, left CVA tenderness, guarding or rebound.  Lymphadenopathy:     Cervical: No cervical adenopathy.  Skin:    General: Skin is warm and dry.     Coloration: Skin is not jaundiced.  Neurological:     General: No focal deficit present.     Mental Status: He is alert and oriented to person, place, and time.  Psychiatric:        Mood and Affect: Mood normal.        Behavior: Behavior normal.        Thought Content: Thought content  normal.        Judgment: Judgment normal.    BP 136/90 (BP Location: Left Arm, Patient Position: Sitting, Cuff Size: Normal)   Pulse 65   Temp 97.7 F (36.5 C) (Oral)   Resp 18   Ht 5\' 10"  (1.778 m)   Wt 173 lb (78.5 kg)   SpO2 100%   BMI 24.82 kg/m         Assessment & Plan:  1. Anemia due to GI blood loss Patient is status post hospitalization from January 2 through November 14, 2018 with discharge diagnosis of GI bleed as well as pancreatic mass, anemia, hypertension, malnutrition and left eye conjunctivitis.  Patient's hemoglobin upon initial presentation was 7 and patient reported throwing up blood and dark stools/melena prior to ED presentation.  Patient did have EGD colonoscopy and video capsule which failed  to show the source of patient's bleeding.  Patient did receive 5 units of packed red blood cells in it patient's initial visit here to the clinic on 11/19/2018 patient had repeat CBC with hemoglobin of 10.5 and patient will have repeat CBC at today's visit in follow-up of his anemia.  - CBC with Differential  2. Essential hypertension Patient had elevated blood pressure during recent hospitalization and was prescribed blood pressure medication at the time of hospital discharge.  Patient states that when he was seen here in the office on November 19, 2018 he was told that he no longer needed to take his blood pressure medication.  Patient is no longer on blood pressure medication and blood pressure is reasonably controlled however patient with diastolic blood pressure of 90.  Discussed with patient that his goal blood pressure should be around 130/80 and DASH diet encouraged as patient does not really wish to take blood pressure medication  3. 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  Patient had GI consultation during his hospitalization and patient reports that he has upcoming GI follow-up.   GI referral was placed to patient's visit here in the office on November 19, 2018.  Lipase level will be done at today's visit. - Lipase  4. Protein-calorie malnutrition, moderate ([place]) Patient with moderate protein calorie malnutrition during his hospitalization.  Patient reports that he has been able to keep down food status post hospitalization and feels that his nutritional status is likely improved.  Patient will have CMP done in follow-up. - Comprehensive metabolic panel  An After Visit Summary was printed and given to the patient.  Return in about 2 months (around 02/26/2019) for anemia.

## 2018-12-29 ENCOUNTER — Telehealth: Payer: Self-pay | Admitting: *Deleted

## 2018-12-29 LAB — CBC WITH DIFFERENTIAL/PLATELET
Basophils Absolute: 0 x10E3/uL (ref 0.0–0.2)
Basos: 1 %
EOS (ABSOLUTE): 0.6 x10E3/uL — ABNORMAL HIGH (ref 0.0–0.4)
Eos: 12 %
Hematocrit: 35.6 % — ABNORMAL LOW (ref 37.5–51.0)
Hemoglobin: 10.8 g/dL — ABNORMAL LOW (ref 13.0–17.7)
Immature Grans (Abs): 0 x10E3/uL (ref 0.0–0.1)
Immature Granulocytes: 0 %
Lymphocytes Absolute: 1.2 x10E3/uL (ref 0.7–3.1)
Lymphs: 25 %
MCH: 21.8 pg — ABNORMAL LOW (ref 26.6–33.0)
MCHC: 30.3 g/dL — ABNORMAL LOW (ref 31.5–35.7)
MCV: 72 fL — ABNORMAL LOW (ref 79–97)
Monocytes Absolute: 0.4 x10E3/uL (ref 0.1–0.9)
Monocytes: 9 %
Neutrophils Absolute: 2.6 x10E3/uL (ref 1.4–7.0)
Neutrophils: 53 %
Platelets: 350 x10E3/uL (ref 150–450)
RBC: 4.96 x10E6/uL (ref 4.14–5.80)
RDW: 18.1 % — ABNORMAL HIGH (ref 11.6–15.4)
WBC: 4.8 x10E3/uL (ref 3.4–10.8)

## 2018-12-29 LAB — COMPREHENSIVE METABOLIC PANEL WITH GFR
ALT: 43 IU/L (ref 0–44)
AST: 30 IU/L (ref 0–40)
Albumin/Globulin Ratio: 1.9 (ref 1.2–2.2)
Albumin: 4 g/dL (ref 3.8–4.9)
Alkaline Phosphatase: 93 IU/L (ref 39–117)
BUN/Creatinine Ratio: 9 (ref 9–20)
BUN: 8 mg/dL (ref 6–24)
Bilirubin Total: 1.1 mg/dL (ref 0.0–1.2)
CO2: 22 mmol/L (ref 20–29)
Calcium: 8.7 mg/dL (ref 8.7–10.2)
Chloride: 104 mmol/L (ref 96–106)
Creatinine, Ser: 0.86 mg/dL (ref 0.76–1.27)
GFR calc Af Amer: 111 mL/min/1.73
GFR calc non Af Amer: 96 mL/min/1.73
Globulin, Total: 2.1 g/dL (ref 1.5–4.5)
Glucose: 99 mg/dL (ref 65–99)
Potassium: 4.3 mmol/L (ref 3.5–5.2)
Sodium: 139 mmol/L (ref 134–144)
Total Protein: 6.1 g/dL (ref 6.0–8.5)

## 2018-12-29 LAB — LIPASE: Lipase: 5 U/L — ABNORMAL LOW (ref 13–78)

## 2018-12-29 NOTE — Telephone Encounter (Signed)
Medical Assistant left message on patient's home and cell voicemail. Voicemail states to give a call back to Nubia with [facility] at [phone].  

## 2018-12-29 NOTE — Telephone Encounter (Signed)
-----   Message from Antony Blackbird, MD sent at 12/29/2018  6:59 AM EST ----- Patient with stable anemia with hemoglobin of 10.8. Patient's complete metabolic panel is normal. Lipase (pancreatic enzyme) is no longer elevated. Keep GI follow-up

## 2018-12-30 ENCOUNTER — Telehealth: Payer: Self-pay | Admitting: Family Medicine

## 2018-12-30 NOTE — Telephone Encounter (Signed)
Patient called back and told the CMP was normal and the his lipase is normal.  Patient advised to keep Gi follow up appointment.

## 2018-12-30 NOTE — Telephone Encounter (Signed)
Follow up    Pt returning call for nubia about results. Please f/u

## 2019-02-26 ENCOUNTER — Ambulatory Visit: Payer: Medicaid Other | Admitting: Family Medicine

## 2019-03-04 ENCOUNTER — Encounter: Payer: Self-pay | Admitting: Family Medicine

## 2019-03-04 ENCOUNTER — Other Ambulatory Visit: Payer: Self-pay

## 2019-03-04 ENCOUNTER — Ambulatory Visit: Payer: Medicaid Other | Attending: Family Medicine | Admitting: Family Medicine

## 2019-03-04 DIAGNOSIS — I10 Essential (primary) hypertension: Secondary | ICD-10-CM

## 2019-03-04 DIAGNOSIS — D500 Iron deficiency anemia secondary to blood loss (chronic): Secondary | ICD-10-CM | POA: Diagnosis not present

## 2019-03-04 DIAGNOSIS — K8689 Other specified diseases of pancreas: Secondary | ICD-10-CM | POA: Diagnosis not present

## 2019-03-04 NOTE — Progress Notes (Signed)
Virtual Visit via Telephone Note  I connected with Joe [name]  on 03/04/19 at  3:50 PM EDT by telephone and verified that I am speaking with the correct person using two identifiers.   I discussed the limitations, risks, security and privacy concerns of performing an evaluation and management service by telephone and the availability of in person appointments. I also discussed with the patient that there may be a patient responsible charge related to this service. The patient expressed understanding and agreed to proceed.  Patient Location: Home Provider Location: Office Others participating in call: Emilio Aspen, CMA   History of Present Illness:      58 yo male seen in follow-up of Hypertension, pancreatic mass seen on CT in January 2020 due to his hospitalization for GI blood loss. Patient was hospitalized from 11/05/2018-11/14/2018 due to presenting to the ED with epigastric abdominal pain along with emesis containing blood and black stools. He had EGD/colonoscopy and video capsule which failed to show the source of bleeding but Hgb in the ED was 7 for which he received 5 units of PRBC's during his admission. 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 Patient also had imaging including [place] and MRI which showed a mass in the head of the pancreas. Patient reports that he currently feels well. He has had no abdominal pain, no nausea or vomiting. No blood in the stools or dark stools. He feels that his blood pressure has been controlled and he has had no headaches or dizziness. He reports that he was not contacted regarding follow-up appointment with GI after his last visit here.    Past Medical History:  Diagnosis Date  . Hypertension 2016    Past Surgical History:  Procedure Laterality Date  . COLONOSCOPY WITH PROPOFOL N/A 11/08/2018   Procedure: COLONOSCOPY WITH PROPOFOL;  Surgeon: Lavena Bullion, DO;   Location: [facility];  Service: Gastroenterology;  Laterality: N/A;  . ESOPHAGOGASTRODUODENOSCOPY (EGD) WITH PROPOFOL N/A 11/06/2018   Procedure: ESOPHAGOGASTRODUODENOSCOPY (EGD) WITH PROPOFOL;  Surgeon: Carol Ada, MD;  Location: [facility];  Service: Endoscopy;  Laterality: N/A;  . EUS  11/06/2018   Procedure: FULL UPPER ENDOSCOPIC ULTRASOUND (EUS) RADIAL;  Surgeon: Carol Ada, MD;  Location: [facility];  Service: Endoscopy;;  . GIVENS CAPSULE STUDY N/A 11/08/2018   Procedure: GIVENS CAPSULE STUDY;  Surgeon: Lavena Bullion, DO;  Location: [facility];  Service: Gastroenterology;  Laterality: N/A;  . IR ANGIOGRAM SELECTIVE EACH ADDITIONAL VESSEL  11/10/2018  . IR ANGIOGRAM VISCERAL SELECTIVE  11/10/2018  . IR ANGIOGRAM VISCERAL SELECTIVE  11/10/2018  . IR EMBO ART  VEN HEMORR LYMPH EXTRAV  INC GUIDE ROADMAPPING  11/10/2018  . IR US GUIDE VASC ACCESS RIGHT  11/10/2018  . KNEE SURGERY Bilateral 1985   Arthroscopic for torn cartilage and ligaments.      Family History  Problem Relation Age of Onset  . Cancer Mother        uncertain what was wrong--mother would not share, but gradual decline.  . Hypertension Mother   . Hypertension Father     Social History   Tobacco Use  . Smoking status: Never Smoker  . Smokeless tobacco: Never Used  Substance Use Topics  . Alcohol use: Yes    Comment: 44 oz daily  . Drug use: No     Allergies  Allergen Reactions  . Hctz [Hydrochlorothiazide] Rash    Review of Systems  Constitutional:  Negative for chills, fever, malaise/fatigue and weight loss.  HENT: Negative for congestion and sore throat.   Respiratory: Negative for cough and shortness of breath.   Cardiovascular: Negative for chest pain and palpitations.  Gastrointestinal: Negative for abdominal pain, blood in stool, constipation, diarrhea, heartburn, melena, nausea and vomiting.  Genitourinary: Negative for dysuria and frequency.  Musculoskeletal: Negative for back pain and myalgias.   Skin: Negative for itching and rash.  Neurological: Negative for dizziness and headaches.  Endo/Heme/Allergies: Negative for polydipsia. Does not bruise/bleed easily.     Observations/Objective: No vital signs or physical exam conducted as visit was done via telephone  Assessment and Plan: 1. Pancreatic mass Patient was referred to GI after his last visit in follow-up of a pancreatic mass seen on CT during his hospitalization in January of this year. Patient reports that he never heard from GI therefore I will place new referral but also order MRI of the abdomen in follow-up as MRI or cross sectional CT were mentioned as appropriate follow-up from his initial CT.  - MR Abdomen W Wo Contrast; Future  2. Essential hypertension Patient reports that his blood pressure is controlled on his current medication. Patient advised to follow DASH diet and continue to monitor BP. Continue current medication  3. Anemia due to GI blood loss Patient reports no further issues with GI blood loss and he has been asked to follow-up in office in 6-8 weeks for blood work, in-person follow-up   Follow Up Instructions:Return in about 8 weeks (around 04/29/2019) for pancreatic mass/HTN/Anemia.    I discussed the assessment and treatment plan with the patient. The patient was provided an opportunity to ask questions and all were answered. The patient agreed with the plan and demonstrated an understanding of the instructions.   The patient was advised to call back or seek an in-person evaluation if the symptoms worsen or if the condition fails to improve as anticipated.  I provided 14 minutes of non-face-to-face time during this encounter.   Antony Blackbird, MD

## 2019-03-04 NOTE — Progress Notes (Signed)
2 mo f/u for anemia Per pt he is feeling fine now.

## 2019-03-05 ENCOUNTER — Encounter: Payer: Self-pay | Admitting: Family Medicine

## 2019-03-09 ENCOUNTER — Telehealth: Payer: Self-pay | Admitting: *Deleted

## 2019-03-09 NOTE — Telephone Encounter (Signed)
Called [employer] and spoke with Barnetta Chapel to get Prior Auth for patient MR of the Abdomen with and without Contrast. Authorization was approved and case number is [id number] and was informed approval letter will be faxed to office. Called [facility] [phone] and spoke with Manus Gunning and she stated she will call patient and schedule that appointment.

## 2019-03-11 ENCOUNTER — Other Ambulatory Visit: Payer: Self-pay

## 2019-03-11 ENCOUNTER — Other Ambulatory Visit (INDEPENDENT_AMBULATORY_CARE_PROVIDER_SITE_OTHER): Payer: Medicaid Other

## 2019-03-11 ENCOUNTER — Ambulatory Visit (INDEPENDENT_AMBULATORY_CARE_PROVIDER_SITE_OTHER): Payer: Medicaid Other | Admitting: Internal Medicine

## 2019-03-11 ENCOUNTER — Encounter: Payer: Self-pay | Admitting: Internal Medicine

## 2019-03-11 VITALS — BP 140/80 | HR 69 | Temp 97.5°F | Ht 70.5 in | Wt 167.0 lb

## 2019-03-11 DIAGNOSIS — I728 Aneurysm of other specified arteries: Secondary | ICD-10-CM | POA: Diagnosis not present

## 2019-03-11 DIAGNOSIS — I10 Essential (primary) hypertension: Secondary | ICD-10-CM

## 2019-03-11 DIAGNOSIS — D509 Iron deficiency anemia, unspecified: Secondary | ICD-10-CM | POA: Diagnosis not present

## 2019-03-11 DIAGNOSIS — K8689 Other specified diseases of pancreas: Secondary | ICD-10-CM | POA: Diagnosis not present

## 2019-03-11 HISTORY — DX: Aneurysm of other specified arteries: I72.8

## 2019-03-11 HISTORY — DX: Other specified diseases of pancreas: K86.89

## 2019-03-11 LAB — VITAMIN B12: Vitamin B-12: 534 pg/mL (ref 211–911)

## 2019-03-11 LAB — COMPREHENSIVE METABOLIC PANEL
ALT: 41 U/L (ref 0–53)
AST: 39 U/L — ABNORMAL HIGH (ref 0–37)
Albumin: 4.1 g/dL (ref 3.5–5.2)
Alkaline Phosphatase: 93 U/L (ref 39–117)
BUN: 12 mg/dL (ref 6–23)
CO2: 24 mEq/L (ref 19–32)
Calcium: 8.8 mg/dL (ref 8.4–10.5)
Chloride: 106 mEq/L (ref 96–112)
Creatinine, Ser: 0.94 mg/dL (ref 0.40–1.50)
GFR: 99.85 mL/min (ref >60.00)
Glucose, Bld: 185 mg/dL — ABNORMAL HIGH (ref 70–99)
Potassium: 3.7 mEq/L (ref 3.5–5.1)
Sodium: 139 mEq/L (ref 135–145)
Total Bilirubin: 0.9 mg/dL (ref 0.2–1.2)
Total Protein: 6.8 g/dL (ref 6.0–8.3)

## 2019-03-11 LAB — CBC
HCT: 41 % (ref 39.0–52.0)
Hemoglobin: 13.1 g/dL (ref 13.0–17.0)
MCHC: 31.8 g/dL (ref 30.0–36.0)
MCV: 71.8 fl — ABNORMAL LOW (ref 78.0–100.0)
Platelets: 252 10*3/uL (ref 150.0–400.0)
RBC: 5.71 Mil/uL (ref 4.22–5.81)
RDW: 21.9 % — ABNORMAL HIGH (ref 11.5–15.5)
WBC: 3.4 10*3/uL — ABNORMAL LOW (ref 4.0–10.5)

## 2019-03-11 LAB — FERRITIN: Ferritin: 28.7 ng/mL (ref 22.0–322.0)

## 2019-03-11 NOTE — Assessment & Plan Note (Addendum)
CBC Latest Ref Rng & Units 03/11/2019 12/28/2018 11/19/2018  WBC 4.0 - 10.5 K/uL 3.4(L) 4.8 7.5  Hemoglobin 13.0 - 17.0 g/dL 13.1 10.8(L) 10.5(L)  Hematocrit 39.0 - 52.0 % 41.0 35.6(L) 33.2(L)  Platelets 150.0 - 400.0 K/uL 252.0 350 672(H)   Lab Results  Component Value Date   FERRITIN 28.7 03/11/2019   Labs today show the following.  Looks like he could use some iron supplementation.  This is not a cause for his dizziness.  I am not sure he was microcytic on the basis of iron deficiency as he has had a borderline microcytosis in 2018.Marland Kitchen

## 2019-03-11 NOTE — Assessment & Plan Note (Signed)
Appears to be successfully treated.  Await MRI.

## 2019-03-11 NOTE — Patient Instructions (Signed)
Good to talk today.  We will check your blood pressure and get some labs to follow-up on things.  I will discuss with dr. Benson Norway and we will plan follow-up after the MRI.  I appreciate the opportunity to care for you. Gatha Mayer, MD, [facility]

## 2019-03-11 NOTE — Progress Notes (Signed)
TELEHEALTH ENCOUNTER IN SETTING OF COVID-19 PANDEMIC - REQUESTED BY PATIENT SERVICE PROVIDED BY TELEMEDECINE - TYPE: Telephone, AV not possible PATIENT LOCATION: Home PATIENT HAS CONSENTED TO TELEHEALTH VISIT PROVIDER LOCATION: OFFICE REFERRING PROVIDER: Dr[facility] Bil[facility] PARTICIPANTS OTHER THAN PATIENT: No one TIME SPENT ON CALL 15 minutes    Joe [name] 58 y.o. 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  Await MRI.  Microcytic anemia CBC Latest Ref Rng & Units 03/11/2019 12/28/2018 11/19/2018  WBC 4.0 - 10.5 K/uL 3.4(L) 4.8 7.5  Hemoglobin 13.0 - 17.0 g/dL 13.1 10.8(L) 10.5(L)  Hematocrit 39.0 - 52.0 % 41.0 35.6(L) 33.2(L)  Platelets 150.0 - 400.0 K/uL 252.0 350 672(H)   Lab Results  Component Value Date   FERRITIN 28.7 03/11/2019   Labs today show the following.  Looks like he could use some iron supplementation.  This is not a cause for his dizziness.  I am not sure he was microcytic on the basis of iron deficiency as he has had a borderline microcytosis in 2018.Marland Kitchen  Hypertension I had him come in for a BP check and it was 140/80.  Will need to follow-up with primary care to see if he needs to get back on his blood pressure medicine.  Probably so.  I called Dr. Benson Norway and the patient has been following with him and will continue to do so.  I made Dr. Benson Norway aware of the pending MRI appointment and we will let him follow-up on the studies.  I will make the patient aware of this plan as well.   Subjective:   Chief Complaint: Follow-up of pancreatic mass  HPI The patient is a 58 year old man who had a pseudoaneurysm of the gastroduodenal artery and the context of a pancreatic mass.  He was admitted to the hospital and cared for by Dr. Benson Norway in our service and he was seeing primary care and they  referred him back to [place].  Unbeknownst to them he is actually seen Dr. Benson Norway a couple of times after the hospital apparently.  He needs imaging follow-up of the mass.  He did have embolization of his gastroduodenal artery which treated his bleeding and he reports no more bleeding though he is worried he is not on his blood pressure medicine and that he is feeling dizzy.  He had a microcytic anemia.  He has not had labs checked since February. Allergies  Allergen Reactions  . Hctz [Hydrochlorothiazide] Rash   Current Meds  Medication Sig  . ondansetron (ZOFRAN) 4 MG tablet Take 1 tablet (4 mg total) by mouth every 6 (six) hours as needed for nausea.   Past Medical History:  Diagnosis Date  . Hypertension 2016  . Mass of pancreas 03/11/2019  . Microcytic anemia   . 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 COLONOSCOPY WITH PROPOFOL N/A 11/08/2018   Procedure: COLONOSCOPY WITH PROPOFOL;  Surgeon: Lavena Bullion, DO;  Location: [facility];  Service: Gastroenterology;  Laterality: N/A;  . ESOPHAGOGASTRODUODENOSCOPY (EGD) WITH PROPOFOL N/A 11/06/2018   Procedure: ESOPHAGOGASTRODUODENOSCOPY (EGD) WITH PROPOFOL;  Surgeon: Carol Ada, MD;  Location: [facility];  Service: Endoscopy;  Laterality: N/A;  . EUS  11/06/2018   Procedure: FULL UPPER ENDOSCOPIC ULTRASOUND (EUS) RADIAL;  Surgeon: Carol Ada, MD;  Location: [facility];  Service:  Endoscopy;;  . GIVENS CAPSULE STUDY N/A 11/08/2018   Procedure: GIVENS CAPSULE STUDY;  Surgeon: Lavena Bullion, DO;  Location: [facility];  Service: Gastroenterology;  Laterality: N/A;  . IR ANGIOGRAM SELECTIVE EACH ADDITIONAL VESSEL  11/10/2018  . IR ANGIOGRAM VISCERAL SELECTIVE  11/10/2018  . IR ANGIOGRAM VISCERAL SELECTIVE  11/10/2018  . IR EMBO ART  VEN HEMORR LYMPH EXTRAV  INC GUIDE ROADMAPPING  11/10/2018  . IR US GUIDE VASC ACCESS RIGHT  11/10/2018  . KNEE SURGERY  Bilateral 1985   Arthroscopic for torn cartilage and ligaments.     Social History   Social History Narrative   Works second shift at [employer] and [employer] with his partner of 30+ years and 61 yo daughter, [name].      family history includes Cancer in his mother; Hypertension in his father and mother.   Review of Systems Otherwise negative

## 2019-03-11 NOTE — Assessment & Plan Note (Signed)
Await follow-up MRI

## 2019-03-11 NOTE — Assessment & Plan Note (Signed)
I had him come in for a BP check and it was 140/80.  Will need to follow-up with primary care to see if he needs to get back on his blood pressure medicine.  Probably so.

## 2019-03-15 NOTE — Progress Notes (Signed)
Let him know that these labs are ok overall EXCEPT:  Blood sugar 185 is on the high side - he may be developing diabetes  His iron level is low normal  No cause of dizziness seen here unless blood sugars are even higher at times that is possible cause I suppose  My recommendations are:  F/u PCP as planned - they can check his sugars again and BP - was 140/80 when he came in which is just slightly high so may be time to go back on meds - check with PCP Start ferrous sulfate 325 mg po gd Dr. Benson Norway will plan to follow-up with him - the patient should call Dr. Benson Norway after he has the previously scheduled MRI done and see what Dr. Benson Norway thinks (patient had been seeing Dr. Benson Norway but PCP was unaware and referred to me) I am ccing PCP and will cc Dr. Benson Norway

## 2019-03-16 ENCOUNTER — Telehealth: Payer: Self-pay | Admitting: Family Medicine

## 2019-03-16 NOTE — Telephone Encounter (Signed)
Anderson Malta with [facility] is calling in regards to prior auth for MRI abdomen with or without contrast please follow up.

## 2019-03-16 NOTE — Telephone Encounter (Signed)
I think that GI changed the MRI order to with and without in follow-up of his pancreatic mass. I am not sure if you need to look at the GI note and call that doctor's assistant to see if GI will get the approval or see if you can obtain approval

## 2019-03-17 ENCOUNTER — Other Ambulatory Visit: Payer: Self-pay

## 2019-03-17 MED ORDER — FERROUS SULFATE 325 (65 FE) MG PO TABS: 325.0000 mg | ORAL_TABLET | Freq: Every day | ORAL | 3 refills | Status: DC

## 2019-03-17 NOTE — Telephone Encounter (Signed)
Can you call radiology and see if they have the required pre-auth for the MRI

## 2019-03-17 NOTE — Telephone Encounter (Signed)
Per pt chart, it looks like he is already scheduled for MRI  Of Abd for Mar 18, 2019.

## 2019-03-18 ENCOUNTER — Encounter: Payer: Self-pay | Admitting: Family Medicine

## 2019-03-18 ENCOUNTER — Other Ambulatory Visit: Payer: Medicaid Other

## 2019-03-18 ENCOUNTER — Telehealth: Payer: Self-pay | Admitting: Family Medicine

## 2019-03-18 NOTE — Telephone Encounter (Signed)
Please find about rescheduling his MRI and please schedule patient for office follow-up and I will type a work note for today due to missing work for MRI which could not be done

## 2019-03-18 NOTE — Telephone Encounter (Signed)
Ok, please follow-up to make sure that his MRI gets scheduled and that new preauth is obtained if needed

## 2019-03-18 NOTE — Telephone Encounter (Signed)
Patient called back stating he would like to know why his appt was cancelled and would like to know If he can get a work note for his employer because he needs one for the day he missed. Please follow up.

## 2019-03-18 NOTE — Telephone Encounter (Signed)
Spoke with patient and informed him that his letter for his work will be at the front office to pick up. Patient verbalized understanding. Staff called [employer] and location for patient MRI was changed and Auth number is [id number]. Called [facility] and spoke with Mardene Celeste and she stated she will call patient and resch that MRI appt. Staff called patient and informed him that [facility] will be calling him to resch that MRI and patient verbalized understanding.

## 2019-03-18 NOTE — Telephone Encounter (Signed)
Per pt chart, patient was scheduled for his MRI today 03/18/19. When staff looked in patients chart again to see if he went to his appt, patient appt was cancelled. Called patient to see why his appt was cancelled and patient stated he did not really understand why but he still can not go back to work. Staff called Central Scheduling Department to get more information as to why patient MRI was cancelled and was informed by Morey Hummingbird that it looks like it was approved for [facility] but patient was scheduled with [place]. Staff called [facility] and LM on Amy VM (referral coordinator) to call staff back.

## 2019-03-18 NOTE — Telephone Encounter (Signed)
Spoke with patient and informed him that his letter for his work will be at the front office to pick up. Patient verbalized understanding. Staff called [employer] and location for patient MRI was changed and Auth number is [id number]. Called [facility] and spoke with Mardene Celeste and she stated she will call patient and resch that MRI appt. Staff called patient and informed him that [facility] will be calling him to resch that MRI and patient verbalized understanding.

## 2019-03-18 NOTE — Progress Notes (Signed)
Patient ID: Joe Roan Sr., male   DOB: 1960/11/28, 58 y.o.   MRN: [id number]   Patient was scheduled to have MRI today in follow-up of pancreatic mass however MRI was canceled due to issue with preauthorization for the test.  MRI will be rescheduled but patient needs note for missing work today due to taking the day off to have his MRI done.  Letter will be provided.

## 2019-03-18 NOTE — Telephone Encounter (Signed)
Patient called stating his MRI was cancelled and wanted to FU with PCP. Please follow up

## 2019-03-19 ENCOUNTER — Other Ambulatory Visit: Payer: Self-pay | Admitting: Family Medicine

## 2019-03-19 DIAGNOSIS — R7309 Other abnormal glucose: Secondary | ICD-10-CM

## 2019-03-19 NOTE — Progress Notes (Signed)
Patient ID: Joe Roan Sr., male   DOB: [birth year], 58 y.o.   MRN: [id number]   Patient with recent GI visit at which he blood pressure was elevated and blood work showed an elevated glucose of 185. He will be contacted to have nurse visit for BP recheck as well as labs and then office follow-up to discuss results and the need for medication to be prescribed.

## 2019-03-31 ENCOUNTER — Encounter: Payer: Self-pay | Admitting: Emergency Medicine

## 2019-03-31 ENCOUNTER — Telehealth: Payer: Self-pay | Admitting: *Deleted

## 2019-03-31 ENCOUNTER — Ambulatory Visit: Payer: Medicaid Other | Attending: Family Medicine | Admitting: Emergency Medicine

## 2019-03-31 ENCOUNTER — Other Ambulatory Visit: Payer: Self-pay

## 2019-03-31 VITALS — BP 148/99 | HR 76 | Temp 97.9°F | Resp 16 | Ht 70.5 in | Wt 166.2 lb

## 2019-03-31 DIAGNOSIS — Z0131 Encounter for examination of blood pressure with abnormal findings: Secondary | ICD-10-CM

## 2019-03-31 DIAGNOSIS — I10 Essential (primary) hypertension: Secondary | ICD-10-CM | POA: Diagnosis not present

## 2019-03-31 DIAGNOSIS — R7309 Other abnormal glucose: Secondary | ICD-10-CM

## 2019-03-31 LAB — POCT GLYCOSYLATED HEMOGLOBIN (HGB A1C): Hemoglobin A1C: 5.5 % (ref 4.0–5.6)

## 2019-03-31 LAB — GLUCOSE, POCT (MANUAL RESULT ENTRY): POC Glucose: 129 mg/dL — AB (ref 70–99)

## 2019-03-31 NOTE — Telephone Encounter (Signed)
Patient came into office today and informed nurse stating he have not received a call to schedule the MRI. Staff called [facility] to check on the status of patient MRI. Staff spoke with Leanor Kail and she stated that patient is not scheduled due to Auth. Staff asked for more details and Leanor Kail stated the note stated that an Prior Auth was needed for patient before the appt can be made. Informed Leanor Kail that staff called patient insurance on 03-18-19 and the authorization was given and staff called them to get the appt scheduled but one of their staff told RMA that they will be the one that will call patient to sch appt. Per Leanor Kail she will check on the Auth also and then she will call patient to schedule his MRI.

## 2019-03-31 NOTE — Progress Notes (Signed)
Patient arrived ambulatory, alert and orientated to clinic.  Patient is in clinic for blood pressure and glucose check.  Vitals and POCT, A1C performed.  Patient lead to lab for samples and advised to stop by front window for two week follow up.  Patient acknowledged understanding of advice.

## 2019-04-01 LAB — BASIC METABOLIC PANEL WITH GFR
BUN/Creatinine Ratio: 13 (ref 9–20)
BUN: 12 mg/dL (ref 6–24)
CO2: 19 mmol/L — ABNORMAL LOW (ref 20–29)
Calcium: 9.2 mg/dL (ref 8.7–10.2)
Chloride: 106 mmol/L (ref 96–106)
Creatinine, Ser: 0.96 mg/dL (ref 0.76–1.27)
GFR calc Af Amer: 101 mL/min/1.73
GFR calc non Af Amer: 87 mL/min/1.73
Glucose: 122 mg/dL — ABNORMAL HIGH (ref 65–99)
Potassium: 4 mmol/L (ref 3.5–5.2)
Sodium: 138 mmol/L (ref 134–144)

## 2019-04-01 LAB — HEMOGLOBIN A1C
Est. average glucose Bld gHb Est-mCnc: 117 mg/dL
Hgb A1c MFr Bld: 5.7 % — ABNORMAL HIGH (ref 4.8–5.6)

## 2019-04-05 ENCOUNTER — Telehealth: Payer: Self-pay | Admitting: Emergency Medicine

## 2019-04-05 NOTE — Telephone Encounter (Signed)
Nurse called the patient's home phone number but received no answer and message was left on the voicemail for the patient to call back.  Return phone number given. 

## 2019-04-07 NOTE — Telephone Encounter (Signed)
Nurse called the patient's home phone number but received no answer and message was left on the voicemail for the patient to call back.  Return phone number given. 

## 2019-04-07 NOTE — Telephone Encounter (Signed)
Patient called back stating it would be best to contact him tomorrow morning as early as possible. Please follow up

## 2019-04-09 NOTE — Telephone Encounter (Signed)
Nurse called the patient's home phone number but received no answer and message was left on the voicemail for the patient to call back.  Return phone number given. 

## 2019-04-14 ENCOUNTER — Other Ambulatory Visit: Payer: Self-pay

## 2019-04-14 ENCOUNTER — Ambulatory Visit: Payer: Medicaid Other | Attending: Family Medicine | Admitting: Family Medicine

## 2019-04-14 ENCOUNTER — Telehealth: Payer: Self-pay

## 2019-04-14 ENCOUNTER — Encounter: Payer: Self-pay | Admitting: Family Medicine

## 2019-04-14 DIAGNOSIS — D500 Iron deficiency anemia secondary to blood loss (chronic): Secondary | ICD-10-CM | POA: Diagnosis not present

## 2019-04-14 DIAGNOSIS — I10 Essential (primary) hypertension: Secondary | ICD-10-CM | POA: Diagnosis not present

## 2019-04-14 DIAGNOSIS — R7303 Prediabetes: Secondary | ICD-10-CM | POA: Diagnosis not present

## 2019-04-14 DIAGNOSIS — K8689 Other specified diseases of pancreas: Secondary | ICD-10-CM

## 2019-04-14 DIAGNOSIS — I728 Aneurysm of other specified arteries: Secondary | ICD-10-CM

## 2019-04-14 DIAGNOSIS — R42 Dizziness and giddiness: Secondary | ICD-10-CM

## 2019-04-14 MED ORDER — FERROUS SULFATE 325 (65 FE) MG PO TABS: ORAL_TABLET | ORAL | 3 refills | Status: DC

## 2019-04-14 MED ORDER — BLOOD PRESSURE MONITOR KIT: PACK | 0 refills | Status: AC

## 2019-04-14 MED ORDER — LOSARTAN POTASSIUM 50 MG PO TABS: 50.0000 mg | ORAL_TABLET | Freq: Every day | ORAL | 3 refills | Status: DC

## 2019-04-14 MED ORDER — METFORMIN HCL 500 MG PO TABS: ORAL_TABLET | ORAL | 3 refills | Status: DC

## 2019-04-14 NOTE — Addendum Note (Signed)
Addended by: Kathrene Bongo on: 04/14/2019 10:06 AM   Modules accepted: Orders

## 2019-04-14 NOTE — Progress Notes (Signed)
DM follow up and per pt his blood sugar at home was 152 yesterday.

## 2019-04-14 NOTE — Progress Notes (Signed)
Virtual Visit via Telephone Note  I connected with Joe [name] on 04/14/19 at 10:10 AM EDT by telephone and verified that I am speaking with the correct person using two identifiers.   I discussed the limitations, risks, security and privacy concerns of performing an evaluation and management service by telephone and the availability of in person appointments. I also discussed with the patient that there may be a patient responsible charge related to this service. The patient expressed understanding and agreed to proceed.  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  During his hospitalization he received EGD and colonoscopy and video capsule with failure to show the source of his bleeding.  Patient's hemoglobin however in the emergency department was 7 and he received 5 units of packed red blood cells during the hospitalization.  Patient CT also showed a pseudoaneurysm with a gastroduodenal artery for which he had coil embolization.  Patient had MRI ordered during his office visit here on 03/04/2019.  He has seen GI on 03/11/2019 by tele-visit.  GI was awaiting the MRI results regarding the need to determine further evaluation/treatment for pancreatic mass and pseudoaneurysm.  Unfortunately due to scheduling mixups, patient's MRI of the abdomen is actually not scheduled until tomorrow.  Patient did have repeat hemoglobin on 03/11/2019 with hemoglobin normal at 13.1 but MCV is 71.8.  At his GI appointment, patient was asked to take his blood pressure at home which was 140/80 and patient may need to restart use of  blood pressure medication.      Patient reports that he does not have a home blood pressure monitor.  He denies any headaches but states that at work recently he has had 2 episodes where he felt weak and dizzy and had to leave work so that he could go home and lie down.  He reports that during one episode he felt as if his heart was racing and he states that he had been told in the past that if he has a sensation of heart racing/palpitations to try coughing a few times to see if this would help.  Patient states that he went home to lie down and his symptoms improved after lying down to rest.  [name] recent episode was last week.       He reports that his home blood sugars have remained fairly controlled.  Patient's fasting glucose level this morning was 152.   He denies any urinary frequency, no blurred vision and no increased thirst related to his blood sugars.  He is not currently on any medication for his blood pressure but he wonders if perhaps the reason he felt bad at work recently was because of having elevated blood pressure.  He reports that he was not aware that there was already a prescription at his pharmacy for ferrous sulfate for his anemia.  He has had no abdominal pain, no nausea/vomiting/diarrhea or constipation.  No blood in the stool and no black/sticky stools since his hospitalization.  Patient is aware that his MRI is tomorrow regarding follow-up of his pancreatic mass and pseudoaneurysm.  Patient states that he also called and confirmed location  as he went to the wrong location for MRI previously.  He denies on review of systems any chest pain but did have recent sensation of palpitations.  No shortness of breath or cough.  No peripheral edema.  He has had some fatigue.  No fever or chills.  No urinary frequency or dysuria.  No loss of appetite.  No new onset of muscle or joint pain.          Past Medical History:  Diagnosis Date  . Hypertension 2016  . Mass of pancreas 03/11/2019  .  Microcytic anemia   . 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 COLONOSCOPY WITH PROPOFOL N/A 11/08/2018   Procedure: COLONOSCOPY WITH PROPOFOL;  Surgeon: Lavena Bullion, DO;  Location: [facility];  Service: Gastroenterology;  Laterality: N/A;  . ESOPHAGOGASTRODUODENOSCOPY (EGD) WITH PROPOFOL N/A 11/06/2018   Procedure: ESOPHAGOGASTRODUODENOSCOPY (EGD) WITH PROPOFOL;  Surgeon: Carol Ada, MD;  Location: [facility];  Service: Endoscopy;  Laterality: N/A;  . EUS  11/06/2018   Procedure: FULL UPPER ENDOSCOPIC ULTRASOUND (EUS) RADIAL;  Surgeon: Carol Ada, MD;  Location: [place];  Service: Endoscopy;;  . GIVENS CAPSULE STUDY N/A 11/08/2018   Procedure: GIVENS CAPSULE STUDY;  Surgeon: Lavena Bullion, DO;  Location: [facility];  Service: Gastroenterology;  Laterality: N/A;  . IR ANGIOGRAM SELECTIVE EACH ADDITIONAL VESSEL  11/10/2018  . IR ANGIOGRAM VISCERAL SELECTIVE  11/10/2018  . IR ANGIOGRAM VISCERAL SELECTIVE  11/10/2018  . IR EMBO ART  VEN HEMORR LYMPH EXTRAV  INC GUIDE ROADMAPPING  11/10/2018  . IR US GUIDE VASC ACCESS RIGHT  11/10/2018  . KNEE SURGERY Bilateral 1985   Arthroscopic for torn cartilage and ligaments.      Family History  Problem Relation Age of Onset  . Cancer Mother        uncertain what was wrong--mother would not share, but gradual decline.  . Hypertension Mother   . Hypertension Father     Social History   Tobacco Use  . Smoking status: Never Smoker  . Smokeless tobacco: Never Used  Substance Use Topics  . Alcohol use: Not Currently    Comment: 44 oz daily  . Drug use: No     Allergies  Allergen Reactions  . Hctz [Hydrochlorothiazide] Rash       Observations/Objective: No vital signs or physical exam conducted as visit was done via telephone  Assessment and Plan: 1. Essential hypertension Patient reports that he does not have a home blood pressure monitor.   Will send in order to try and obtain home blood pressure monitor for patient through home health.  Patient will start losartan 50 mg daily as patient also has elevated blood sugars and prediabetes.  Patient's blood pressure goal will be 130/80 or less.  Patient is to come into the office next week for nurse visit for blood pressure check and lab visit in follow-up of his high blood pressure, recent episodes of dizziness. - losartan (COZAAR) 50 MG tablet; Take 1 tablet (50 mg total) by mouth daily. To lower blood pressure  Dispense: 30 tablet; Refill: 3 - Blood Pressure Monitor KIT; Used to monitor blood pressure daily and as directed  Dispense: 1 each; Refill: 0  2. 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  Patient is encouraged to  start ferrous sulfate 325 mg once daily after a meal as patient still with low MCV on recent CBC.  Hemoglobin was normal as patient had received packed red blood cells during hospitalization.  Patient was made aware that the use of ferrous sulfate may cause stomach upset that he should take the medicine after eating.  Patient may also have darkening of the stools with the use of iron therapy as well as constipation and he is encouraged to take a laxative if he has not had a bowel movement in more than 3 days while on the iron therapy.  Patient will come in next week for lab visit to have CBC due to his recent episodes of dizziness and weakness and in  follow-up of anemia - ferrous sulfate 325 (65 FE) MG tablet; 1 tablet once per day after eating for anemia treatment  Dispense: 30 tablet; Refill: 3 - CBC with Differential  3. Pancreatic mass Patient is scheduled for MRI of the abdomen tomorrow in follow-up of pancreatic mass and pseudoaneurysm.  GI will notify patient regarding further follow-up based on these results.  Patient's pancreatic mass  may be contributing to his current issues with elevated blood sugars and prediabetes  4. Prediabetes Patient reports fasting blood sugar 150.  Patient did have hyperglycemia during recent hospitalization and recent hemoglobin A1c was 5.7.  Will send in prescription for metformin for patient to take once daily to see if this will help lower/normalize his blood sugars and patient will return to lab for BMP next week. - Basic Metabolic Panel - metFORMIN (GLUCOPHAGE) 500 MG tablet; 1 pill daily after the evening meal to help with blood sugar control  Dispense: 30 tablet; Refill: 3  5. 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  Patient will have MRI tomorrow and follow-up.  6. Dizziness Patient with recent complaint of onset of weakness and dizziness at work on 2 occasions with [name] recent occasion occurring last week.  Patient is to start medication for control of his blood pressure, remain well-hydrated and patient will come into clinic next week for BMP to check electrolytes and CBC in follow-up of anemia to see if there are any abnormalities which may be contributing to patient's dizziness.  Home blood pressure monitor will also be ordered for the patient. - Basic Metabolic Panel - CBC with Differential  Follow Up Instructions:Return in about 1 week (around 04/21/2019) for Hypertension/anemia/prediabetes/dizziness, lab and nurse visit blood pressure check in 1 week.    I discussed the assessment and treatment plan with the patient. The patient was provided an opportunity to ask questions and all were answered. The patient agreed with the plan and demonstrated an understanding of the instructions.   The patient was advised to call back or seek an in-person evaluation if the symptoms worsen or if the condition fails to improve as anticipated.  I provided 11 minutes of non-face-to-face time during this encounter.   Antony Blackbird, MD

## 2019-04-14 NOTE — Telephone Encounter (Signed)
Order for home BP monitor faxed to [facility]

## 2019-04-15 ENCOUNTER — Ambulatory Visit
Admission: RE | Admit: 2019-04-15 | Discharge: 2019-04-15 | Disposition: A | Discharge status: Home / Self Care | Payer: Medicaid Other | Source: Ambulatory Visit | Attending: Family Medicine | Admitting: Family Medicine

## 2019-04-15 DIAGNOSIS — K8689 Other specified diseases of pancreas: Secondary | ICD-10-CM

## 2019-04-15 MED ORDER — GADOBENATE DIMEGLUMINE 529 MG/ML IV SOLN
15.0000 mL | Freq: Once | INTRAVENOUS | Status: AC | PRN
  Administered 2019-04-15: 15 mL via INTRAVENOUS

## 2019-04-15 NOTE — Progress Notes (Signed)
He is following up with dr. Benson Norway on this. I will communicate to Dr. Benson Norway that result is in and also route to him.

## 2019-04-23 ENCOUNTER — Other Ambulatory Visit: Payer: Self-pay

## 2019-04-23 ENCOUNTER — Ambulatory Visit: Payer: Medicaid Other | Attending: Family Medicine

## 2019-04-23 NOTE — Addendum Note (Signed)
Addended by: Darliss Ridgel D on: 04/23/2019 09:45 AM   Modules accepted: Orders

## 2019-04-24 LAB — CBC WITH DIFFERENTIAL/PLATELET
Basophils Absolute: 0 x10E3/uL (ref 0.0–0.2)
Basos: 1 %
EOS (ABSOLUTE): 0.2 x10E3/uL (ref 0.0–0.4)
Eos: 4 %
Hematocrit: 39.8 % (ref 37.5–51.0)
Hemoglobin: 12.7 g/dL — ABNORMAL LOW (ref 13.0–17.7)
Immature Grans (Abs): 0 x10E3/uL (ref 0.0–0.1)
Immature Granulocytes: 0 %
Lymphocytes Absolute: 1.4 x10E3/uL (ref 0.7–3.1)
Lymphs: 38 %
MCH: 23.3 pg — ABNORMAL LOW (ref 26.6–33.0)
MCHC: 31.9 g/dL (ref 31.5–35.7)
MCV: 73 fL — ABNORMAL LOW (ref 79–97)
Monocytes Absolute: 0.3 x10E3/uL (ref 0.1–0.9)
Monocytes: 8 %
Neutrophils Absolute: 1.8 x10E3/uL (ref 1.4–7.0)
Neutrophils: 49 %
Platelets: 346 x10E3/uL (ref 150–450)
RBC: 5.44 x10E6/uL (ref 4.14–5.80)
RDW: 19.6 % — ABNORMAL HIGH (ref 11.6–15.4)
WBC: 3.7 x10E3/uL (ref 3.4–10.8)

## 2019-04-24 LAB — BASIC METABOLIC PANEL WITH GFR
BUN/Creatinine Ratio: 11 (ref 9–20)
BUN: 11 mg/dL (ref 6–24)
CO2: 19 mmol/L — ABNORMAL LOW (ref 20–29)
Calcium: 9 mg/dL (ref 8.7–10.2)
Chloride: 106 mmol/L (ref 96–106)
Creatinine, Ser: 1 mg/dL (ref 0.76–1.27)
GFR calc Af Amer: 96 mL/min/1.73
GFR calc non Af Amer: 83 mL/min/1.73
Glucose: 111 mg/dL — ABNORMAL HIGH (ref 65–99)
Potassium: 4.1 mmol/L (ref 3.5–5.2)
Sodium: 138 mmol/L (ref 134–144)

## 2019-04-29 ENCOUNTER — Telehealth: Payer: Self-pay | Admitting: Emergency Medicine

## 2019-04-29 NOTE — Telephone Encounter (Signed)
Nurse called the patient's home phone number but received no answer and message was left on the voicemail for the patient to call back.  Return phone number given. 

## 2019-05-10 ENCOUNTER — Telehealth: Payer: Self-pay

## 2019-05-10 NOTE — Telephone Encounter (Signed)
CMN for BP monitor faxed to [facility]

## 2019-05-17 ENCOUNTER — Encounter (HOSPITAL_COMMUNITY): Payer: Self-pay | Admitting: Emergency Medicine

## 2019-05-17 ENCOUNTER — Ambulatory Visit (HOSPITAL_COMMUNITY)
Admission: EM | Admit: 2019-05-17 | Discharge: 2019-05-17 | Disposition: A | Discharge status: Home / Self Care | Payer: Medicaid Other | Attending: Emergency Medicine | Admitting: Emergency Medicine

## 2019-05-17 ENCOUNTER — Other Ambulatory Visit: Payer: Self-pay

## 2019-05-17 ENCOUNTER — Telehealth: Payer: Self-pay | Admitting: Family Medicine

## 2019-05-17 DIAGNOSIS — R0602 Shortness of breath: Secondary | ICD-10-CM | POA: Diagnosis not present

## 2019-05-17 NOTE — Telephone Encounter (Signed)
Patient called stating they would like to speak to someone in regards to their BP. Patient states that once he takes a few steps he feels dizzy and his heart races. And states he is experiencing shortness of breath. Please follow up.

## 2019-05-17 NOTE — ED Triage Notes (Signed)
Pt reports SOB and dizziness with exertion, like walking up a hill or a flight of steps.  He states he can do ADL's without any problems, but with high exertion he states he feels like his heart is beating faster, he gets SOB and dizzy.

## 2019-05-17 NOTE — Telephone Encounter (Signed)
Patients call taken.  Patient identified by name and date of birth.  Patient complaining of shortness of breath and dizziness for two weeks.  Patient states it is worse going up stairs.  Patient is unable to give any blood pressure numbers.  Patient advised to go to Urgent Care or the Emergency Department to be assessed.  Patient acknowledged understanding of advice.

## 2019-05-17 NOTE — Discharge Instructions (Addendum)
Your blood pressure and pulse were normal today.  Your EKG was unchanged from your previous one in 2018.    Follow-up as scheduled with your primary care provider on 05/19/2019.   Call 911 if you develop dizziness, chest pain, shortness of breath, feeling like your heart is beating too fast, excessive sweating, nausea, or other concerning symptoms.

## 2019-05-17 NOTE — ED Provider Notes (Signed)
[facility]    CSN: [id number] Arrival date & time: 05/17/19  1448      History   Chief Complaint Chief Complaint  Patient presents with  . Shortness of Breath    with exertion    HPI Joe SCHABERG Sr. is a 58 y.o. male.   Patient presents with shortness of breath and dizziness upon exertion; no symptoms currently.  He states this occurs when he walks up a flight of steps or up a hill.  He denies current dizziness, chest pain, shortness of breath, palpitations, nausea, diaphoresis, or any other symptoms.  He has an appointment for follow-up with his primary care provider on 05/19/2019.  The history is provided by the patient.    Past Medical History:  Diagnosis Date  . Hypertension 2016  . Mass of pancreas 03/11/2019  . Microcytic anemia   . 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 Mass of pancreas 03/11/2019  . Microcytic anemia   . 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 Hypertension 11/04/2014    Past Surgical History:  Procedure Laterality Date  . COLONOSCOPY WITH PROPOFOL N/A 11/08/2018   Procedure: COLONOSCOPY WITH PROPOFOL;  Surgeon: Lavena Bullion, DO;  Location: [place];  Service: Gastroenterology;  Laterality: N/A;  . ESOPHAGOGASTRODUODENOSCOPY (EGD) WITH PROPOFOL N/A 11/06/2018   Procedure: ESOPHAGOGASTRODUODENOSCOPY (EGD) WITH PROPOFOL;  Surgeon: Carol Ada, MD;  Location: [place];  Service: Endoscopy;  Laterality: N/A;  . EUS  11/06/2018   Procedure: FULL UPPER ENDOSCOPIC ULTRASOUND (EUS) RADIAL;  Surgeon: Carol Ada, MD;  Location: [facility];  Service: Endoscopy;;  . GIVENS CAPSULE STUDY N/A 11/08/2018   Procedure: GIVENS CAPSULE STUDY;  Surgeon: Lavena Bullion, DO;  Location: [facility];  Service: Gastroenterology;  Laterality:  N/A;  . IR ANGIOGRAM SELECTIVE EACH ADDITIONAL VESSEL  11/10/2018  . IR ANGIOGRAM VISCERAL SELECTIVE  11/10/2018  . IR ANGIOGRAM VISCERAL SELECTIVE  11/10/2018  . IR EMBO ART  VEN HEMORR LYMPH EXTRAV  INC GUIDE ROADMAPPING  11/10/2018  . IR US GUIDE VASC ACCESS RIGHT  11/10/2018  . KNEE SURGERY Bilateral 1985   Arthroscopic for torn cartilage and ligaments.         Home Medications    Prior to Admission medications   Medication Sig Start Date End Date Taking? Authorizing Provider  ferrous sulfate 325 (65 FE) MG tablet 1 tablet once per day after eating for anemia treatment 04/14/19  Yes Fulp, Cammie, MD  losartan (COZAAR) 50 MG tablet Take 1 tablet (50 mg total) by mouth daily. To lower blood pressure 04/14/19  Yes Fulp, Cammie, MD  metFORMIN (GLUCOPHAGE) 500 MG tablet 1 pill daily after the evening meal to help with blood sugar control 04/14/19  Yes Fulp, Cammie, MD  Blood Pressure Monitor KIT Used to monitor blood pressure daily and as directed 04/14/19   Fulp, Cammie, MD  ondansetron (ZOFRAN) 4 MG tablet Take 1 tablet (4 mg total) by mouth every 6 (six) hours as needed for nausea. 11/14/18   Debbe Odea, MD    Family History Family History  Problem Relation Age of Onset  . Cancer Mother        uncertain what was wrong--mother would not share, but gradual decline.  . Hypertension Mother   . Hypertension Father     Social History Social History  Tobacco Use  . Smoking status: Never Smoker  . Smokeless tobacco: Never Used  Substance Use Topics  . Alcohol use: Not Currently    Comment: 44 oz daily  . Drug use: No     Allergies   Hctz [hydrochlorothiazide]   Review of Systems Review of Systems  Constitutional: Negative for chills and fever.  HENT: Negative for ear pain and sore throat.   Eyes: Negative for pain and visual disturbance.  Respiratory: Negative for cough and shortness of breath.   Cardiovascular: Negative for chest pain, palpitations and leg swelling.   Gastrointestinal: Negative for abdominal pain and vomiting.  Genitourinary: Negative for dysuria and hematuria.  Musculoskeletal: Negative for arthralgias and back pain.  Skin: Negative for color change and rash.  Neurological: Negative for dizziness, seizures, syncope, facial asymmetry, speech difficulty, weakness, light-headedness, numbness and headaches.  All other systems reviewed and are negative.    Physical Exam Triage Vital Signs ED Triage Vitals  Enc Vitals Group     BP 05/17/19 1612 131/83     Pulse Rate 05/17/19 1612 68     Resp 05/17/19 1612 16     Temp 05/17/19 1612 98.2 F (36.8 C)     Temp Source 05/17/19 1612 Temporal     SpO2 05/17/19 1612 96 %     Weight --      Height --      Head Circumference --      Peak Flow --      Pain Score 05/17/19 1610 0     Pain Loc --      Pain Edu? --      Excl. in [place]? --    No data found.  Updated Vital Signs BP 131/83 (BP Location: Left Arm)   Pulse 68   Temp 98.2 F (36.8 C) (Temporal)   Resp 16   SpO2 96%   Visual Acuity Right Eye Distance:   Left Eye Distance:   Bilateral Distance:    Right Eye Near:   Left Eye Near:    Bilateral Near:     Physical Exam Vitals signs and nursing note reviewed.  Constitutional:      Appearance: He is well-developed.  HENT:     Head: Normocephalic and atraumatic.     Right Ear: Tympanic membrane normal.     Left Ear: Tympanic membrane normal.     Mouth/Throat:     Mouth: Mucous membranes are moist.  Eyes:     Conjunctiva/sclera: Conjunctivae normal.  Neck:     Musculoskeletal: Neck supple.  Cardiovascular:     Rate and Rhythm: Normal rate and regular rhythm.     Heart sounds: Normal heart sounds.  Pulmonary:     Effort: Pulmonary effort is normal. No respiratory distress.     Breath sounds: Normal breath sounds.  Abdominal:     Palpations: Abdomen is soft.     Tenderness: There is no abdominal tenderness.  Skin:    General: Skin is warm and dry.  Neurological:      General: No focal deficit present.     Mental Status: He is alert and oriented to person, place, and time.     Sensory: No sensory deficit.     Motor: No weakness.     Coordination: Coordination normal.     Gait: Gait normal.     Deep Tendon Reflexes: Reflexes normal.      UC Treatments / Results  Labs (all labs ordered are listed, but only abnormal results  are displayed) Labs Reviewed - No data to display  EKG   Radiology No results found.  Procedures Procedures (including critical care time)  Medications Ordered in UC Medications - No data to display  Initial Impression / Assessment and Plan / UC Course  I have reviewed the triage vital signs and the nursing notes.  Pertinent labs & imaging results that were available during my care of the patient were reviewed by me and considered in my medical decision making (see chart for details).   Shortness of breath with exertion; none currently.  EKG unchanged from previous in 2018.  Follow-up as scheduled with his primary care provider on 05/19/2019.  Strict instructions for calling 911 if he develops dizziness, chest pain, shortness of breath, palpitations, nausea, diaphoresis, other concerning symptoms.     Final Clinical Impressions(s) / UC Diagnoses   Final diagnoses:  None   Discharge Instructions   None    ED Prescriptions    None     Controlled Substance Prescriptions [place] Controlled Substance Registry consulted? Not Applicable   Sharion Balloon, NP 05/17/19 1728

## 2019-09-17 ENCOUNTER — Other Ambulatory Visit: Payer: Self-pay | Admitting: Family Medicine

## 2019-09-17 DIAGNOSIS — I10 Essential (primary) hypertension: Secondary | ICD-10-CM

## 2019-09-21 ENCOUNTER — Other Ambulatory Visit: Payer: Self-pay | Admitting: Family Medicine

## 2019-09-21 DIAGNOSIS — I10 Essential (primary) hypertension: Secondary | ICD-10-CM

## 2019-09-23 ENCOUNTER — Telehealth: Payer: Self-pay | Admitting: Family Medicine

## 2019-09-23 DIAGNOSIS — I10 Essential (primary) hypertension: Secondary | ICD-10-CM

## 2019-09-23 MED ORDER — LOSARTAN POTASSIUM 50 MG PO TABS: 50.0000 mg | ORAL_TABLET | Freq: Every day | ORAL | 0 refills | Status: DC

## 2019-09-23 NOTE — Telephone Encounter (Signed)
1) Medication(s) Requested (by name):losartan (COZAAR) 50 MG tablet LC:[id number]    2) [facility] [place] - [id number]   3) Special Requests: Please fill until 12/10  Approved medications will be sent to the pharmacy, we will reach out if there is an issue.  Requests made after 3pm may not be addressed until the following business day!  If a patient is unsure of the name of the medication(s) please note and ask patient to call back when they are able to provide all info, do not send to responsible party until all information is available!

## 2019-09-23 NOTE — Telephone Encounter (Signed)
Refills sent for 1 month.

## 2019-10-14 ENCOUNTER — Other Ambulatory Visit: Payer: Self-pay

## 2019-10-14 ENCOUNTER — Encounter: Payer: Self-pay | Admitting: Family Medicine

## 2019-10-14 ENCOUNTER — Ambulatory Visit: Payer: Medicaid Other | Attending: Family Medicine | Admitting: Family Medicine

## 2019-10-14 VITALS — BP 113/57 | HR 68 | Temp 98.5°F | Resp 18 | Ht 70.5 in | Wt 163.0 lb

## 2019-10-14 DIAGNOSIS — R7303 Prediabetes: Secondary | ICD-10-CM | POA: Diagnosis not present

## 2019-10-14 DIAGNOSIS — I10 Essential (primary) hypertension: Secondary | ICD-10-CM | POA: Diagnosis present

## 2019-10-14 DIAGNOSIS — Z7984 Long term (current) use of oral hypoglycemic drugs: Secondary | ICD-10-CM | POA: Diagnosis not present

## 2019-10-14 DIAGNOSIS — Z125 Encounter for screening for malignant neoplasm of prostate: Secondary | ICD-10-CM

## 2019-10-14 DIAGNOSIS — D500 Iron deficiency anemia secondary to blood loss (chronic): Secondary | ICD-10-CM | POA: Diagnosis not present

## 2019-10-14 DIAGNOSIS — Z8249 Family history of ischemic heart disease and other diseases of the circulatory system: Secondary | ICD-10-CM | POA: Insufficient documentation

## 2019-10-14 DIAGNOSIS — Z79899 Other long term (current) drug therapy: Secondary | ICD-10-CM | POA: Diagnosis not present

## 2019-10-14 DIAGNOSIS — R5383 Other fatigue: Secondary | ICD-10-CM | POA: Diagnosis not present

## 2019-10-14 DIAGNOSIS — R350 Frequency of micturition: Secondary | ICD-10-CM

## 2019-10-14 DIAGNOSIS — Z1159 Encounter for screening for other viral diseases: Secondary | ICD-10-CM

## 2019-10-14 MED ORDER — FERROUS SULFATE 325 (65 FE) MG PO TABS: ORAL_TABLET | ORAL | 1 refills | Status: DC

## 2019-10-14 MED ORDER — LOSARTAN POTASSIUM 50 MG PO TABS: 50.0000 mg | ORAL_TABLET | Freq: Every day | ORAL | 1 refills | Status: DC

## 2019-10-14 MED ORDER — METFORMIN HCL 500 MG PO TABS: ORAL_TABLET | ORAL | 1 refills | Status: DC

## 2019-10-14 NOTE — Patient Instructions (Signed)

## 2019-10-14 NOTE — Progress Notes (Signed)
Established Patient Office Visit  Subjective:  Patient ID: Joe LUNNEY Sr., male    DOB: 1961/11/02  Age: 58 y.o. MRN: [id number]  CC:  Chief Complaint  Patient presents with  . Hypertension    HPI Joe GUNBY Sr. , 58 year old African-American male, last seen in the office 04/14/2019 in follow-up of hypertension and prediabetes along with history of anemia due to GI blood loss as well as pancreatic mass seen on CT in January of this year during hospitalization which is being followed by gastroenterology who returns for follow-up.  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  He additionally has history of chronic low back pain.  His hemoglobin A1c from 03/31/2019 was 5.7 with glucose of 111 on BMP done 04/23/2019 and last hemoglobin was 12.7 on 04/23/2019.        At today's visit, he reports that he has been compliant with medication.  No headache or dizziness related to his blood pressure.  He reports that he has followed up with GI regarding possible pancreatic mass at hospitalization earlier this year and has had follow-up with interventional radiology.  He denies any current abdominal pain and states that he has not had any recent back pain.  He denies any nausea/vomiting/diarrhea or constipation.  He reports no blood in the stool and no black stools.  He believes that he has been taking the iron supplement but would need a refill.  He also needs refill of blood pressure medication.  He does not require refill of Zofran as he is having no nausea at this time.  He reports that he is not currently on any acid reflux type medication and has had no burping/belching or backwash of fluid into the throat.  Patient was questioned regarding appearing fatigued at today's visit and he denies any excessive fatigue.  He has had some fatigue but reports that he works late at night and did not get much sleep prior to today's visit.  Overall he feels  that he is stable at this time.       He has made changes in his diet since his last visit but not consistently.  He is taking Glucophage for prediabetes but denies any issues with diarrhea or stomach upset related to use of Glucophage.  He denies any increased thirst but states that he does have issues with urinary frequency but he thinks that this may be related to his blood pressure medicine.  Patient also has started to increase his water intake.  He denies dysuria.  He does not have to get up on a regular basis at night in order to urinate but if he drinks later at night he may need to get up once or twice per night to urinate.  Past Medical History:  Diagnosis Date  . Hypertension 2016  . Mass of pancreas 03/11/2019  . Microcytic anemia   . 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 COLONOSCOPY WITH PROPOFOL N/A 11/08/2018   Procedure: COLONOSCOPY WITH PROPOFOL;  Surgeon: Lavena Bullion, DO;  Location: [facility];  Service: Gastroenterology;  Laterality: N/A;  . ESOPHAGOGASTRODUODENOSCOPY (EGD) WITH PROPOFOL N/A 11/06/2018   Procedure: ESOPHAGOGASTRODUODENOSCOPY (EGD) WITH PROPOFOL;  Surgeon: Carol Ada, MD;  Location: [facility];  Service: Endoscopy;  Laterality: N/A;  . EUS  11/06/2018   Procedure: FULL UPPER ENDOSCOPIC ULTRASOUND (EUS) RADIAL;  Surgeon: Carol Ada, MD;  Location: [facility];  Service: Endoscopy;;  . GIVENS CAPSULE STUDY N/A 11/08/2018   Procedure: GIVENS CAPSULE STUDY;  Surgeon: Lavena Bullion, DO;  Location: [facility];  Service: Gastroenterology;  Laterality: N/A;  . IR ANGIOGRAM SELECTIVE EACH ADDITIONAL VESSEL  11/10/2018  . IR ANGIOGRAM VISCERAL SELECTIVE  11/10/2018  . IR ANGIOGRAM VISCERAL SELECTIVE  11/10/2018  . IR EMBO ART  VEN HEMORR LYMPH EXTRAV  INC GUIDE ROADMAPPING  11/10/2018  . IR US GUIDE VASC ACCESS RIGHT  11/10/2018  . KNEE SURGERY Bilateral 1985    Arthroscopic for torn cartilage and ligaments.      Family History  Problem Relation Age of Onset  . Cancer Mother        uncertain what was wrong--mother would not share, but gradual decline.  . Hypertension Mother   . Hypertension Father     Social History   Socioeconomic History  . Marital status: Soil scientist    Spouse name: Joe [name]  . Number of children: 2  . Years of education: 65  . Highest education level: 12th grade  Occupational History  . Occupation: Packing/stacking/loading    Comment: soaps, shaving implements, etc.  Tobacco Use  . Smoking status: Never Smoker  . Smokeless tobacco: Never Used  Substance and Sexual Activity  . Alcohol use: Not Currently    Comment: 44 oz daily  . Drug use: No  . Sexual activity: Yes  Other Topics Concern  . Not on file  Social History Narrative   Works second shift at [employer] with his partner of 30+ years and 61 yo daughter, Joe [name].      Social Determinants of Health   Financial Resource Strain:   . Difficulty of Paying Living Expenses: Not on file  Food Insecurity:   . Worried About Charity fundraiser in the Last Year: Not on file  . Ran Out of Food in the Last Year: Not on file  Transportation Needs:   . Lack of Transportation (Medical): Not on file  . Lack of Transportation (Non-Medical): Not on file  Physical Activity:   . Days of Exercise per Week: Not on file  . Minutes of Exercise per Session: Not on file  Stress:   . Feeling of Stress : Not on file  Social Connections:   . Frequency of Communication with Friends and Family: Not on file  . Frequency of Social Gatherings with Friends and Family: Not on file  . Attends Religious Services: Not on file  . Active Member of Clubs or Organizations: Not on file  . Attends Archivist Meetings: Not on file  . Marital Status: Not on file  Intimate Partner Violence:   . Fear of Current or Ex-Partner: Not on file  . Emotionally  Abused: Not on file  . Physically Abused: Not on file  . Sexually Abused: Not on file    Outpatient Medications Prior to Visit  Medication Sig Dispense Refill  . Blood Pressure Monitor KIT Used to monitor blood pressure daily and as directed 1 each 0  . ondansetron (ZOFRAN) 4 MG tablet Take 1 tablet (4 mg total) by mouth every 6 (six) hours as needed for nausea. 20 tablet 0  . ferrous sulfate 325 (65 FE) MG tablet 1 tablet once per day after eating for anemia treatment 30 tablet 3  . losartan (COZAAR) 50 MG tablet Take 1 tablet (50 mg total) by mouth daily. To lower blood pressure 30 tablet 0  .  metFORMIN (GLUCOPHAGE) 500 MG tablet 1 pill daily after the evening meal to help with blood sugar control 30 tablet 3   No facility-administered medications prior to visit.    Allergies  Allergen Reactions  . Hctz [Hydrochlorothiazide] Rash    ROS Review of Systems  Constitutional: Positive for fatigue (Patient attributes to her work schedule). Negative for chills and fever.  HENT: Negative for sore throat and trouble swallowing.   Eyes: Negative for photophobia and visual disturbance.  Respiratory: Negative for cough and shortness of breath.   Cardiovascular: Negative for chest pain, palpitations and leg swelling.  Gastrointestinal: Negative for abdominal pain, blood in stool, constipation, diarrhea and nausea.  Endocrine: Positive for polyuria. Negative for cold intolerance, heat intolerance, polydipsia and polyphagia.  Genitourinary: Positive for frequency. Negative for dysuria.  Musculoskeletal: Negative for arthralgias and back pain.  Skin: Negative for rash and wound.  Neurological: Negative for dizziness and headaches.  Hematological: Negative for adenopathy. Does not bruise/bleed easily.  Psychiatric/Behavioral: Negative for self-injury.      Objective:    Physical Exam  Constitutional: He is oriented to person, place, and time. He appears well-developed and well-nourished.   Well-nourished well-developed older male in no acute distress sitting on exam table.  Patient is wearing mask as per office COVID-19 protocol.  Patient does appear to be fatigued and patient was slightly exophthalmic appearance to the eyes  Eyes: Conjunctivae and EOM are normal. No scleral icterus.  Neck: No JVD present. No thyromegaly present.  Cardiovascular: Normal rate and regular rhythm.  Murmur (Cannot exclude soft murmur) heard. Pulmonary/Chest: Effort normal and breath sounds normal.  Abdominal: Soft. There is no abdominal tenderness. There is no rebound and no guarding.  Musculoskeletal:        General: No tenderness or edema. Normal range of motion.     Cervical back: Normal range of motion and neck supple.     Comments: No CVA tenderness  Lymphadenopathy:    He has no cervical adenopathy.  Neurological: He is alert and oriented to person, place, and time.  Skin: Skin is warm and dry.  Psychiatric: He has a normal mood and affect. His behavior is normal.  Nursing note and vitals reviewed.   BP (!) 113/57 (BP Location: Left Arm, Patient Position: Sitting, Cuff Size: Normal)   Pulse 68   Temp 98.5 F (36.9 C) (Oral)   Resp 18   Ht 5' 10.5" (1.791 m)   Wt 163 lb (73.9 kg)   SpO2 100%   BMI 23.06 kg/m  Wt Readings from Last 3 Encounters:  10/16/19 163 lb (73.9 kg)  10/14/19 163 lb (73.9 kg)  03/31/19 166 lb 3.2 oz (75.4 kg)     There are no preventive care reminders to display for this patient. He agrees to have screening for hepatitis C at today's visit.  He declines influenza immunization.   Lab Results  Component Value Date   TSH 1.060 10/14/2019   Lab Results  Component Value Date   WBC 2.7 (L) 10/16/2019   HGB 5.5 (LL) 10/16/2019   HCT 21.1 (L) 10/16/2019   MCV 63.0 (L) 10/16/2019   PLT 270 10/16/2019   Lab Results  Component Value Date   NA 138 10/16/2019   K 4.1 10/16/2019   CO2 22 10/16/2019   GLUCOSE 95 10/16/2019   BUN 10 10/16/2019    CREATININE 0.83 10/16/2019   BILITOT 0.8 10/16/2019   ALKPHOS 72 10/16/2019   AST 21 10/16/2019   ALT 19  10/16/2019   PROT 5.8 (L) 10/16/2019   ALBUMIN 3.4 (L) 10/16/2019   CALCIUM 8.5 (L) 10/16/2019   ANIONGAP 7 10/16/2019   GFR 99.85 03/11/2019   Lab Results  Component Value Date   CHOL 81 (L) 07/08/2018   Lab Results  Component Value Date   HDL 46 07/08/2018   Lab Results  Component Value Date   LDLCALC 27 07/08/2018   Lab Results  Component Value Date   TRIG 42 07/08/2018   No results found for: [facility] Lab Results  Component Value Date   HGBA1C 5.3 10/14/2019      Assessment & Plan:  1. Essential hypertension Blood pressure is controlled on current medication and he will continue use of losartan as well as low-sodium diet. - losartan (COZAAR) 50 MG tablet; Take 1 tablet (50 mg total) by mouth daily. To lower blood pressure  Dispense: 90 tablet; Refill: 1 - POCT URINALYSIS DIP (CLINITEK)  2. 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  He has been taking daily iron supplement which is refilled at today's visit and patient will also have CBC.  Patient did appear to be fatigued at today's visit but reports that this is secondary to decreased sleep after getting off of work and late hour prior to today's visit.  Patient is made aware that if he does have any blood in the stool or black stools that he does need to seek further medical attention due to his history of prior GI bleed. - CBC - ferrous sulfate 325 (65 FE) MG tablet; 1 tablet once per day after eating for anemia treatment  Dispense: 90 tablet; Refill: 1  3. Prediabetes Hemoglobin A1c on 03/31/2019 was just above normal at 5.7 and patient has been on Glucophage.  He will have repeat A1c at today's visit as well as repeat glucose as part of comprehensive metabolic panel.  Continue low carbohydrate diet and maintain a healthy weight. - Hemoglobin A1c - metFORMIN (GLUCOPHAGE)  500 MG tablet; 1 pill daily after the evening meal to help with blood sugar control  Dispense: 90 tablet; Refill: 1  4. Fatigue, unspecified type Patient denies any significant fatigue but did appear to be fatigued at today's visit which he attributes to his work hours and being sleepy at today's visit.  He does endorse occasional mild fatigue and has prior history of GI bleed with anemia.  Patient also was slightly exophthalmic appearance at today's visit.  Patient will have CBC, T4 and TSH, comprehensive metabolic panel and PSA at today's visit in follow-up of his complaint of fatigue as well as patient with some urinary frequency and patient is at increased risk of prostate cancer due to ethnicity. - CBC - T4 AND TSH - Comprehensive metabolic panel - PSA  5. Encounter for long-term current use of medication Patient will have comprehensive metabolic panel and urinalysis and follow-up of long-term use of medications. - POCT URINALYSIS DIP (CLINITEK)  6. Encounter for hepatitis C screening test for low risk patient Due to patient's decade of birth, hepatitis C screening is recommended and patient agrees to have this done at today's visit. - Hepatitis C Antibody  7. Urinary frequency; 8. Screening for prostate cancer Patient with complaint of urinary frequency for which urinalysis will be done to look for urinary tract infection.  He will also have hemoglobin A1c in follow-up of his prediabetes and glucose and electrolytes will be checked as part of comprehensive metabolic panel.  Due to patient's age and ethnicity he will also have PSA as a screening test for prostate cancer and this was discussed with the patient and he agreed to have screening test done.  Patient reports that he believes urinary frequency may be related to his blood pressure medicine however he is not currently on a diuretic type blood pressure medication. - POCT URINALYSIS DIP (CLINITEK) - PSA   An After Visit Summary was  printed and given to the patient.   Follow-up: Return in about 6 months (around 04/13/2020) for chronic issues but sooner if needed for any concerns.   Antony Blackbird, MD

## 2019-10-15 ENCOUNTER — Telehealth: Payer: Self-pay | Admitting: Family Medicine

## 2019-10-15 LAB — COMPREHENSIVE METABOLIC PANEL WITH GFR
ALT: 16 IU/L (ref 0–44)
AST: 22 IU/L (ref 0–40)
Albumin/Globulin Ratio: 2 (ref 1.2–2.2)
Albumin: 4.1 g/dL (ref 3.8–4.9)
Alkaline Phosphatase: 89 IU/L (ref 39–117)
BUN/Creatinine Ratio: 14 (ref 9–20)
BUN: 12 mg/dL (ref 6–24)
Bilirubin Total: 0.7 mg/dL (ref 0.0–1.2)
CO2: 21 mmol/L (ref 20–29)
Calcium: 8.6 mg/dL — ABNORMAL LOW (ref 8.7–10.2)
Chloride: 107 mmol/L — ABNORMAL HIGH (ref 96–106)
Creatinine, Ser: 0.86 mg/dL (ref 0.76–1.27)
GFR calc Af Amer: 110 mL/min/1.73
GFR calc non Af Amer: 96 mL/min/1.73
Globulin, Total: 2.1 g/dL (ref 1.5–4.5)
Glucose: 115 mg/dL — ABNORMAL HIGH (ref 65–99)
Potassium: 4.3 mmol/L (ref 3.5–5.2)
Sodium: 140 mmol/L (ref 134–144)
Total Protein: 6.2 g/dL (ref 6.0–8.5)

## 2019-10-15 LAB — CBC
Hematocrit: 21.9 % — ABNORMAL LOW (ref 37.5–51.0)
Hemoglobin: 5.7 g/dL — CL (ref 13.0–17.7)
MCH: 16.5 pg — ABNORMAL LOW (ref 26.6–33.0)
MCHC: 26 g/dL — ABNORMAL LOW (ref 31.5–35.7)
MCV: 63 fL — ABNORMAL LOW (ref 79–97)
Platelets: 311 x10E3/uL (ref 150–450)
RBC: 3.46 x10E6/uL — ABNORMAL LOW (ref 4.14–5.80)
RDW: 19.6 % — ABNORMAL HIGH (ref 11.6–15.4)
WBC: 3.9 x10E3/uL (ref 3.4–10.8)

## 2019-10-15 LAB — T4 AND TSH
T4, Total: 6.7 ug/dL (ref 4.5–12.0)
TSH: 1.06 u[IU]/mL (ref 0.450–4.500)

## 2019-10-15 LAB — PSA: Prostate Specific Ag, Serum: 1.5 ng/mL (ref 0.0–4.0)

## 2019-10-15 LAB — HEMOGLOBIN A1C
Est. average glucose Bld gHb Est-mCnc: 105 mg/dL
Hgb A1c MFr Bld: 5.3 % (ref 4.8–5.6)

## 2019-10-15 LAB — HEPATITIS C ANTIBODY: Hep C Virus Ab: <0.1 {s_co_ratio} (ref 0.0–0.9)

## 2019-10-15 NOTE — Progress Notes (Signed)
Patient with critically abnormal hemoglobin and had been contacted overnight by on-call provider however did not follow-up at ED and instead went to work. Message left on his voicemail to go to ED for further follow-up of abnormal labs.

## 2019-10-15 NOTE — Telephone Encounter (Signed)
Called placed to patient's home number as patient with critical hemoglobin of 5.7 and per wife, patient at work and she provided cell phone number but stated that patient had received phone call this am at 2:30 and she awakened him to answer the phone then he went back to sleep after talking with the caller but said that he called about abnormal blood work but did not seem concerned. Wife was told that patient needs to be seen at the ED in follow-up of his abnormal blood work. I placed call to his cell phone number but got answering machine and left message that it was important for patient to go to the hospital in follow-up of his abnormal labs results that he was previously contacted about overnight.

## 2019-10-16 ENCOUNTER — Encounter (HOSPITAL_COMMUNITY): Payer: Self-pay | Admitting: Emergency Medicine

## 2019-10-16 ENCOUNTER — Other Ambulatory Visit: Payer: Self-pay

## 2019-10-16 ENCOUNTER — Emergency Department (HOSPITAL_COMMUNITY)
Admission: EM | Admit: 2019-10-16 | Discharge: 2019-10-20 | Payer: Medicaid Other | Attending: Emergency Medicine | Admitting: Emergency Medicine

## 2019-10-16 DIAGNOSIS — R7989 Other specified abnormal findings of blood chemistry: Secondary | ICD-10-CM | POA: Diagnosis present

## 2019-10-16 DIAGNOSIS — D649 Anemia, unspecified: Secondary | ICD-10-CM

## 2019-10-16 DIAGNOSIS — Z79899 Other long term (current) drug therapy: Secondary | ICD-10-CM | POA: Diagnosis not present

## 2019-10-16 DIAGNOSIS — I10 Essential (primary) hypertension: Secondary | ICD-10-CM | POA: Insufficient documentation

## 2019-10-16 DIAGNOSIS — D508 Other iron deficiency anemias: Secondary | ICD-10-CM | POA: Insufficient documentation

## 2019-10-16 DIAGNOSIS — D509 Iron deficiency anemia, unspecified: Secondary | ICD-10-CM

## 2019-10-16 LAB — IRON AND TIBC
Iron: 9 ug/dL — ABNORMAL LOW (ref 45–182)
Saturation Ratios: 2 % — ABNORMAL LOW (ref 17.9–39.5)
TIBC: 522 ug/dL — ABNORMAL HIGH (ref 250–450)
UIBC: 513 ug/dL

## 2019-10-16 LAB — COMPREHENSIVE METABOLIC PANEL
ALT: 19 U/L (ref 0–44)
AST: 21 U/L (ref 15–41)
Albumin: 3.4 g/dL — ABNORMAL LOW (ref 3.5–5.0)
Alkaline Phosphatase: 72 U/L (ref 38–126)
Anion gap: 7 (ref 5–15)
BUN: 10 mg/dL (ref 6–20)
CO2: 22 mmol/L (ref 22–32)
Calcium: 8.5 mg/dL — ABNORMAL LOW (ref 8.9–10.3)
Chloride: 109 mmol/L (ref 98–111)
Creatinine, Ser: 0.83 mg/dL (ref 0.61–1.24)
GFR calc Af Amer: >60 mL/min (ref >60)
GFR calc non Af Amer: >60 mL/min (ref >60)
Glucose, Bld: 95 mg/dL (ref 70–99)
Potassium: 4.1 mmol/L (ref 3.5–5.1)
Sodium: 138 mmol/L (ref 135–145)
Total Bilirubin: 0.8 mg/dL (ref 0.3–1.2)
Total Protein: 5.8 g/dL — ABNORMAL LOW (ref 6.5–8.1)

## 2019-10-16 LAB — CBC
HCT: 21.1 % — ABNORMAL LOW (ref 39.0–52.0)
Hemoglobin: 5.5 g/dL — CL (ref 13.0–17.0)
MCH: 16.4 pg — ABNORMAL LOW (ref 26.0–34.0)
MCHC: 26.1 g/dL — ABNORMAL LOW (ref 30.0–36.0)
MCV: 63 fL — ABNORMAL LOW (ref 80.0–100.0)
Platelets: 270 10*3/uL (ref 150–400)
RBC: 3.35 MIL/uL — ABNORMAL LOW (ref 4.22–5.81)
RDW: 21 % — ABNORMAL HIGH (ref 11.5–15.5)
WBC: 2.7 10*3/uL — ABNORMAL LOW (ref 4.0–10.5)
nRBC: 0 % (ref 0.0–0.2)

## 2019-10-16 LAB — RETICULOCYTES
Immature Retic Fract: 32 % — ABNORMAL HIGH (ref 2.3–15.9)
RBC.: 3.52 MIL/uL — ABNORMAL LOW (ref 4.22–5.81)
Retic Count, Absolute: 36.6 10*3/uL (ref 19.0–186.0)
Retic Ct Pct: 1 % (ref 0.4–3.1)

## 2019-10-16 LAB — FOLATE: Folate: 18.3 ng/mL (ref >5.9)

## 2019-10-16 LAB — VITAMIN B12: Vitamin B-12: 279 pg/mL (ref 180–914)

## 2019-10-16 LAB — PREPARE RBC (CROSSMATCH)

## 2019-10-16 LAB — FERRITIN: Ferritin: 5 ng/mL — ABNORMAL LOW (ref 24–336)

## 2019-10-16 LAB — POC OCCULT BLOOD, ED: Fecal Occult Bld: NEGATIVE

## 2019-10-16 MED ORDER — SODIUM CHLORIDE 0.9 % IV SOLN
10.0000 mL/h | Freq: Once | INTRAVENOUS | Status: AC
  Administered 2019-10-16: 11:00:00 10 mL/h via INTRAVENOUS

## 2019-10-16 MED ORDER — FERROUS SULFATE 325 (65 FE) MG PO TABS: 325.0000 mg | ORAL_TABLET | Freq: Every day | ORAL | 0 refills | Status: DC

## 2019-10-16 NOTE — ED Notes (Addendum)
Began transfusion of first unit at 1059

## 2019-10-16 NOTE — ED Provider Notes (Signed)
[facility] EMERGENCY DEPARTMENT Provider Note   CSN: [id number] Arrival date & time: 10/16/19  8101     History Chief Complaint  Patient presents with  . abnormal labs    Joe [name] Sr. is a 58 y.o. male.  Patient c/o being told by his doctor to come to ED as high blood count is low. Symptoms gradual onset, moderate, persistent, worse this week. Patient with hx anemia, and prior transfusion, although no transfusion in past several months. Denies acute blood loss, no melena or other bleeding. Has been on iron therapy, but has not been taking any iron. Mild general weakness and fatigue. No syncope. No chest pain or discomfort. No sob. No abd pain or nvd.   The history is provided by the patient.       Past Medical History:  Diagnosis Date  . Hypertension 2016  . Mass of pancreas 03/11/2019  . Microcytic anemia   . 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 Mass of pancreas 03/11/2019  . Microcytic anemia   . 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 Hypertension 11/04/2014    Past Surgical History:  Procedure Laterality Date  . COLONOSCOPY WITH PROPOFOL N/A 11/08/2018   Procedure: COLONOSCOPY WITH PROPOFOL;  Surgeon: Lavena Bullion, DO;  Location: [facility];  Service: Gastroenterology;  Laterality: N/A;  . ESOPHAGOGASTRODUODENOSCOPY (EGD) WITH PROPOFOL N/A 11/06/2018   Procedure: ESOPHAGOGASTRODUODENOSCOPY (EGD) WITH PROPOFOL;  Surgeon: Carol Ada, MD;  Location: [facility];  Service: Endoscopy;  Laterality: N/A;  . EUS  11/06/2018   Procedure: FULL UPPER ENDOSCOPIC ULTRASOUND (EUS) RADIAL;  Surgeon: Carol Ada, MD;  Location: [facility];  Service: Endoscopy;;  . GIVENS CAPSULE STUDY N/A 11/08/2018   Procedure: GIVENS CAPSULE STUDY;  Surgeon:  Lavena Bullion, DO;  Location: [facility];  Service: Gastroenterology;  Laterality: N/A;  . IR ANGIOGRAM SELECTIVE EACH ADDITIONAL VESSEL  11/10/2018  . IR ANGIOGRAM VISCERAL SELECTIVE  11/10/2018  . IR ANGIOGRAM VISCERAL SELECTIVE  11/10/2018  . IR EMBO ART  VEN HEMORR LYMPH EXTRAV  INC GUIDE ROADMAPPING  11/10/2018  . IR US GUIDE VASC ACCESS RIGHT  11/10/2018  . KNEE SURGERY Bilateral 1985   Arthroscopic for torn cartilage and ligaments.         Family History  Problem Relation Age of Onset  . Cancer Mother        uncertain what was wrong--mother would not share, but gradual decline.  . Hypertension Mother   . Hypertension Father     Social History   Tobacco Use  . Smoking status: Never Smoker  . Smokeless tobacco: Never Used  Substance Use Topics  . Alcohol use: Not Currently    Comment: 44 oz daily  . Drug use: No    Home Medications Prior to Admission medications   Medication Sig Start Date End Date Taking? Authorizing Provider  Blood Pressure Monitor KIT Used to monitor blood pressure daily and as directed 04/14/19   Fulp, Cammie, MD  ferrous sulfate 325 (65 FE) MG tablet 1 tablet once per day after eating for anemia treatment 10/14/19   Fulp, Cammie, MD  losartan (COZAAR) 50 MG tablet Take 1 tablet (50 mg total) by mouth daily. To lower blood pressure 10/14/19   Fulp, Cammie, MD  metFORMIN (GLUCOPHAGE) 500 MG tablet 1 pill daily after  the evening meal to help with blood sugar control 10/14/19   Fulp, Cammie, MD  ondansetron (ZOFRAN) 4 MG tablet Take 1 tablet (4 mg total) by mouth every 6 (six) hours as needed for nausea. 11/14/18   Debbe Odea, MD    Allergies    Hctz [hydrochlorothiazide]  Review of Systems   Review of Systems  Constitutional: Negative for fever.  HENT: Negative for nosebleeds.   Eyes: Negative for redness.  Respiratory: Negative for cough and shortness of breath.   Cardiovascular: Negative for chest pain.  Gastrointestinal: Negative for  abdominal pain, blood in stool and vomiting.  Genitourinary: Negative for flank pain and hematuria.  Musculoskeletal: Negative for back pain and neck pain.  Skin: Negative for rash.  Neurological: Negative for headaches.  Hematological: Does not bruise/bleed easily.  Psychiatric/Behavioral: Negative for confusion.    Physical Exam Updated Vital Signs BP (!) 117/59 (BP Location: Right Arm)   Pulse 70   Temp 98 F (36.7 C) (Oral)   Resp 17   SpO2 100%   Physical Exam Vitals and nursing note reviewed.  Constitutional:      Appearance: Normal appearance. He is well-developed.  HENT:     Head: Atraumatic.     Nose: Nose normal.     Mouth/Throat:     Mouth: Mucous membranes are moist.     Pharynx: Oropharynx is clear.  Eyes:     General: No scleral icterus.    Comments: Conj pale.   Neck:     Trachea: No tracheal deviation.  Cardiovascular:     Rate and Rhythm: Normal rate and regular rhythm.     Pulses: Normal pulses.     Heart sounds: Normal heart sounds. No murmur. No friction rub. No gallop.   Pulmonary:     Effort: Pulmonary effort is normal. No accessory muscle usage or respiratory distress.     Breath sounds: Normal breath sounds.  Abdominal:     General: Bowel sounds are normal. There is no distension.     Palpations: Abdomen is soft.     Tenderness: There is no abdominal tenderness. There is no guarding.  Genitourinary:    Comments: No cva tenderness. Light brown stool, heme neg.  Musculoskeletal:        General: No swelling.     Cervical back: Normal range of motion and neck supple. No rigidity.  Skin:    General: Skin is warm and dry.     Coloration: Skin is pale.     Findings: No rash.  Neurological:     Mental Status: He is alert.     Comments: Alert, speech clear.   Psychiatric:        Mood and Affect: Mood normal.     ED Results / Procedures / Treatments   Labs (all labs ordered are listed, but only abnormal results are displayed) Results for  orders placed or performed during the hospital encounter of 10/16/19  CBC  Result Value Ref Range   WBC 2.7 (L) 4.0 - 10.5 K/uL   RBC 3.35 (L) 4.22 - 5.81 MIL/uL   Hemoglobin 5.5 (LL) 13.0 - 17.0 g/dL   HCT 21.1 (L) 39.0 - 52.0 %   MCV 63.0 (L) 80.0 - 100.0 fL   MCH 16.4 (L) 26.0 - 34.0 pg   MCHC 26.1 (L) 30.0 - 36.0 g/dL   RDW 21.0 (H) 11.5 - 15.5 %   Platelets 270 150 - 400 K/uL   nRBC 0.0 0.0 - 0.2 %  Comprehensive metabolic panel  Result Value Ref Range   Sodium 138 135 - 145 mmol/L   Potassium 4.1 3.5 - 5.1 mmol/L   Chloride 109 98 - 111 mmol/L   CO2 22 22 - 32 mmol/L   Glucose, Bld 95 70 - 99 mg/dL   BUN 10 6 - 20 mg/dL   Creatinine, Ser 0.83 0.61 - 1.24 mg/dL   Calcium 8.5 (L) 8.9 - 10.3 mg/dL   Total Protein 5.8 (L) 6.5 - 8.1 g/dL   Albumin 3.4 (L) 3.5 - 5.0 g/dL   AST 21 15 - 41 U/L   ALT 19 0 - 44 U/L   Alkaline Phosphatase 72 38 - 126 U/L   Total Bilirubin 0.8 0.3 - 1.2 mg/dL   GFR calc non Af Amer >60 >60 mL/min   GFR calc Af Amer >60 >60 mL/min   Anion gap 7 5 - 15  Vitamin B12  Result Value Ref Range   Vitamin B-12 279 180 - 914 pg/mL  Folate  Result Value Ref Range   Folate 18.3 >5.9 ng/mL  Iron and TIBC  Result Value Ref Range   Iron 9 (L) 45 - 182 ug/dL   TIBC 522 (H) 250 - 450 ug/dL   Saturation Ratios 2 (L) 17.9 - 39.5 %   UIBC 513 ug/dL  Ferritin  Result Value Ref Range   Ferritin 5 (L) 24 - 336 ng/mL  Reticulocytes  Result Value Ref Range   Retic Ct Pct 1.0 0.4 - 3.1 %   RBC. 3.52 (L) 4.22 - 5.81 MIL/uL   Retic Count, Absolute 36.6 19.0 - 186.0 K/uL   Immature Retic Fract 32.0 (H) 2.3 - 15.9 %  POC occult blood, ED Provider will collect  Result Value Ref Range   Fecal Occult Bld NEGATIVE NEGATIVE  Type and screen  Bend  Result Value Ref Range   ABO/RH(D) A POS    Antibody Screen NEG    Sample Expiration 10/19/2019,2359    Unit Number [id number]    Blood Component Type RED CELLS,LR    Unit division 00     Status of Unit ALLOCATED    Transfusion Status OK TO TRANSFUSE    Crossmatch Result Compatible    Unit Number [id number]    Blood Component Type RED CELLS,LR    Unit division 00    Status of Unit ISSUED    Transfusion Status OK TO TRANSFUSE    Crossmatch Result      Compatible Performed at [facility], [address] [place], [place] [address]   Prepare RBC  Result Value Ref Range   Order Confirmation      ORDER PROCESSED BY BLOOD BANK Performed at [facility], [place] [address], [place],  [address]   BPAM [facility]  Result Value Ref Range   Blood Product Unit Number [id number]    PRODUCT CODE N0272Z36    Unit Type and Rh 6200    Blood Product Expiration Date 644034742595    ISSUE DATE / TIME [id number]    Blood Product Unit Number [id number]    PRODUCT CODE E0382V00    Unit Type and Rh 0109    Blood Product Expiration Date 323557322025    EKG EKG Interpretation  Date/Time:  Saturday October 16 2019 09:36:02 EST Ventricular Rate:  61 PR Interval:    QRS Duration: 95 QT Interval:  413 QTC Calculation: 416 R Axis:   -21 Text Interpretation: Sinus rhythm Nonspecific T wave abnormality  No significant change since last tracing Confirmed by Lajean Saver [phone]) on 10/16/2019 9:44:05 AM   Radiology No results found.  Procedures Procedures (including critical care time)  Medications Ordered in ED Medications - No data to display  ED Course  I have reviewed the triage vital signs and the nursing notes.  Pertinent labs & imaging results that were available during my care of the patient were reviewed by me and considered in my medical decision making (see chart for details).    MDM Rules/Calculators/A&P  Iv ns. Continuous pulse ox and monitor.   Stat labs.  Reviewed nursing notes and prior charts for additional history.  Reviewed labs from yesterday, hgb very low then.   Labs reviewed/interpreted by me - hgb 5, pt symptomatic. Will  transfuse emergently.   Patient emergently transfused 2 units prbcs.   Recheck pt, no cp or sob. Afebrile. Is starting to feel improved.   Po fluids, food.   CRITICAL CARE RE: severe, symptomatic anemia, hemoglobin 5, requiring emergent transfusion Performed by: Mirna Mires Total critical care time: 45 minutes Critical care time was exclusive of separately billable procedures and treating other patients. Critical care was necessary to treat or prevent imminent or life-threatening deterioration. Critical care was time spent personally by me on the following activities: development of treatment plan with patient and/or surrogate as well as nursing, discussions with consultants, evaluation of patient's response to treatment, examination of patient, obtaining history from patient or surrogate, ordering and performing treatments and interventions, ordering and review of laboratory studies, ordering and review of radiographic studies, pulse oximetry and re-evaluation of patient's condition.  Will also placed back on fe therapy, will refer to close pcp f/u. ?whether pt would benefit from outpt iv iron therapy, in addition to need to monitor compliance w home meds.  Return precautions provided.   Recheck pt, asymptomatic, no faintness or dizziness. No weakness or sob.   Pt states feels at baseline and ready for d/c.   Stools is heme negative. Pt with significant hypochromic, microcytic anemia. Discussed importance iron therapy and close f/u.  Return precautions provided.     Final Clinical Impression(s) / ED Diagnoses Final diagnoses:  None    Rx / DC Orders ED Discharge Orders    None       Lajean Saver, MD 10/16/19 1555

## 2019-10-16 NOTE — Discharge Instructions (Signed)
It was our pleasure to provide your ER care today - we hope that you feel better.  Follow up with your doctor in the next 2-3 days for recheck - call office to arrange appointment.  Take iron supplement as prescribed. Eat balanced diet.   Return to ER right away if worse, new symptoms, fevers, weak/faint, chest pain, trouble breathing, or other concern.

## 2019-10-16 NOTE — ED Notes (Signed)
Pt verbalized understanding about receiving blood . Consent signed and is at bedside.

## 2019-10-16 NOTE — ED Notes (Signed)
Hooked patient to the monitor did ekg shown to Dr Ashok Cordia patient is resting with call bell in reach

## 2019-10-16 NOTE — ED Notes (Signed)
Dr. Ashok Cordia notified of pt and orders received.

## 2019-10-16 NOTE — ED Notes (Signed)
Provided pt with ginger ale with bagged lunch.

## 2019-10-16 NOTE — ED Triage Notes (Signed)
Pt states he received a call yesterday that his Hgb was low.  Pt denies any complaints and states he feels fine.

## 2019-10-17 LAB — TYPE AND SCREEN
ABO/RH(D): A POS
Antibody Screen: NEGATIVE
Unit division: 0
Unit division: 0

## 2019-10-17 LAB — BPAM RBC
Blood Product Expiration Date: 202101052359
Blood Product Expiration Date: 202101052359
ISSUE DATE / TIME: 202012121047
ISSUE DATE / TIME: 202012121329
Unit Type and Rh: 6200
Unit Type and Rh: 6200

## 2019-10-22 ENCOUNTER — Ambulatory Visit: Payer: Medicaid Other | Attending: Family Medicine | Admitting: Family Medicine

## 2019-10-22 ENCOUNTER — Other Ambulatory Visit: Payer: Self-pay

## 2019-10-22 ENCOUNTER — Encounter: Payer: Self-pay | Admitting: Family Medicine

## 2019-10-22 VITALS — BP 138/79 | HR 69 | Temp 97.9°F | Ht 70.0 in | Wt 167.8 lb

## 2019-10-22 DIAGNOSIS — Z09 Encounter for follow-up examination after completed treatment for conditions other than malignant neoplasm: Secondary | ICD-10-CM

## 2019-10-22 DIAGNOSIS — K869 Disease of pancreas, unspecified: Secondary | ICD-10-CM | POA: Diagnosis not present

## 2019-10-22 DIAGNOSIS — Z8249 Family history of ischemic heart disease and other diseases of the circulatory system: Secondary | ICD-10-CM | POA: Diagnosis not present

## 2019-10-22 DIAGNOSIS — D500 Iron deficiency anemia secondary to blood loss (chronic): Secondary | ICD-10-CM | POA: Diagnosis present

## 2019-10-22 DIAGNOSIS — K922 Gastrointestinal hemorrhage, unspecified: Secondary | ICD-10-CM | POA: Diagnosis not present

## 2019-10-22 DIAGNOSIS — I10 Essential (primary) hypertension: Secondary | ICD-10-CM | POA: Diagnosis not present

## 2019-10-22 DIAGNOSIS — Z79899 Other long term (current) drug therapy: Secondary | ICD-10-CM | POA: Diagnosis not present

## 2019-10-22 DIAGNOSIS — Z7984 Long term (current) use of oral hypoglycemic drugs: Secondary | ICD-10-CM | POA: Diagnosis not present

## 2019-10-22 NOTE — Progress Notes (Signed)
Established Patient Office Visit  Subjective:  Patient ID: Joe HODSDON Sr., male    DOB: [birth year]  Age: 58 y.o. MRN: [id number]  CC:  Chief Complaint  Patient presents with  . Hospitalization Follow-up    Microcytic hypochromic anemia     HPI Joe MERCADEL Sr. presents in follow-up of recent emergency department visit on 10/16/2019 due to severe anemia noted on CBC after his office visit on 10/14/2019.  Patient had hospitalization in January of this year after presenting to the emergency department with 3-day onset of epigastric pain, bloody emesis and melena without prior history of GI bleed.  Patient had been drinking 1-2 beers daily and taking Aleve to help with the abdominal pain prior to presentation to the emergency department.  At ED presentation he had a hemoglobin of 7 and patient received gastroenterology consult and extensive GI work-up including EGD, colonoscopy and video capsule which failed to demonstrate the source of bleeding.  He also had abdominal MRI and CT showing a pseudoaneurysm of the gastroduodenal artery for which patient underwent procedure per interventional radiology for coiling of the pseudoaneurysm.  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  At his office visit on 10/14/2019, patient denied any abdominal pain, no melena or blood in the stool but CBC done at office visit had hemoglobin of 5.7 and patient was contacted to go to the emergency department for further evaluation and treatment.         Patient reports that since receiving the transfusion at the emergency department he feels better.  He did not realize how greatly he was fatigued until he had recent transfusion.  He had also noted in the past few months that his hands feel cold often and that the palms of his hands were pale.  He denies any abdominal pain, no blood in the stool and no  black/dark-colored stools in the past few months but admits that he had not been taking iron supplement previously prescribed.  He denies any chest pain or palpitations, no shortness of breath or cough and no peripheral edema.  Overall he feels well.  Past Medical History:  Diagnosis Date  . Hypertension 2016  . Mass of pancreas 03/11/2019  . Microcytic anemia   . 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 COLONOSCOPY WITH PROPOFOL N/A 11/08/2018   Procedure: COLONOSCOPY WITH PROPOFOL;  Surgeon: Lavena Bullion, DO;  Location: [facility];  Service: Gastroenterology;  Laterality: N/A;  . ESOPHAGOGASTRODUODENOSCOPY (EGD) WITH PROPOFOL N/A 11/06/2018   Procedure: ESOPHAGOGASTRODUODENOSCOPY (EGD) WITH PROPOFOL;  Surgeon: Carol Ada, MD;  Location: [facility];  Service: Endoscopy;  Laterality: N/A;  . EUS  11/06/2018   Procedure: FULL UPPER ENDOSCOPIC ULTRASOUND (EUS) RADIAL;  Surgeon: Carol Ada, MD;  Location: [facility];  Service: Endoscopy;;  . GIVENS CAPSULE STUDY N/A 11/08/2018   Procedure: GIVENS CAPSULE STUDY;  Surgeon: Lavena Bullion, DO;  Location: [facility];  Service: Gastroenterology;  Laterality: N/A;  . IR ANGIOGRAM SELECTIVE EACH ADDITIONAL VESSEL  11/10/2018  . IR ANGIOGRAM VISCERAL SELECTIVE  11/10/2018  . IR ANGIOGRAM VISCERAL SELECTIVE  11/10/2018  . IR EMBO ART  VEN HEMORR LYMPH EXTRAV  INC GUIDE ROADMAPPING  11/10/2018  . IR US GUIDE VASC ACCESS RIGHT  11/10/2018  . KNEE SURGERY Bilateral 1985   Arthroscopic for torn cartilage and  ligaments.      Family History  Problem Relation Age of Onset  . Cancer Mother        uncertain what was wrong--mother would not share, but gradual decline.  . Hypertension Mother   . Hypertension Father     Social History   Socioeconomic History  . Marital status: Soil scientist    Spouse name: [name]  . Number of children: 2  . Years of  education: 47  . Highest education level: 12th grade  Occupational History  . Occupation: Packing/stacking/loading    Comment: soaps, shaving implements, etc.  Tobacco Use  . Smoking status: Never Smoker  . Smokeless tobacco: Never Used  Substance and Sexual Activity  . Alcohol use: Not Currently    Comment: 44 oz daily  . Drug use: No  . Sexual activity: Yes  Other Topics Concern  . Not on file  Social History Narrative   Works second shift at [employer] with his partner of 30+ years and 4 yo daughter, [name].      Social Determinants of Health   Financial Resource Strain:   . Difficulty of Paying Living Expenses: Not on file  Food Insecurity:   . Worried About Charity fundraiser in the Last Year: Not on file  . Ran Out of Food in the Last Year: Not on file  Transportation Needs:   . Lack of Transportation (Medical): Not on file  . Lack of Transportation (Non-Medical): Not on file  Physical Activity:   . Days of Exercise per Week: Not on file  . Minutes of Exercise per Session: Not on file  Stress:   . Feeling of Stress : Not on file  Social Connections:   . Frequency of Communication with Friends and Family: Not on file  . Frequency of Social Gatherings with Friends and Family: Not on file  . Attends Religious Services: Not on file  . Active Member of Clubs or Organizations: Not on file  . Attends Archivist Meetings: Not on file  . Marital Status: Not on file  Intimate Partner Violence:   . Fear of Current or Ex-Partner: Not on file  . Emotionally Abused: Not on file  . Physically Abused: Not on file  . Sexually Abused: Not on file    Outpatient Medications Prior to Visit  Medication Sig Dispense Refill  . ferrous sulfate 325 (65 FE) MG tablet Take 1 tablet (325 mg total) by mouth daily with breakfast. 30 tablet 0  . losartan (COZAAR) 50 MG tablet Take 1 tablet (50 mg total) by mouth daily. To lower blood pressure 90 tablet 1  .  metFORMIN (GLUCOPHAGE) 500 MG tablet 1 pill daily after the evening meal to help with blood sugar control 90 tablet 1  . Blood Pressure Monitor KIT Used to monitor blood pressure daily and as directed (Patient not taking: Reported on 10/22/2019) 1 each 0  . ferrous sulfate 325 (65 FE) MG tablet 1 tablet once per day after eating for anemia treatment 90 tablet 1  . ondansetron (ZOFRAN) 4 MG tablet Take 1 tablet (4 mg total) by mouth every 6 (six) hours as needed for nausea. 20 tablet 0   No facility-administered medications prior to visit.    Allergies  Allergen Reactions  . Hctz [Hydrochlorothiazide] Rash    ROS Review of Systems  Constitutional: Negative for chills, fatigue and fever.  HENT: Negative for sore throat and trouble swallowing.   Eyes: Negative  for photophobia and visual disturbance.  Respiratory: Negative for cough and shortness of breath.   Cardiovascular: Negative for chest pain, palpitations and leg swelling.  Gastrointestinal: Negative for abdominal pain, blood in stool, constipation, diarrhea and nausea.  Endocrine: Negative for polydipsia, polyphagia and polyuria.       Had noticed that his hands were often cold prior to recent transfusion  Genitourinary: Negative for dysuria and frequency.  Musculoskeletal: Negative for arthralgias and back pain.  Neurological: Negative for dizziness and headaches.  Hematological: Negative for adenopathy. Does not bruise/bleed easily.  Psychiatric/Behavioral: Negative for self-injury and suicidal ideas. The patient is not nervous/anxious.       Objective:    Physical Exam  Constitutional: He is oriented to person, place, and time. He appears well-developed and well-nourished.  Neck: No JVD present.  Cardiovascular: Normal rate and regular rhythm.  Pulmonary/Chest: Effort normal and breath sounds normal.  Abdominal: Soft. There is no abdominal tenderness. There is no rebound and no guarding.  Musculoskeletal:         General: No tenderness or edema.     Cervical back: Normal range of motion.     Comments: No CVA tenderness  Lymphadenopathy:    He has no cervical adenopathy.  Neurological: He is alert and oriented to person, place, and time.  Skin: Skin is warm and dry.  Psychiatric: He has a normal mood and affect. His behavior is normal.  Nursing note and vitals reviewed.   BP 138/79 (BP Location: Left Arm, Patient Position: Sitting, Cuff Size: Normal)   Pulse 69   Temp 97.9 F (36.6 C) (Oral)   Ht 5' 10"  (1.778 m)   Wt 167 lb 12.8 oz (76.1 kg)   SpO2 99%   BMI 24.08 kg/m  Wt Readings from Last 3 Encounters:  10/22/19 167 lb 12.8 oz (76.1 kg)  10/16/19 163 lb (73.9 kg)  10/14/19 163 lb (73.9 kg)     Lab Results  Component Value Date   TSH 1.060 10/14/2019   Lab Results  Component Value Date   WBC 2.7 (L) 10/16/2019   HGB 5.5 (LL) 10/16/2019   HCT 21.1 (L) 10/16/2019   MCV 63.0 (L) 10/16/2019   PLT 270 10/16/2019   Lab Results  Component Value Date   NA 138 10/16/2019   K 4.1 10/16/2019   CO2 22 10/16/2019   GLUCOSE 95 10/16/2019   BUN 10 10/16/2019   CREATININE 0.83 10/16/2019   BILITOT 0.8 10/16/2019   ALKPHOS 72 10/16/2019   AST 21 10/16/2019   ALT 19 10/16/2019   PROT 5.8 (L) 10/16/2019   ALBUMIN 3.4 (L) 10/16/2019   CALCIUM 8.5 (L) 10/16/2019   ANIONGAP 7 10/16/2019   GFR 99.85 03/11/2019   Lab Results  Component Value Date   CHOL 81 (L) 07/08/2018   Lab Results  Component Value Date   HDL 46 07/08/2018   Lab Results  Component Value Date   LDLCALC 27 07/08/2018   Lab Results  Component Value Date   TRIG 42 07/08/2018   No results found for: [facility] Lab Results  Component Value Date   HGBA1C 5.3 10/14/2019      Assessment & Plan:  1. Anemia due to GI blood loss; 2.  Encounter for examination following treatment at hospital Patient was seen in the office on 10/14/2019 in follow-up of chronic medical issues and blood work revealed hemoglobin of  5.7 and patient was contacted and followed up at the emergency department on 10/16/2019 where repeat  hemoglobin was 5.5 and patient received 2 units of packed red blood cells.  Patient reports that he had not been taking iron supplements that were previously prescribed.  He denied any black stools/melena and no visible blood in the stool over the past few months.  He does have a history of prior anemia due to GI blood loss and he will be referred back to gastroenterology for further evaluation and patient is also being referred to hematology as per ED notes, he may benefit from iron transfusion.  Patient with iron level of 9 in the emergency department on 10/16/2019 and TIBC of 522.  Patient will have repeat CBC at today's visit and will return in 4 weeks for repeat CBC depending on today's results.  He is aware that if he does have blood in the stool or black stools that he needs to seek medical attention. - CBC - Ambulatory referral to Gastroenterology - Ambulatory referral to Hematology - CBC; Future   An After Visit Summary was printed and given to the patient.  Follow-up: Return in about 3 months (around 01/20/2020) for chronic or keep scheduled; 4 week lab visit.   Antony Blackbird, MD

## 2019-10-23 LAB — CBC
Hematocrit: 28.5 % — ABNORMAL LOW (ref 37.5–51.0)
Hemoglobin: 7.5 g/dL — ABNORMAL LOW (ref 13.0–17.7)
MCH: 18.3 pg — ABNORMAL LOW (ref 26.6–33.0)
MCHC: 26.3 g/dL — ABNORMAL LOW (ref 31.5–35.7)
MCV: 70 fL — ABNORMAL LOW (ref 79–97)
Platelets: 252 x10E3/uL (ref 150–450)
RBC: 4.09 x10E6/uL — ABNORMAL LOW (ref 4.14–5.80)
RDW: 25.1 % — ABNORMAL HIGH (ref 11.6–15.4)
WBC: 4.1 x10E3/uL (ref 3.4–10.8)

## 2019-11-22 ENCOUNTER — Other Ambulatory Visit: Payer: Self-pay

## 2019-11-22 ENCOUNTER — Ambulatory Visit: Payer: Medicaid Other | Attending: Family Medicine

## 2019-11-22 DIAGNOSIS — D500 Iron deficiency anemia secondary to blood loss (chronic): Secondary | ICD-10-CM

## 2019-11-23 LAB — CBC
Hematocrit: 30.4 % — ABNORMAL LOW (ref 37.5–51.0)
Hemoglobin: 8.8 g/dL — ABNORMAL LOW (ref 13.0–17.7)
MCH: 19.9 pg — ABNORMAL LOW (ref 26.6–33.0)
MCHC: 28.9 g/dL — ABNORMAL LOW (ref 31.5–35.7)
MCV: 69 fL — ABNORMAL LOW (ref 79–97)
Platelets: 347 x10E3/uL (ref 150–450)
RBC: 4.42 x10E6/uL (ref 4.14–5.80)
RDW: 25.6 % — ABNORMAL HIGH (ref 11.6–15.4)
WBC: 4 x10E3/uL (ref 3.4–10.8)

## 2019-11-25 ENCOUNTER — Telehealth: Payer: Self-pay | Admitting: Family Medicine

## 2019-11-25 NOTE — Telephone Encounter (Signed)
Pt. Called back requesting lab results. Please f/u

## 2019-11-25 NOTE — Telephone Encounter (Signed)
Pt would like a call back with lab results from labs completed on 11/21/2018. Please follow up

## 2019-11-25 NOTE — Telephone Encounter (Signed)
Pt. Was informed on lab results.

## 2020-01-24 ENCOUNTER — Ambulatory Visit: Payer: Medicaid Other | Admitting: Family Medicine

## 2020-02-17 ENCOUNTER — Ambulatory Visit: Payer: Medicaid Other | Attending: Internal Medicine

## 2020-02-17 DIAGNOSIS — Z23 Encounter for immunization: Secondary | ICD-10-CM

## 2020-02-17 NOTE — Progress Notes (Signed)
   [id number] [facility]  Name:  Joe MOULIN Sr.    MRN: [id number] DOB: [birth year]  02/17/2020  [name] was observed post Covid-19 immunization for 15 minutes without incident. He was provided with Vaccine Information Sheet and instruction to access the V-Safe system.   [name] was instructed to call 911 with any severe reactions post vaccine: Marland Kitchen Difficulty breathing  . Swelling of face and throat  . A fast heartbeat  . A bad rash all over body  . Dizziness and weakness   Immunizations Administered    Name Date Dose VIS Date Route   Pfizer COVID-19 Vaccine 02/17/2020  8:29 AM 0.3 mL 10/15/2019 Intramuscular   Manufacturer: [place]   Lot: [id number]   [place]: [id number]

## 2020-03-13 ENCOUNTER — Ambulatory Visit: Payer: Medicaid Other | Attending: Internal Medicine

## 2020-03-13 DIAGNOSIS — Z23 Encounter for immunization: Secondary | ICD-10-CM

## 2020-03-13 NOTE — Progress Notes (Signed)
   [id number] [facility]ination [facility]  Name:  STEFEN JAQUAY Sr.    MRN: [id number] DOB: 11/14/1960  03/13/2020  Mr. Manzer was observed post Covid-19 immunization for 15 minutes without incident. He was provided with Vaccine Information Sheet and instruction to access the V-Safe system.   Mr. Curles was instructed to call 911 with any severe reactions post vaccine: Marland Kitchen Difficulty breathing  . Swelling of face and throat  . A fast heartbeat  . A bad rash all over body  . Dizziness and weakness   Immunizations Administered    Name Date Dose VIS Date Route   Pfizer COVID-19 Vaccine 03/13/2020  8:41 AM 0.3 mL 12/29/2018 Intramuscular   Manufacturer: [place]   Lot: [id number]   [place]: [id number]

## 2020-03-16 ENCOUNTER — Other Ambulatory Visit: Payer: Self-pay

## 2020-03-16 ENCOUNTER — Ambulatory Visit: Payer: Medicaid Other | Attending: Family Medicine | Admitting: Physician Assistant

## 2020-03-16 VITALS — BP 155/94 | HR 63 | Temp 97.9°F | Ht 70.0 in | Wt 155.0 lb

## 2020-03-16 DIAGNOSIS — Z79899 Other long term (current) drug therapy: Secondary | ICD-10-CM | POA: Diagnosis not present

## 2020-03-16 DIAGNOSIS — D500 Iron deficiency anemia secondary to blood loss (chronic): Secondary | ICD-10-CM | POA: Insufficient documentation

## 2020-03-16 DIAGNOSIS — Z7984 Long term (current) use of oral hypoglycemic drugs: Secondary | ICD-10-CM | POA: Diagnosis not present

## 2020-03-16 DIAGNOSIS — M25512 Pain in left shoulder: Secondary | ICD-10-CM | POA: Insufficient documentation

## 2020-03-16 DIAGNOSIS — R7303 Prediabetes: Secondary | ICD-10-CM | POA: Insufficient documentation

## 2020-03-16 DIAGNOSIS — Z791 Long term (current) use of non-steroidal anti-inflammatories (NSAID): Secondary | ICD-10-CM | POA: Diagnosis not present

## 2020-03-16 DIAGNOSIS — I10 Essential (primary) hypertension: Secondary | ICD-10-CM | POA: Insufficient documentation

## 2020-03-16 LAB — GLUCOSE, POCT (MANUAL RESULT ENTRY): POC Glucose: 130 mg/dl — AB (ref 70–99)

## 2020-03-16 MED ORDER — METHOCARBAMOL 500 MG PO TABS: 1000.0000 mg | ORAL_TABLET | Freq: Three times a day (TID) | ORAL | 0 refills | Status: DC | PRN

## 2020-03-16 MED ORDER — METFORMIN HCL 500 MG PO TABS: ORAL_TABLET | ORAL | 1 refills | Status: DC

## 2020-03-16 MED ORDER — LOSARTAN POTASSIUM 50 MG PO TABS: 50.0000 mg | ORAL_TABLET | Freq: Every day | ORAL | 1 refills | Status: DC

## 2020-03-16 MED ORDER — METHYLPREDNISOLONE SODIUM SUCC 125 MG IJ SOLR
125.0000 mg | Freq: Once | INTRAMUSCULAR | Status: AC
  Administered 2020-03-16: 125 mg via INTRAMUSCULAR

## 2020-03-16 MED ORDER — MELOXICAM 15 MG PO TABS: 15.0000 mg | ORAL_TABLET | Freq: Every day | ORAL | 0 refills | Status: DC

## 2020-03-16 NOTE — Progress Notes (Signed)
Caedon Bond, is a 59 y.o. male  [id number]  [id number]  DOB - 1961/06/28  Subjective:  Chief Complaint and HPI: Weber Monnier is a 59 y.o. male here today  For L shoulder pain that has been going on for 2 months after a fall he had that he sort of landed against his shoulder.  Unable to lift much.  Pain is worse esp at night.  +aching.  Moderate pain.  OTC not helping much.  Denies CP, SOB.  R hand dominant.    Anemia needs to be rechecked.  No dizziness or fatigue.   Blood sugars < 130 at home.  Compliant with meds.    ROS:   Constitutional:  No f/c, No night sweats, No unexplained weight loss. EENT:  No vision changes, No blurry vision, No hearing changes. No mouth, throat, or ear problems.  Respiratory: No cough, No SOB Cardiac: No CP, no palpitations GI:  No abd pain, No N/V/D. GU: No Urinary s/sx Musculoskeletal: see above Neuro: No headache, no dizziness, no motor weakness.  Skin: No rash Endocrine:  No polydipsia. No polyuria.  Psych: Denies SI/HI  No problems updated.  ALLERGIES: Allergies  Allergen Reactions  . Hctz [Hydrochlorothiazide] Rash    PAST MEDICAL HISTORY: Past Medical History:  Diagnosis Date  . Hypertension 2016  . Mass of pancreas 03/11/2019  . Microcytic anemia   . 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 Authorizing Provider  ferrous sulfate 325 (65 FE) MG tablet Take 1 tablet (325 mg total) by mouth daily with breakfast. 10/16/19  Yes Lajean Saver, MD  losartan (COZAAR) 50 MG tablet Take 1 tablet (50 mg total) by mouth daily. To lower blood pressure 10/14/19  Yes Fulp, Cammie, MD  metFORMIN (GLUCOPHAGE) 500 MG tablet 1 pill daily after the evening meal to help with blood sugar control 10/14/19  Yes Fulp, Cammie, MD  Blood Pressure Monitor KIT Used to monitor blood pressure daily and as directed Patient  not taking: Reported on 10/22/2019 04/14/19   Fulp, Cammie, MD  meloxicam (MOBIC) 15 MG tablet Take 1 tablet (15 mg total) by mouth daily. Prn pain 03/16/20   Argentina Donovan, PA-C  methocarbamol (ROBAXIN) 500 MG tablet Take 2 tablets (1,000 mg total) by mouth every 8 (eight) hours as needed for muscle spasms. 03/16/20   Argentina Donovan, PA-C     Objective:  EXAM:   Vitals:   03/16/20 0856  BP: (!) 155/94  Pulse: 63  Temp: 97.9 F (36.6 C)  TempSrc: Temporal  SpO2: 100%  Weight: 155 lb (70.3 kg)  Height: 5' 10"  (1.778 m)    General appearance : A&OX3. NAD. Non-toxic-appearing HEENT: Atraumatic and Normocephalic.  PERRLA. EOM intact.   Chest/Lungs:  Breathing-non-labored, Good air entry bilaterally, breath sounds normal without rales, rhonchi, or wheezing  CVS: S1 S2 regular, no murmurs, gallops, rubs  R arm/shoulder-full S&ROM.  L arm with 50% decreased ROM.  Unable to externally rotate.  + speeds.  +empty can test.  DTR = intact B.   Extremities: Bilateral Lower Ext shows no edema, both legs are warm to touch with = pulse throughout Neurology:  CN II-XII grossly intact, Non focal.   Psych:  TP linear. J/I WNL. Normal speech. Appropriate eye contact and affect.  Skin:  No Rash  Data Review Lab Results  Component Value Date   HGBA1C 5.3 10/14/2019  HGBA1C 5.7 (H) 03/31/2019   HGBA1C 5.5 03/31/2019     Assessment & Plan   1. Prediabetes Await a1C, continue metformin as-is for now - Glucose (CBG) - Hemoglobin A1c - Comprehensive metabolic panel  2. Acute pain of left shoulder - methylPREDNISolone sodium succinate (SOLU-MEDROL) 125 mg/2 mL injection 125 mg - meloxicam (MOBIC) 15 MG tablet; Take 1 tablet (15 mg total) by mouth daily. Prn pain  Dispense: 30 tablet; Refill: 0 - methocarbamol (ROBAXIN) 500 MG tablet; Take 2 tablets (1,000 mg total) by mouth every 8 (eight) hours as needed for muscle spasms.  Dispense: 90 tablet; Refill: 0 - Ambulatory referral to  Orthopedic Surgery  3. Anemia due to GI blood loss - CBC with Differential/Platelet  4. Essential hypertension Did not take losartan today.  Check BP OOO and record. We have discussed target BP range and blood pressure goal. I have advised patient to check BP regularly and to call us back or report to clinic if the numbers are consistently higher than 140/90. We discussed the importance of compliance with medical therapy and DASH diet recommended, consequences of uncontrolled hypertension discussed.  - Comprehensive metabolic panel  Patient have been counseled extensively about nutrition and exercise  Return in about 4 months (around 07/17/2020) for PCP;  chronic conditions.  The patient was given clear instructions to go to ER or return to medical center if symptoms don't improve, worsen or new problems develop. The patient verbalized understanding. The patient was told to call to get lab results if they haven't heard anything in the next week.     Freeman Caldron, PA-C [facility] [place], [place]   03/16/2020, 9:19 AMPatient ID: Gloriann Loan Sr., male   DOB: 01-Jan-1961, 59 y.o.   MRN: [id number]

## 2020-03-16 NOTE — Patient Instructions (Signed)
Rotator Cuff Tear  A rotator cuff tear is a partial or complete tear of the cord-like bands (tendons) that connect muscle to bone in the rotator cuff. The rotator cuff is a group of muscles and tendons that surround the shoulder joint and keep the upper arm bone (humerus) in the shoulder socket. The tear can occur suddenly (acute tear) or can develop over a long period of time (chronic tear). What are the causes? Acute tears may be caused by:  A fall, especially on an outstretched arm.  Lifting very heavy objects with a jerking motion. Chronic tears may be caused by overuse of the muscles. This may happen in sports, physical work, or activities in which your arm repeatedly moves over your head. What increases the risk? This condition is more likely to occur in:  Athletes and workers who frequently use their shoulder or reach over their heads. This may include activities such as: ? Tennis. ? Baseball and softball. ? Swimming and rowing. ? Weightlifting. ? Construction work. ? Painting.  People who smoke.  Older people who have arthritis or poor blood supply. These can make the muscles and tendons weaker. What are the signs or symptoms? Symptoms of this condition depend on the type and severity of the injury:  An acute tear may include a sudden tearing feeling, followed by severe pain that goes from your upper shoulder, down your arm, and toward your elbow.  A chronic tear includes a gradual weakness and decreased shoulder motion as the pain gets worse. The pain is usually worse at night. Both types may have symptoms such as:  Pain that spreads (radiates) from the shoulder to the upper arm.  Swelling and tenderness in front of the shoulder.  Decreased range of motion.  Pain when: ? Reaching, pulling, or lifting the arm above the head. ? Lowering the arm from above the head.  Not being able to raise your arm out to the side.  Difficulty placing the arm behind your back. How  is this diagnosed? This condition is diagnosed with a medical history and physical exam. Imaging tests may also be done, including:  X-rays.  MRI.  Ultrasound.  CT or MR arthrogram. During this test, a contrast material is injected into your shoulder and then images are taken. How is this treated? Treatment for this condition depends on the type and severity of the condition. In less severe cases, treatment may include:  Rest. This may be done with a sling that holds the shoulder still (immobilization). Your health care provider may also recommend avoiding activities that involve lifting your arm over your head.  Icing the shoulder.  Anti-inflammatory medicines, such as aspirin or ibuprofen.  Strengthening and stretching exercises. Your health care provider may recommend specific exercises to improve your range of motion and strengthen your shoulder. In more severe cases, treatment may include:  Physical therapy.  Steroid injections.  Surgery. Follow these instructions at home: Managing pain, stiffness, and swelling  If directed, put ice on the injured area. ? If you have a removable sling, remove it as told by your health care provider. ? Put ice in a plastic bag. ? Place a towel between your skin and the bag. ? Leave the ice on for 20 minutes, 2-3 times a day.  Raise (elevate) the injured area above the level of your heart while you are lying down.  Find a comfortable sleeping position or sleep on a recliner, if available.  Move your fingers often to avoid stiffness   and to lessen swelling. °· Once the swelling has gone down, your health care provider may direct you to apply heat to relax the muscles. Use the heat source that your health care provider recommends, such as a moist heat pack or a heating pad. °? Place a towel between your skin and the heat source. °? Leave the heat on for 20-30 minutes. °? Remove the heat if your skin turns bright red. This is especially  important if you are unable to feel pain, heat, or cold. You may have a greater risk of getting burned. °If you have a sling: °· Wear the sling as told by your health care provider. Remove it only as told by your health care provider. °· Loosen the sling if your fingers tingle, become numb, or turn cold and blue. °· Keep the sling clean. °· If the sling is not waterproof: °? Do not let it get wet. °? Cover it with a watertight covering when you take a bath or a shower. °Driving °· Do not drive or use heavy machinery while taking prescription pain medicine. °· Ask your health care provider when it is safe to drive if you have a sling on your arm. °Activity °· Rest your shoulder as told by your health care provider. °· Return to your normal activities as told by your health care provider. Ask your health care provider what activities are safe for you. °· Do any exercises or stretches as told by your health care provider. °General instructions °· Do not use any products that contain nicotine or tobacco, such as cigarettes and e-cigarettes. If you need help quitting, ask your health care provider. °· Take over-the-counter and prescription medicines only as told by your health care provider. °· Keep all follow-up visits as told by your health care provider. This is important. °Contact a health care provider if: °· Your pain gets worse. °· You have new pain in your arm, hands, or fingers. °· Medicine does not help your pain. °Get help right away if: °· Your arm, hand, or fingers are numb or tingling. °· Your arm, hand, or fingers are swollen or painful or they turn white or blue. °· Your hand or fingers on your injured arm are colder than your other hand. °Summary °· A rotator cuff tear is a partial or complete tear of the cord-like bands (tendons) that connect muscle to bone in the rotator cuff. °· The tear can occur suddenly (acute tear) or can develop over a long period of time (chronic tear). °· Treatment generally  includes rest, anti-inflammatory medicines, and icing. In some cases, physical therapy and steroid injections may be needed. In severe cases, surgery may be needed. °This information is not intended to replace advice given to you by your health care provider. Make sure you discuss any questions you have with your health care provider. °Document Revised: 10/03/2017 Document Reviewed: 01/06/2017 °Elsevier Patient Education © 2020 Elsevier Inc. ° °

## 2020-03-17 LAB — COMPREHENSIVE METABOLIC PANEL
ALT: 106 IU/L — ABNORMAL HIGH (ref 0–44)
AST: 194 IU/L — ABNORMAL HIGH (ref 0–40)
Albumin/Globulin Ratio: 1.5 (ref 1.2–2.2)
Albumin: 4.3 g/dL (ref 3.8–4.9)
Alkaline Phosphatase: 132 IU/L — ABNORMAL HIGH (ref 39–117)
BUN/Creatinine Ratio: 10 (ref 9–20)
BUN: 8 mg/dL (ref 6–24)
Bilirubin Total: 1.5 mg/dL — ABNORMAL HIGH (ref 0.0–1.2)
CO2: 18 mmol/L — ABNORMAL LOW (ref 20–29)
Calcium: 9.3 mg/dL (ref 8.7–10.2)
Chloride: 108 mmol/L — ABNORMAL HIGH (ref 96–106)
Creatinine, Ser: 0.84 mg/dL (ref 0.76–1.27)
GFR calc Af Amer: 112 mL/min/{1.73_m2} (ref >59)
GFR calc non Af Amer: 97 mL/min/{1.73_m2} (ref >59)
Globulin, Total: 2.9 g/dL (ref 1.5–4.5)
Glucose: 102 mg/dL — ABNORMAL HIGH (ref 65–99)
Potassium: 4.6 mmol/L (ref 3.5–5.2)
Sodium: 144 mmol/L (ref 134–144)
Total Protein: 7.2 g/dL (ref 6.0–8.5)

## 2020-03-17 LAB — CBC WITH DIFFERENTIAL/PLATELET
Basophils Absolute: 0 10*3/uL (ref 0.0–0.2)
Basos: 1 %
EOS (ABSOLUTE): 0.1 10*3/uL (ref 0.0–0.4)
Eos: 3 %
Hematocrit: 41.4 % (ref 37.5–51.0)
Hemoglobin: 12.8 g/dL — ABNORMAL LOW (ref 13.0–17.7)
Immature Grans (Abs): 0 10*3/uL (ref 0.0–0.1)
Immature Granulocytes: 1 %
Lymphocytes Absolute: 0.9 10*3/uL (ref 0.7–3.1)
Lymphs: 24 %
MCH: 23.1 pg — ABNORMAL LOW (ref 26.6–33.0)
MCHC: 30.9 g/dL — ABNORMAL LOW (ref 31.5–35.7)
MCV: 75 fL — ABNORMAL LOW (ref 79–97)
Monocytes Absolute: 0.4 10*3/uL (ref 0.1–0.9)
Monocytes: 12 %
Neutrophils Absolute: 2.3 10*3/uL (ref 1.4–7.0)
Neutrophils: 59 %
Platelets: 330 10*3/uL (ref 150–450)
RBC: 5.53 x10E6/uL (ref 4.14–5.80)
RDW: 20 % — ABNORMAL HIGH (ref 11.6–15.4)
WBC: 3.8 10*3/uL (ref 3.4–10.8)

## 2020-03-17 LAB — HEMOGLOBIN A1C
Est. average glucose Bld gHb Est-mCnc: 120 mg/dL
Hgb A1c MFr Bld: 5.8 % — ABNORMAL HIGH (ref 4.8–5.6)

## 2020-03-20 ENCOUNTER — Other Ambulatory Visit: Payer: Self-pay

## 2020-03-20 ENCOUNTER — Ambulatory Visit: Payer: Medicaid Other | Attending: Family Medicine | Admitting: Family Medicine

## 2020-03-20 ENCOUNTER — Encounter: Payer: Self-pay | Admitting: Family Medicine

## 2020-03-20 DIAGNOSIS — M25512 Pain in left shoulder: Secondary | ICD-10-CM

## 2020-03-20 NOTE — Progress Notes (Signed)
Patient states that he needs a letter to take him out of work until 03/27/2020, he has xray's on Thursday for shoulder.  States that he is still having pain

## 2020-03-20 NOTE — Progress Notes (Signed)
Virtual Visit via Telephone Note  I connected with Joe [name]., on 03/20/2020 at 1:36 PM by telephone due to the COVID-19 pandemic and verified that I am speaking with the correct person using two identifiers.   Consent: I discussed the limitations, risks, security and privacy concerns of performing an evaluation and management service by telephone and the availability of in person appointments. I also discussed with the patient that there may be a patient responsible charge related to this service. The patient expressed understanding and agreed to proceed.   Location of Patient: Home  Location of Provider: Clinic   Persons participating in Telemedicine visit: KEELYN FJELSTAD SrElmo Putt [place]-CMA Dr. Margarita Rana     History of Present Illness: 59 year old male with a history of hypertension, prediabetes (A1c 5.8) who presents today for an acute visit.  He had a visit with the physician assistant 4 days ago for left shoulder pain which he had had for 2 months after a fall and for which he was placed on meloxicam.  He had also received Solu-Medrol injection at his visit. His left shoulder pain still persists He has xrays scheduled for 03/23/20 and  was referred to [facility] as well He needs a note to take him out of work till 03/27/20 and is otherwise doing well.  Past Medical History:  Diagnosis Date  . Hypertension 2016  . Mass of pancreas 03/11/2019  . Microcytic anemia   . 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 Hctz [Hydrochlorothiazide] Rash    Current Outpatient Medications on File Prior to Visit  Medication Sig Dispense Refill  . ferrous sulfate 325 (65 FE) MG tablet Take 1 tablet (325 mg total) by mouth daily with breakfast. 30 tablet 0  . losartan (COZAAR) 50 MG tablet Take 1 tablet (50 mg total) by mouth daily. To lower blood pressure 90 tablet 1  . meloxicam (MOBIC) 15 MG tablet Take 1 tablet  (15 mg total) by mouth daily. Prn pain 30 tablet 0  . metFORMIN (GLUCOPHAGE) 500 MG tablet 1 pill daily after the evening meal to help with blood sugar control 90 tablet 1  . methocarbamol (ROBAXIN) 500 MG tablet Take 2 tablets (1,000 mg total) by mouth every 8 (eight) hours as needed for muscle spasms. 90 tablet 0  . Blood Pressure Monitor KIT Used to monitor blood pressure daily and as directed (Patient not taking: Reported on 10/22/2019) 1 each 0   No current facility-administered medications on file prior to visit.    Observations/Objective: Awake, alert, oriented x3 Not in acute distress  Lab Results  Component Value Date   HGBA1C 5.8 (H) 03/16/2020     Assessment and Plan: 1. Acute pain of left shoulder Currently on meloxicam Scheduled to see Ortho care and also has x-ray coming up later this week I have provided him with a work note to excuse him until 03/27/2020    Follow Up Instructions: Keep previously scheduled appointment with PCP   I discussed the assessment and treatment plan with the patient. The patient was provided an opportunity to ask questions and all were answered. The patient agreed with the plan and demonstrated an understanding of the instructions.   The patient was advised to call back or seek an in-person evaluation if the symptoms worsen or if the condition fails to improve as anticipated.     I provided 11 minutes total of non-face-to-face time during this encounter including median intraservice time,  reviewing previous notes, investigations, ordering medications, medical decision making, coordinating care and patient verbalized understanding at the end of the visit.     Charlott Rakes, MD, FAAFP. [facility] and [facility] [place], [place]   03/20/2020, 1:36 PM

## 2020-03-23 ENCOUNTER — Ambulatory Visit (INDEPENDENT_AMBULATORY_CARE_PROVIDER_SITE_OTHER): Payer: Medicaid Other | Admitting: Orthopaedic Surgery

## 2020-03-23 ENCOUNTER — Encounter: Payer: Self-pay | Admitting: Orthopaedic Surgery

## 2020-03-23 ENCOUNTER — Other Ambulatory Visit: Payer: Self-pay

## 2020-03-23 ENCOUNTER — Ambulatory Visit: Payer: Self-pay

## 2020-03-23 DIAGNOSIS — M25512 Pain in left shoulder: Secondary | ICD-10-CM

## 2020-03-23 NOTE — Progress Notes (Signed)
Office Visit Note   Patient: Joe PACE Sr.           Date of Birth: 1961-01-26           MRN: [id number] Visit Date: 03/23/2020              Requested by: Argentina Donovan, PA-C [place],  [place] [id number] PCP: Antony Blackbird, MD   Assessment & Plan: Visit Diagnoses:  1. Acute pain of left shoulder     Plan: My impression is subscapularis tear from mechanical fall.  We will need to obtain MRI to fully evaluate for structural abnormalities.  We will see him back after the MRI.  Work note provided today.  Follow-Up Instructions: Return for 10-14 days to review.   Orders:  Orders Placed This Encounter  Procedures  . XR Shoulder Left  . MR Shoulder Left w/o contrast   No orders of the defined types were placed in this encounter.     Procedures: No procedures performed   Clinical Data: No additional findings.   Subjective: Chief Complaint  Patient presents with  . Left Shoulder - Pain    Joe [name] is a very pleasant 58 year old gentleman comes in for evaluation of left shoulder pain.  He had an injury about 2 months ago in which she fell directly on his left shoulder.  He denies any numbness and tingling.  He has difficulty sleeping at night and he feels popping at night as well.  Pain is 9 out of 10.  He works in a [employer] and has unable to perform his job due to this physical requirements.  He is right-hand dominant.  He is feels pain and weakness.  Takes Mobic and Robaxin without significant relief.   Review of Systems  Constitutional: Negative.   All other systems reviewed and are negative.    Objective: Vital Signs: There were no vitals taken for this visit.  Physical Exam Vitals and nursing note reviewed.  Constitutional:      Appearance: He is well-developed.  HENT:     Head: Normocephalic and atraumatic.  Eyes:     Pupils: Pupils are equal, round, and reactive to light.  Pulmonary:     Effort: Pulmonary effort is normal.    Abdominal:     Palpations: Abdomen is soft.  Musculoskeletal:        General: Normal range of motion.     Cervical back: Neck supple.  Skin:    General: Skin is warm.  Neurological:     Mental Status: He is alert and oriented to person, place, and time.  Psychiatric:        Behavior: Behavior normal.        Thought Content: Thought content normal.        Judgment: Judgment normal.     Ortho Exam Left shoulder shows increased external rotation with moderate pain.  Pain and weakness with forward flexion.  Negative crank.  Positive bearhug positive belly press negative liftoff.  Negative empty can. Specialty Comments:  No specialty comments available.  Imaging: XR Shoulder Left  Result Date: 03/23/2020 No acute or structural abnormalities    PMFS History: Patient Active Problem List   Diagnosis Date Noted  . 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 Mass of pancreas 03/11/2019  . Microcytic anemia   . 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 Hypertension 11/04/2014   Past Medical History:  Diagnosis Date  .  Hypertension 2016  . Mass of pancreas 03/11/2019  . Microcytic anemia   . 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 Cancer Mother        uncertain what was wrong--mother would not share, but gradual decline.  . Hypertension Mother   . Hypertension Father     Past Surgical History:  Procedure Laterality Date  . COLONOSCOPY WITH PROPOFOL N/A 11/08/2018   Procedure: COLONOSCOPY WITH PROPOFOL;  Surgeon: Lavena Bullion, DO;  Location: [place];  Service: Gastroenterology;  Laterality: N/A;  . ESOPHAGOGASTRODUODENOSCOPY (EGD) WITH PROPOFOL N/A 11/06/2018   Procedure: ESOPHAGOGASTRODUODENOSCOPY (EGD) WITH PROPOFOL;  Surgeon: Carol Ada, MD;  Location: [place];  Service: Endoscopy;  Laterality: N/A;  . EUS   11/06/2018   Procedure: FULL UPPER ENDOSCOPIC ULTRASOUND (EUS) RADIAL;  Surgeon: Carol Ada, MD;  Location: [place];  Service: Endoscopy;;  . GIVENS CAPSULE STUDY N/A 11/08/2018   Procedure: GIVENS CAPSULE STUDY;  Surgeon: Lavena Bullion, DO;  Location: [place];  Service: Gastroenterology;  Laterality: N/A;  . IR ANGIOGRAM SELECTIVE EACH ADDITIONAL VESSEL  11/10/2018  . IR ANGIOGRAM VISCERAL SELECTIVE  11/10/2018  . IR ANGIOGRAM VISCERAL SELECTIVE  11/10/2018  . IR EMBO ART  VEN HEMORR LYMPH EXTRAV  INC GUIDE ROADMAPPING  11/10/2018  . IR US GUIDE VASC ACCESS RIGHT  11/10/2018  . KNEE SURGERY Bilateral 1985   Arthroscopic for torn cartilage and ligaments.     Social History   Occupational History  . Occupation: Packing/stacking/loading    Comment: soaps, shaving implements, etc.  Tobacco Use  . Smoking status: Never Smoker  . Smokeless tobacco: Never Used  Substance and Sexual Activity  . Alcohol use: Not Currently    Comment: 44 oz daily  . Drug use: No  . Sexual activity: Yes

## 2020-03-31 ENCOUNTER — Telehealth: Payer: Self-pay

## 2020-03-31 NOTE — Telephone Encounter (Signed)
Peer to peer scheduled for Tuesday June 1 @ 11:30   FYI

## 2020-04-04 NOTE — Telephone Encounter (Signed)
[id number]

## 2020-04-04 NOTE — Telephone Encounter (Signed)
Peer to peer done. Auth # [id number]

## 2020-04-05 ENCOUNTER — Telehealth: Payer: Self-pay | Admitting: Orthopaedic Surgery

## 2020-04-05 NOTE — Telephone Encounter (Signed)
noted 

## 2020-04-05 NOTE — Telephone Encounter (Signed)
IC patient to r/s MRI appointment.  Patient advised that he needs an updated [place] note stating that he will be out of work until his appointment with Dr. Erlinda Hong on June 9th.  CB#[phone].  Thank you.

## 2020-04-05 NOTE — Telephone Encounter (Signed)
yes

## 2020-04-05 NOTE — Telephone Encounter (Signed)
Okay to make note?

## 2020-04-06 ENCOUNTER — Ambulatory Visit: Payer: Medicaid Other | Admitting: Orthopaedic Surgery

## 2020-04-06 ENCOUNTER — Telehealth: Payer: Self-pay | Admitting: Orthopaedic Surgery

## 2020-04-06 NOTE — Telephone Encounter (Signed)
Pt called asking that Dr. Erlinda Hong write him a note for work letting them know he'll be out until his appt on 04/12/20. Pt would like a call when ready for pick up.   [phone]

## 2020-04-06 NOTE — Telephone Encounter (Signed)
Patient picked up brace.

## 2020-04-06 NOTE — Telephone Encounter (Signed)
Letter ready for pick up

## 2020-04-06 NOTE — Telephone Encounter (Signed)
note

## 2020-04-09 ENCOUNTER — Ambulatory Visit
Admission: RE | Admit: 2020-04-09 | Discharge: 2020-04-09 | Disposition: A | Discharge status: Home / Self Care | Payer: Medicaid Other | Source: Ambulatory Visit | Attending: Orthopaedic Surgery | Admitting: Orthopaedic Surgery

## 2020-04-09 DIAGNOSIS — M25512 Pain in left shoulder: Secondary | ICD-10-CM

## 2020-04-12 ENCOUNTER — Ambulatory Visit (INDEPENDENT_AMBULATORY_CARE_PROVIDER_SITE_OTHER): Payer: Medicaid Other | Admitting: Orthopaedic Surgery

## 2020-04-12 ENCOUNTER — Other Ambulatory Visit: Payer: Self-pay

## 2020-04-12 ENCOUNTER — Encounter: Payer: Self-pay | Admitting: Orthopaedic Surgery

## 2020-04-12 VITALS — Ht 70.0 in | Wt 155.0 lb

## 2020-04-12 DIAGNOSIS — S46812A Strain of other muscles, fascia and tendons at shoulder and upper arm level, left arm, initial encounter: Secondary | ICD-10-CM | POA: Insufficient documentation

## 2020-04-12 NOTE — Progress Notes (Signed)
Office Visit Note   Patient: Joe NICKOLSON Sr.           Date of Birth: 02-15-1961           MRN: [id number] Visit Date: 04/12/2020              Requested by: Antony Blackbird, MD [place],  [place] [id number] PCP: Antony Blackbird, MD   Assessment & Plan: Visit Diagnoses:  1. Traumatic tear of supraspinatus tendon of left shoulder, initial encounter   2. Full thickness tear of left subscapularis tendon, initial encounter     Plan: MRI of the left shoulder shows complete full-thickness retracted tears of the supraspinatus and subscapularis.  The biceps tendon is medially dislocated.  These findings were reviewed with the patient in detail and recommendation has been made for surgical repair as soon as possible.  Risk benefits rehab recovery reviewed including infection, neurovascular injury, incomplete relief of pain, failure to achieve the desired results.  Work note provided for light duty if available but if not anticipate out of work from now until at least 3 months after surgery.  Follow-Up Instructions: Return if symptoms worsen or fail to improve.   Orders:  No orders of the defined types were placed in this encounter.  No orders of the defined types were placed in this encounter.     Procedures: No procedures performed   Clinical Data: No additional findings.   Subjective: Chief Complaint  Patient presents with  . Left Shoulder - Follow-up    MRI review    Joe [name] returns today for MRI review of the left shoulder.  No changes in left shoulder function or symptoms.  Continues to have pain and weakness with any use or lifting.   Review of Systems  Constitutional: Negative.   All other systems reviewed and are negative.    Objective: Vital Signs: Ht 5\' 10"  (1.778 m)   Wt 155 lb (70.3 kg)   BMI 22.24 kg/m   Physical Exam Vitals and nursing note reviewed.  Constitutional:      Appearance: He is well-developed.  Pulmonary:     Effort:  Pulmonary effort is normal.  Abdominal:     Palpations: Abdomen is soft.  Skin:    General: Skin is warm.  Neurological:     Mental Status: He is alert and oriented to person, place, and time.  Psychiatric:        Behavior: Behavior normal.        Thought Content: Thought content normal.        Judgment: Judgment normal.     Ortho Exam Left shoulder shows positive empty can with significant weakness.  Positive belly press and positive bearhug.  Negative liftoff Specialty Comments:  No specialty comments available.  Imaging: No results found.   PMFS History: Patient Active Problem List   Diagnosis Date Noted  . Full thickness tear of left subscapularis tendon 04/12/2020  . Traumatic tear of supraspinatus tendon of left shoulder 04/12/2020  . 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 Mass of pancreas 03/11/2019  . Microcytic anemia   . 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 Hypertension 11/04/2014   Past Medical History:  Diagnosis Date  . Hypertension 2016  . Mass of pancreas 03/11/2019  . Microcytic anemia   . 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 Cancer  Mother        uncertain what was wrong--mother would not share, but gradual decline.  . Hypertension Mother   . Hypertension Father     Past Surgical History:  Procedure Laterality Date  . COLONOSCOPY WITH PROPOFOL N/A 11/08/2018   Procedure: COLONOSCOPY WITH PROPOFOL;  Surgeon: Lavena Bullion, DO;  Location: [facility];  Service: Gastroenterology;  Laterality: N/A;  . ESOPHAGOGASTRODUODENOSCOPY (EGD) WITH PROPOFOL N/A 11/06/2018   Procedure: ESOPHAGOGASTRODUODENOSCOPY (EGD) WITH PROPOFOL;  Surgeon: Carol Ada, MD;  Location: [facility];  Service: Endoscopy;  Laterality: N/A;  . EUS  11/06/2018   Procedure: FULL UPPER ENDOSCOPIC ULTRASOUND (EUS) RADIAL;   Surgeon: Carol Ada, MD;  Location: [facility];  Service: Endoscopy;;  . GIVENS CAPSULE STUDY N/A 11/08/2018   Procedure: GIVENS CAPSULE STUDY;  Surgeon: Lavena Bullion, DO;  Location: [facility];  Service: Gastroenterology;  Laterality: N/A;  . IR ANGIOGRAM SELECTIVE EACH ADDITIONAL VESSEL  11/10/2018  . IR ANGIOGRAM VISCERAL SELECTIVE  11/10/2018  . IR ANGIOGRAM VISCERAL SELECTIVE  11/10/2018  . IR EMBO ART  VEN HEMORR LYMPH EXTRAV  INC GUIDE ROADMAPPING  11/10/2018  . IR US GUIDE VASC ACCESS RIGHT  11/10/2018  . KNEE SURGERY Bilateral 1985   Arthroscopic for torn cartilage and ligaments.     Social History   Occupational History  . Occupation: Packing/stacking/loading    Comment: soaps, shaving implements, etc.  Tobacco Use  . Smoking status: Never Smoker  . Smokeless tobacco: Never Used  Substance and Sexual Activity  . Alcohol use: Not Currently    Comment: 44 oz daily  . Drug use: No  . Sexual activity: Yes

## 2020-04-24 ENCOUNTER — Other Ambulatory Visit: Payer: Medicaid Other

## 2020-05-03 ENCOUNTER — Other Ambulatory Visit: Payer: Self-pay

## 2020-05-03 ENCOUNTER — Encounter (HOSPITAL_BASED_OUTPATIENT_CLINIC_OR_DEPARTMENT_OTHER): Payer: Self-pay | Admitting: Orthopaedic Surgery

## 2020-05-03 ENCOUNTER — Other Ambulatory Visit: Payer: Self-pay | Admitting: Family

## 2020-05-03 NOTE — Progress Notes (Signed)
Patient's lab results for 03-16-20 reviewed with Dr Sabra Heck, no need to repeat.

## 2020-05-09 ENCOUNTER — Encounter (HOSPITAL_BASED_OUTPATIENT_CLINIC_OR_DEPARTMENT_OTHER): Payer: Self-pay | Admitting: Orthopaedic Surgery

## 2020-05-09 ENCOUNTER — Other Ambulatory Visit (HOSPITAL_COMMUNITY)
Admission: RE | Admit: 2020-05-09 | Discharge: 2020-05-09 | Disposition: A | Discharge status: Home / Self Care | Payer: Medicaid Other | Source: Ambulatory Visit | Attending: Orthopaedic Surgery | Admitting: Orthopaedic Surgery

## 2020-05-09 DIAGNOSIS — Z20822 Contact with and (suspected) exposure to covid-19: Secondary | ICD-10-CM | POA: Insufficient documentation

## 2020-05-09 DIAGNOSIS — Z01812 Encounter for preprocedural laboratory examination: Secondary | ICD-10-CM | POA: Insufficient documentation

## 2020-05-09 LAB — SARS CORONAVIRUS 2 (TAT 6-24 HRS): SARS Coronavirus 2: NEGATIVE

## 2020-05-09 NOTE — Anesthesia Preprocedure Evaluation (Addendum)
Anesthesia Evaluation  Patient identified by MRN, date of birth, ID band Patient awake    Reviewed: Allergy & Precautions, NPO status , Patient's Chart, lab work & pertinent test results  Airway Mallampati: II  TM Distance: >3 FB Neck ROM: Full    Dental  (+) Chipped,    Pulmonary    Pulmonary exam normal breath sounds clear to auscultation       Cardiovascular hypertension, Pt. on medications Normal cardiovascular exam Rhythm:Regular Rate:Normal     Neuro/Psych  Neuromuscular disease negative psych ROS   GI/Hepatic negative GI ROS, Neg liver ROS,   Endo/Other  diabetes, Well Controlled, Type 2, Oral Hypoglycemic AgentsHyperlipidemia  Renal/GU negative Renal ROS  negative genitourinary   Musculoskeletal Chronic LBP with left sided sciatica   Abdominal   Peds  Hematology   Anesthesia Other Findings   Reproductive/Obstetrics                            Anesthesia Physical Anesthesia Plan  ASA: II  Anesthesia Plan: General   Post-op Pain Management:  Regional for Post-op pain   Induction: Intravenous  PONV Risk Score and Plan: 3 and Midazolam, Dexamethasone, Ondansetron and Treatment may vary due to age or medical condition  Airway Management Planned: Oral ETT  Additional Equipment:   Intra-op Plan:   Post-operative Plan: Extubation in OR  Informed Consent: I have reviewed the patients History and Physical, chart, labs and discussed the procedure including the risks, benefits and alternatives for the proposed anesthesia with the patient or authorized representative who has indicated his/her understanding and acceptance.     Dental advisory given  Plan Discussed with: CRNA and Surgeon  Anesthesia Plan Comments:        Anesthesia Quick Evaluation

## 2020-05-10 ENCOUNTER — Other Ambulatory Visit: Payer: Self-pay

## 2020-05-10 ENCOUNTER — Ambulatory Visit (HOSPITAL_BASED_OUTPATIENT_CLINIC_OR_DEPARTMENT_OTHER)
Admission: RE | Admit: 2020-05-10 | Discharge: 2020-05-10 | Disposition: A | Discharge status: Home / Self Care | Payer: Medicaid Other | Attending: Orthopaedic Surgery | Admitting: Orthopaedic Surgery

## 2020-05-10 ENCOUNTER — Ambulatory Visit (HOSPITAL_BASED_OUTPATIENT_CLINIC_OR_DEPARTMENT_OTHER): Payer: Medicaid Other | Admitting: Certified Registered"

## 2020-05-10 ENCOUNTER — Encounter (HOSPITAL_BASED_OUTPATIENT_CLINIC_OR_DEPARTMENT_OTHER): Payer: Self-pay | Admitting: Orthopaedic Surgery

## 2020-05-10 ENCOUNTER — Encounter (HOSPITAL_BASED_OUTPATIENT_CLINIC_OR_DEPARTMENT_OTHER)
Admission: RE | Disposition: A | Discharge status: Home / Self Care | Payer: Self-pay | Source: Home / Self Care | Attending: Orthopaedic Surgery

## 2020-05-10 DIAGNOSIS — S46112A Strain of muscle, fascia and tendon of long head of biceps, left arm, initial encounter: Secondary | ICD-10-CM | POA: Insufficient documentation

## 2020-05-10 DIAGNOSIS — X58XXXA Exposure to other specified factors, initial encounter: Secondary | ICD-10-CM | POA: Insufficient documentation

## 2020-05-10 DIAGNOSIS — Z79899 Other long term (current) drug therapy: Secondary | ICD-10-CM | POA: Diagnosis not present

## 2020-05-10 DIAGNOSIS — S46812A Strain of other muscles, fascia and tendons at shoulder and upper arm level, left arm, initial encounter: Secondary | ICD-10-CM | POA: Diagnosis present

## 2020-05-10 DIAGNOSIS — I10 Essential (primary) hypertension: Secondary | ICD-10-CM | POA: Insufficient documentation

## 2020-05-10 DIAGNOSIS — Z7984 Long term (current) use of oral hypoglycemic drugs: Secondary | ICD-10-CM | POA: Diagnosis not present

## 2020-05-10 DIAGNOSIS — E119 Type 2 diabetes mellitus without complications: Secondary | ICD-10-CM | POA: Diagnosis not present

## 2020-05-10 DIAGNOSIS — S46012A Strain of muscle(s) and tendon(s) of the rotator cuff of left shoulder, initial encounter: Secondary | ICD-10-CM | POA: Insufficient documentation

## 2020-05-10 DIAGNOSIS — Z791 Long term (current) use of non-steroidal anti-inflammatories (NSAID): Secondary | ICD-10-CM | POA: Insufficient documentation

## 2020-05-10 HISTORY — DX: Pain in left shoulder: M25.512

## 2020-05-10 HISTORY — PX: SHOULDER OPEN ROTATOR CUFF REPAIR: SHX2407

## 2020-05-10 HISTORY — DX: Type 2 diabetes mellitus without complications: E11.9

## 2020-05-10 LAB — GLUCOSE, CAPILLARY: Glucose-Capillary: 104 mg/dL — ABNORMAL HIGH (ref 70–99)

## 2020-05-10 SURGERY — REPAIR, ROTATOR CUFF, OPEN
Anesthesia: General | Site: Shoulder | Laterality: Left

## 2020-05-10 MED ORDER — ONDANSETRON HCL 4 MG/2ML IJ SOLN
INTRAMUSCULAR | Status: DC | PRN
  Administered 2020-05-10: 4 mg via INTRAVENOUS

## 2020-05-10 MED ORDER — LACTATED RINGERS IV SOLN: INTRAVENOUS | Status: DC

## 2020-05-10 MED ORDER — BUPIVACAINE HCL (PF) 0.25 % IJ SOLN
INTRAMUSCULAR | Status: AC
  Filled 2020-05-10: qty 30

## 2020-05-10 MED ORDER — MIDAZOLAM HCL 2 MG/2ML IJ SOLN
1.0000 mg | Freq: Once | INTRAMUSCULAR | Status: AC
  Administered 2020-05-10: 2 mg via INTRAVENOUS

## 2020-05-10 MED ORDER — ROCURONIUM BROMIDE 10 MG/ML (PF) SYRINGE
PREFILLED_SYRINGE | INTRAVENOUS | Status: AC
  Filled 2020-05-10: qty 10

## 2020-05-10 MED ORDER — FENTANYL CITRATE (PF) 100 MCG/2ML IJ SOLN
50.0000 ug | Freq: Once | INTRAMUSCULAR | Status: AC
  Administered 2020-05-10: 100 ug via INTRAVENOUS

## 2020-05-10 MED ORDER — FENTANYL CITRATE (PF) 100 MCG/2ML IJ SOLN: 25.0000 ug | INTRAMUSCULAR | Status: DC | PRN

## 2020-05-10 MED ORDER — PROPOFOL 500 MG/50ML IV EMUL
INTRAVENOUS | Status: AC
  Filled 2020-05-10: qty 50

## 2020-05-10 MED ORDER — MIDAZOLAM HCL 2 MG/2ML IJ SOLN
INTRAMUSCULAR | Status: AC
  Filled 2020-05-10: qty 2

## 2020-05-10 MED ORDER — OXYCODONE HCL 5 MG PO TABS
ORAL_TABLET | ORAL | Status: AC
  Filled 2020-05-10: qty 1

## 2020-05-10 MED ORDER — SUGAMMADEX SODIUM 200 MG/2ML IV SOLN
INTRAVENOUS | Status: DC | PRN
  Administered 2020-05-10: 200 mg via INTRAVENOUS

## 2020-05-10 MED ORDER — OXYCODONE HCL 5 MG/5ML PO SOLN: 5.0000 mg | Freq: Once | ORAL | Status: AC | PRN

## 2020-05-10 MED ORDER — PHENYLEPHRINE 40 MCG/ML (10ML) SYRINGE FOR IV PUSH (FOR BLOOD PRESSURE SUPPORT)
PREFILLED_SYRINGE | INTRAVENOUS | Status: AC
  Filled 2020-05-10: qty 10

## 2020-05-10 MED ORDER — CEFAZOLIN SODIUM-DEXTROSE 2-4 GM/100ML-% IV SOLN
INTRAVENOUS | Status: AC
  Filled 2020-05-10: qty 100

## 2020-05-10 MED ORDER — ONDANSETRON HCL 4 MG PO TABS: 4.0000 mg | ORAL_TABLET | Freq: Three times a day (TID) | ORAL | 0 refills | Status: DC | PRN

## 2020-05-10 MED ORDER — DEXAMETHASONE SODIUM PHOSPHATE 10 MG/ML IJ SOLN
INTRAMUSCULAR | Status: DC | PRN
  Administered 2020-05-10: 4 mg via INTRAVENOUS

## 2020-05-10 MED ORDER — ONDANSETRON HCL 4 MG/2ML IJ SOLN: 4.0000 mg | Freq: Once | INTRAMUSCULAR | Status: DC | PRN

## 2020-05-10 MED ORDER — EPHEDRINE 5 MG/ML INJ
INTRAVENOUS | Status: AC
  Filled 2020-05-10: qty 10

## 2020-05-10 MED ORDER — MEPERIDINE HCL 25 MG/ML IJ SOLN: 6.2500 mg | INTRAMUSCULAR | Status: DC | PRN

## 2020-05-10 MED ORDER — LIDOCAINE 2% (20 MG/ML) 5 ML SYRINGE
INTRAMUSCULAR | Status: AC
  Filled 2020-05-10: qty 5

## 2020-05-10 MED ORDER — FENTANYL CITRATE (PF) 100 MCG/2ML IJ SOLN
INTRAMUSCULAR | Status: AC
  Filled 2020-05-10: qty 2

## 2020-05-10 MED ORDER — METHOCARBAMOL 750 MG PO TABS
750.0000 mg | ORAL_TABLET | Freq: Two times a day (BID) | ORAL | 3 refills | Status: DC | PRN
Start: 2020-05-10 — End: 2021-07-19

## 2020-05-10 MED ORDER — LIDOCAINE 2% (20 MG/ML) 5 ML SYRINGE
INTRAMUSCULAR | Status: DC | PRN
  Administered 2020-05-10: 80 mg via INTRAVENOUS
  Administered 2020-05-10: 20 mg via INTRAVENOUS

## 2020-05-10 MED ORDER — BUPIVACAINE-EPINEPHRINE (PF) 0.5% -1:200000 IJ SOLN
INTRAMUSCULAR | Status: DC | PRN
  Administered 2020-05-10: 20 mL via PERINEURAL

## 2020-05-10 MED ORDER — CEFAZOLIN SODIUM-DEXTROSE 2-4 GM/100ML-% IV SOLN
2.0000 g | INTRAVENOUS | Status: AC
  Administered 2020-05-10: 2 g via INTRAVENOUS

## 2020-05-10 MED ORDER — PROPOFOL 10 MG/ML IV BOLUS
INTRAVENOUS | Status: DC | PRN
  Administered 2020-05-10: 180 mg via INTRAVENOUS

## 2020-05-10 MED ORDER — OXYCODONE-ACETAMINOPHEN 5-325 MG PO TABS: 1.0000 | ORAL_TABLET | Freq: Three times a day (TID) | ORAL | 0 refills | Status: DC | PRN

## 2020-05-10 MED ORDER — OXYCODONE HCL 5 MG PO TABS
5.0000 mg | ORAL_TABLET | Freq: Once | ORAL | Status: AC | PRN
  Administered 2020-05-10: 5 mg via ORAL

## 2020-05-10 MED ORDER — BUPIVACAINE LIPOSOME 1.3 % IJ SUSP
INTRAMUSCULAR | Status: DC | PRN
Start: 2020-05-10 — End: 2020-05-10
  Administered 2020-05-10: 10 mL via PERINEURAL

## 2020-05-10 MED ORDER — DEXAMETHASONE SODIUM PHOSPHATE 10 MG/ML IJ SOLN
INTRAMUSCULAR | Status: AC
  Filled 2020-05-10: qty 1

## 2020-05-10 MED ORDER — EPHEDRINE SULFATE 50 MG/ML IJ SOLN
INTRAMUSCULAR | Status: DC | PRN
  Administered 2020-05-10 (×5): 5 mg via INTRAVENOUS

## 2020-05-10 MED ORDER — SODIUM CHLORIDE 0.9 % IR SOLN
Status: DC | PRN
  Administered 2020-05-10: 1000 mL

## 2020-05-10 MED ORDER — BUPIVACAINE-EPINEPHRINE (PF) 0.5% -1:200000 IJ SOLN: INTRAMUSCULAR | Status: DC | PRN

## 2020-05-10 MED ORDER — ONDANSETRON HCL 4 MG/2ML IJ SOLN
INTRAMUSCULAR | Status: AC
  Filled 2020-05-10: qty 2

## 2020-05-10 MED ORDER — ROCURONIUM BROMIDE 100 MG/10ML IV SOLN
INTRAVENOUS | Status: DC | PRN
  Administered 2020-05-10: 60 mg via INTRAVENOUS

## 2020-05-10 MED ORDER — FENTANYL CITRATE (PF) 100 MCG/2ML IJ SOLN
INTRAMUSCULAR | Status: DC | PRN
  Administered 2020-05-10: 100 ug via INTRAVENOUS

## 2020-05-10 SURGICAL SUPPLY — 80 items
ADH SKN CLS APL DERMABOND .7 (GAUZE/BANDAGES/DRESSINGS)
ANCH SUT CRKSW FT 1.3X (Anchor) ×3 IMPLANT
ANCH SUT SWLK 19.1X4.75 (Anchor) ×1 IMPLANT
ANCHOR SUT BIO SW 4.75X19.1 (Anchor) ×2 IMPLANT
ANCHOR SUT BIOCOMP CORKSREW (Anchor) ×6 IMPLANT
APL SKNCLS STERI-STRIP NONHPOA (GAUZE/BANDAGES/DRESSINGS)
BENZOIN TINCTURE PRP APPL 2/3 (GAUZE/BANDAGES/DRESSINGS) IMPLANT
BLADE EXCALIBUR 4.0X13 (MISCELLANEOUS) IMPLANT
BLADE SURG 15 STRL LF DISP TIS (BLADE) ×1 IMPLANT
BLADE SURG 15 STRL SS (BLADE) ×2
BNDG COHESIVE 4X5 TAN STRL (GAUZE/BANDAGES/DRESSINGS) IMPLANT
BURR OVAL 8 FLU 4.0X13 (MISCELLANEOUS) IMPLANT
CANNULA 5.75X71 LONG (CANNULA) IMPLANT
CANNULA SHOULDER 7CM (CANNULA) IMPLANT
CANNULA TWIST IN 8.25X7CM (CANNULA) IMPLANT
COVER WAND RF STERILE (DRAPES) IMPLANT
DECANTER SPIKE VIAL GLASS SM (MISCELLANEOUS) IMPLANT
DERMABOND ADVANCED (GAUZE/BANDAGES/DRESSINGS)
DERMABOND ADVANCED .7 DNX12 (GAUZE/BANDAGES/DRESSINGS) IMPLANT
DISSECTOR  3.8MM X 13CM (MISCELLANEOUS)
DISSECTOR 3.8MM X 13CM (MISCELLANEOUS) IMPLANT
DRAPE HALF SHEET 70X43 (DRAPES) ×2 IMPLANT
DRAPE IMP U-DRAPE 54X76 (DRAPES) ×2 IMPLANT
DRAPE INCISE IOBAN 66X45 STRL (DRAPES) ×1 IMPLANT
DRAPE STERI 35X30 U-POUCH (DRAPES) IMPLANT
DRAPE SURG 17X23 STRL (DRAPES) ×2 IMPLANT
DRAPE U-SHAPE 47X51 STRL (DRAPES) ×2 IMPLANT
DRAPE U-SHAPE 76X120 STRL (DRAPES) ×4 IMPLANT
DRSG PAD ABDOMINAL 8X10 ST (GAUZE/BANDAGES/DRESSINGS) ×2 IMPLANT
DURAPREP 26ML APPLICATOR (WOUND CARE) ×2 IMPLANT
DW OUTFLOW CASSETTE/TUBE SET (MISCELLANEOUS) IMPLANT
ELECT REM PT RETURN 9FT ADLT (ELECTROSURGICAL) ×2
ELECTRODE REM PT RTRN 9FT ADLT (ELECTROSURGICAL) ×1 IMPLANT
FIBER TAPE 2MM (SUTURE) IMPLANT
GAUZE SPONGE 4X4 12PLY STRL (GAUZE/BANDAGES/DRESSINGS) ×2 IMPLANT
GAUZE XEROFORM 1X8 LF (GAUZE/BANDAGES/DRESSINGS) IMPLANT
GLOVE BIOGEL PI IND STRL 6.5 (GLOVE) ×1 IMPLANT
GLOVE BIOGEL PI IND STRL 7.0 (GLOVE) ×1 IMPLANT
GLOVE BIOGEL PI INDICATOR 6.5 (GLOVE) ×1
GLOVE BIOGEL PI INDICATOR 7.0 (GLOVE) ×2
GLOVE ECLIPSE 6.5 STRL STRAW (GLOVE) ×4 IMPLANT
GLOVE ECLIPSE 7.0 STRL STRAW (GLOVE) ×4 IMPLANT
GLOVE SKINSENSE NS SZ7.5 (GLOVE) ×1
GLOVE SKINSENSE STRL SZ7.5 (GLOVE) ×1 IMPLANT
GLOVE SURG SYN 7.5  E (GLOVE) ×4
GLOVE SURG SYN 7.5 E (GLOVE) ×2 IMPLANT
GOWN STRL REIN XL XLG (GOWN DISPOSABLE) ×2 IMPLANT
GOWN STRL REUS W/ TWL LRG LVL3 (GOWN DISPOSABLE) ×2 IMPLANT
GOWN STRL REUS W/ TWL XL LVL3 (GOWN DISPOSABLE) ×1 IMPLANT
GOWN STRL REUS W/TWL LRG LVL3 (GOWN DISPOSABLE) ×4
GOWN STRL REUS W/TWL XL LVL3 (GOWN DISPOSABLE) ×2
MANIFOLD NEPTUNE II (INSTRUMENTS) ×2 IMPLANT
NEEDLE SCORPION MULTI FIRE (NEEDLE) ×2 IMPLANT
PACK BASIN DAY SURGERY FS (CUSTOM PROCEDURE TRAY) ×2 IMPLANT
PACK DSU ARTHROSCOPY (CUSTOM PROCEDURE TRAY) ×2 IMPLANT
PORT APPOLLO RF 90DEGREE MULTI (SURGICAL WAND) IMPLANT
SHEET MEDIUM DRAPE 40X70 STRL (DRAPES) ×4 IMPLANT
SLEEVE SCD COMPRESS KNEE MED (MISCELLANEOUS) ×2 IMPLANT
SLING ARM FOAM STRAP LRG (SOFTGOODS) IMPLANT
SLING ARM IMMOBILIZER LRG (SOFTGOODS) ×2 IMPLANT
STRIP CLOSURE SKIN 1/2X4 (GAUZE/BANDAGES/DRESSINGS) ×2 IMPLANT
SUT ETHILON 3 0 PS 1 (SUTURE) IMPLANT
SUT FIBERWIRE #2 38 T-5 BLUE (SUTURE)
SUT MNCRL AB 4-0 PS2 18 (SUTURE) ×2 IMPLANT
SUT PDS AB 1 CT  36 (SUTURE)
SUT PDS AB 1 CT 36 (SUTURE) IMPLANT
SUT TIGER TAPE 7 IN WHITE (SUTURE) IMPLANT
SUT VIC AB 2-0 CT1 27 (SUTURE) ×6
SUT VIC AB 2-0 CT1 TAPERPNT 27 (SUTURE) ×3 IMPLANT
SUTURE FIBERWR #2 38 T-5 BLUE (SUTURE) IMPLANT
SUTURE TAPE 1.3 40 TPR END (SUTURE) IMPLANT
SUTURE TAPE TIGERLINK 1.3MM BL (SUTURE) IMPLANT
SUTURETAPE 1.3 40 TPR END (SUTURE)
SUTURETAPE TIGERLINK 1.3MM BL (SUTURE)
SYR 50ML LL SCALE MARK (SYRINGE) IMPLANT
SYR BULB IRRIG 60ML STRL (SYRINGE) ×2 IMPLANT
TOWEL GREEN STERILE FF (TOWEL DISPOSABLE) ×2 IMPLANT
TUBE CONNECTING 20X1/4 (TUBING) IMPLANT
TUBING ARTHROSCOPY IRRIG 16FT (MISCELLANEOUS) IMPLANT
WATER STERILE IRR 1000ML POUR (IV SOLUTION) IMPLANT

## 2020-05-10 NOTE — Anesthesia Postprocedure Evaluation (Signed)
Anesthesia Post Note  Patient: Joe Baugh Simons Sr.  Procedure(s) Performed: LEFT SHOULDER OPEN ROTATOR CUFF REPAIR AND BICEPS TENODESIS (Left Shoulder)     Patient location during evaluation: PACU Anesthesia Type: General Level of consciousness: awake and alert and oriented Pain management: pain level controlled Vital Signs Assessment: post-procedure vital signs reviewed and stable Respiratory status: spontaneous breathing, nonlabored ventilation and respiratory function stable Cardiovascular status: blood pressure returned to baseline and stable Postop Assessment: no apparent nausea or vomiting Anesthetic complications: no   No complications documented.  Last Vitals:  Vitals:   05/10/20 1045 05/10/20 1100  BP: (!) 155/99 (!) 138/97  Pulse: 93 89  Resp: 18 18  Temp: 36.4 C   SpO2: 100% 100%    Last Pain:  Vitals:   05/10/20 0705  TempSrc: Oral  PainSc: 0-No pain                 Nuri Larmer A.

## 2020-05-10 NOTE — Transfer of Care (Signed)
Immediate Anesthesia Transfer of Care Note  Patient: Joe Groot Lamora Sr.  Procedure(s) Performed: LEFT SHOULDER OPEN ROTATOR CUFF REPAIR AND BICEPS TENODESIS (Left Shoulder)  Patient Location: PACU  Anesthesia Type:GA combined with regional for post-op pain  Level of Consciousness: awake, alert  and oriented  Airway & Oxygen Therapy: Patient Spontanous Breathing and Patient connected to face mask oxygen  Post-op Assessment: Report given to RN and Post -op Vital signs reviewed and stable  Post vital signs: Reviewed and stable  Last Vitals:  Vitals Value Taken Time  BP    Temp    Pulse 91 05/10/20 1048  Resp 15 05/10/20 1050  SpO2 100 % 05/10/20 1048  Vitals shown include unvalidated device data.  Last Pain:  Vitals:   05/10/20 0705  TempSrc: Oral  PainSc: 0-No pain         Complications: No complications documented.

## 2020-05-10 NOTE — H&P (Signed)
PREOPERATIVE H&P  Chief Complaint: left subscapularis and supraspinatus tears  HPI: Joe WHITMIRE Sr. is a 59 y.o. male who presents for surgical treatment of left subscapularis and supraspinatus tears.  He denies any changes in medical history.  Past Medical History:  Diagnosis Date  . Diabetes mellitus without complication ([place])   . Hypertension 2016  . Left shoulder pain   . Mass of pancreas 03/11/2019  . Microcytic anemia   . 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 COLONOSCOPY WITH PROPOFOL N/A 11/08/2018   Procedure: COLONOSCOPY WITH PROPOFOL;  Surgeon: Lavena Bullion, DO;  Location: [facility];  Service: Gastroenterology;  Laterality: N/A;  . ESOPHAGOGASTRODUODENOSCOPY (EGD) WITH PROPOFOL N/A 11/06/2018   Procedure: ESOPHAGOGASTRODUODENOSCOPY (EGD) WITH PROPOFOL;  Surgeon: Carol Ada, MD;  Location: [facility];  Service: Endoscopy;  Laterality: N/A;  . EUS  11/06/2018   Procedure: FULL UPPER ENDOSCOPIC ULTRASOUND (EUS) RADIAL;  Surgeon: Carol Ada, MD;  Location: [facility];  Service: Endoscopy;;  . GIVENS CAPSULE STUDY N/A 11/08/2018   Procedure: GIVENS CAPSULE STUDY;  Surgeon: Lavena Bullion, DO;  Location: [facility];  Service: Gastroenterology;  Laterality: N/A;  . IR ANGIOGRAM SELECTIVE EACH ADDITIONAL VESSEL  11/10/2018  . IR ANGIOGRAM VISCERAL SELECTIVE  11/10/2018  . IR ANGIOGRAM VISCERAL SELECTIVE  11/10/2018  . IR EMBO ART  VEN HEMORR LYMPH EXTRAV  INC GUIDE ROADMAPPING  11/10/2018  . IR US GUIDE VASC ACCESS RIGHT  11/10/2018  . KNEE SURGERY Bilateral 1985   Arthroscopic for torn cartilage and ligaments.     Social History   Socioeconomic History  . Marital status: Soil scientist    Spouse name: [name]  . Number of children: 2  . Years of education: 69  . Highest education level: 12th grade  Occupational History  . Occupation: Packing/stacking/loading     Comment: soaps, shaving implements, etc.  Tobacco Use  . Smoking status: Never Smoker  . Smokeless tobacco: Never Used  Vaping Use  . Vaping Use: Never used  Substance and Sexual Activity  . Alcohol use: Yes    Comment: 6 pack of beer daily  . Drug use: No  . Sexual activity: Yes  Other Topics Concern  . Not on file  Social History Narrative   Works second shift at [employer] with his partner of 30+ years and 61 yo daughter, [name].      Social Determinants of Health   Financial Resource Strain:   . Difficulty of Paying Living Expenses:   Food Insecurity:   . Worried About Charity fundraiser in the Last Year:   . Arboriculturist in the Last Year:   Transportation Needs:   . Film/video editor (Medical):   Marland Kitchen Lack of Transportation (Non-Medical):   Physical Activity:   . Days of Exercise per Week:   . Minutes of Exercise per Session:   Stress:   . Feeling of Stress :   Social Connections:   . Frequency of Communication with Friends and Family:   . Frequency of Social Gatherings with Friends and Family:   . Attends Religious Services:   . Active Member of Clubs or Organizations:   . Attends Archivist Meetings:   Marland Kitchen Marital Status:    Family History  Problem Relation Age of Onset  . Cancer Mother        uncertain what was  wrong--mother would not share, but gradual decline.  . Hypertension Mother   . Hypertension Father    Allergies  Allergen Reactions  . Hctz [Hydrochlorothiazide] Rash   Prior to Admission medications   Medication Sig Start Date End Date Taking? Authorizing Provider  ferrous sulfate 325 (65 FE) MG tablet Take 1 tablet (325 mg total) by mouth daily with breakfast. 10/16/19  Yes Lajean Saver, MD  losartan (COZAAR) 50 MG tablet Take 1 tablet (50 mg total) by mouth daily. To lower blood pressure 03/16/20  Yes McClung, Dionne Bucy, PA-C  meloxicam (MOBIC) 15 MG tablet Take 1 tablet (15 mg total) by mouth daily. Prn pain  03/16/20  Yes Argentina Donovan, PA-C  metFORMIN (GLUCOPHAGE) 500 MG tablet 1 pill daily after the evening meal to help with blood sugar control 03/16/20  Yes McClung, Angela M, PA-C  methocarbamol (ROBAXIN) 500 MG tablet Take 2 tablets (1,000 mg total) by mouth every 8 (eight) hours as needed for muscle spasms. 03/16/20  Yes Argentina Donovan, PA-C  Blood Pressure Monitor KIT Used to monitor blood pressure daily and as directed 04/14/19   Fulp, Cammie, MD     Positive ROS: All other systems have been reviewed and were otherwise negative with the exception of those mentioned in the HPI and as above.  Physical Exam: General: Alert, no acute distress Cardiovascular: No pedal edema Respiratory: No cyanosis, no use of accessory musculature GI: abdomen soft Skin: No lesions in the area of chief complaint Neurologic: Sensation intact distally Psychiatric: Patient is competent for consent with normal mood and affect Lymphatic: no lymphedema  MUSCULOSKELETAL: exam stable  Assessment: left subscapularis and supraspinatus tears  Plan: Plan for Procedure(s): LEFT SHOULDER OPEN ROTATOR CUFF REPAIR AND BICEPS TENODESIS  The risks benefits and alternatives were discussed with the patient including but not limited to the risks of nonoperative treatment, versus surgical intervention including infection, bleeding, nerve injury,  blood clots, cardiopulmonary complications, morbidity, mortality, among others, and they were willing to proceed.   Preoperative templating of the joint replacement has been completed, documented, and submitted to the Operating Room personnel in order to optimize intra-operative equipment management.   Eduard Roux, MD 05/10/2020 7:22 AM

## 2020-05-10 NOTE — Discharge Instructions (Signed)
Post-operative patient instructions  Shoulder Arthroscopy   . Ice:  Place intermittent ice or cooler pack over your shoulder, 30 minutes on and 30 minutes off.  Continue this for the first 72 hours after surgery, then save ice for use after therapy sessions or on more active days.   . Weight:  You may NOT bear weight on your arm as your symptoms allow. . Motion:  Do NOT perform gentle shoulder motion  . Dressing:  Perform 1st dressing change at 2 days postoperative. A moderate amount of blood tinged drainage is to be expected.  So if you bleed through the dressing on the first or second day or if you have fevers, it is fine to change the dressing/check the wounds early and redress wound.  If it bleeds through again, or if the incisions are leaking frank blood, please call the office. May change dressing every 1-2 days thereafter to help watch wounds. Can purchase Tegaderm (or 82M Nexcare) water resistant dressings at local pharmacy / [facility]. . Shower:  Light shower is ok after 2 days.  Please take shower, NO bath. Recover with gauze and ace wrap to help keep wounds protected.   . Pain medication:  A narcotic pain medication has been prescribed.  Take as directed.  Typically you need narcotic pain medication more regularly during the first 3 to 5 days after surgery.  Decrease your use of the medication as the pain improves.  Narcotics can sometimes cause constipation, even after a few doses.  If you have problems with constipation, you can take an over the counter stool softener or light laxative.  If you have persistent problems, please notify your physician's office. Marland Kitchen Physical therapy: Additional activity guidelines to be provided by your physician or physical therapist at follow-up visits.  . Driving: Do not recommend driving x 2 weeks post surgical, especially if surgery performed on right side. Should not drive while taking narcotic pain medications. It typically takes at least 2 weeks to  restore sufficient neuromuscular function for normal reaction times for driving safety.  . Call [phone] for questions or problems. Evenings you will be forwarded to the hospital operator.  Ask for the orthopaedic physician on call. Please call if you experience:    o Redness, foul smelling, or persistent drainage from the surgical site  o worsening shoulder pain and swelling not responsive to medication  o any calf pain and or swelling of the lower leg  o temperatures greater than 101.5 F o other questions or concerns   Thank you for allowing [place] to be a part of your care.    Post Anesthesia Home Care Instructions  Activity: Get plenty of rest for the remainder of the day. A responsible individual must stay with you for 24 hours following the procedure.  For the next 24 hours, DO NOT: -Drive a car -Paediatric nurse -Drink alcoholic beverages -Take any medication unless instructed by your physician -Make any legal decisions or sign important papers.  Meals: Start with liquid foods such as gelatin or soup. Progress to regular foods as tolerated. Avoid greasy, spicy, heavy foods. If nausea and/or vomiting occur, drink only clear liquids until the nausea and/or vomiting subsides. Call your physician if vomiting continues.  Special Instructions/Symptoms: Your throat may feel dry or sore from the anesthesia or the breathing tube placed in your throat during surgery. If this causes discomfort, gargle with warm salt water. The discomfort should disappear within 24 hours.  If you had a  scopolamine patch placed behind your ear for the management of post- operative nausea and/or vomiting:  1. The medication in the patch is effective for 72 hours, after which it should be removed.  Wrap patch in a tissue and discard in the trash. Wash hands thoroughly with soap and water. 2. You may remove the patch earlier than 72 hours if you experience unpleasant side effects which may include dry  mouth, dizziness or visual disturbances. 3. Avoid touching the patch. Wash your hands with soap and water after contact with the patch.    Regional Anesthesia Blocks  1. Numbness or the inability to move the "blocked" extremity may last from 3-48 hours after placement. The length of time depends on the medication injected and your individual response to the medication. If the numbness is not going away after 48 hours, call your surgeon.  2. The extremity that is blocked will need to be protected until the numbness is gone and the  Strength has returned. Because you cannot feel it, you will need to take extra care to avoid injury. Because it may be weak, you may have difficulty moving it or using it. You may not know what position it is in without looking at it while the block is in effect.  3. For blocks in the legs and feet, returning to weight bearing and walking needs to be done carefully. You will need to wait until the numbness is entirely gone and the strength has returned. You should be able to move your leg and foot normally before you try and bear weight or walk. You will need someone to be with you when you first try to ensure you do not fall and possibly risk injury.  4. Bruising and tenderness at the needle site are common side effects and will resolve in a few days.  5. Persistent numbness or new problems with movement should be communicated to the surgeon or the [facility] [phone] [place] [phone]).   Information for Discharge Teaching: EXPAREL (bupivacaine liposome injectable suspension)   Your surgeon or anesthesiologist gave you EXPAREL(bupivacaine) to help control your pain after surgery.   EXPAREL is a local anesthetic that provides pain relief by numbing the tissue around the surgical site.  EXPAREL is designed to release pain medication over time and can control pain for up to 72 hours.  Depending on how you respond to EXPAREL,  you may require less pain medication during your recovery.  Possible side effects:  Temporary loss of sensation or ability to move in the area where bupivacaine was injected.  Nausea, vomiting, constipation  Rarely, numbness and tingling in your mouth or lips, lightheadedness, or anxiety may occur.  Call your doctor right away if you think you may be experiencing any of these sensations, or if you have other questions regarding possible side effects.  Follow all other discharge instructions given to you by your surgeon or nurse. Eat a healthy diet and drink plenty of water or other fluids.  If you return to the hospital for any reason within 96 hours following the administration of EXPAREL, it is important for health care providers to know that you have received this anesthetic. A teal colored band has been placed on your arm with the date, time and amount of EXPAREL you have received in order to alert and inform your health care providers. Please leave this armband in place for the full 96 hours following administration, and then you may remove the  band.

## 2020-05-10 NOTE — Op Note (Signed)
Date of Surgery: 05/10/2020  INDICATIONS: Joe [name] is a 59 y.o.-year-old male with left supraspinatus and subscapularis tears;  The patient did consent to the procedure after discussion of the risks and benefits.  PREOPERATIVE DIAGNOSIS:  1.  Full-thickness retracted tear of left subscapularis tendon 2.  Full-thickness retracted tear of left supraspinatus tendon 3.  Medially dislocated left biceps tendon long head  POSTOPERATIVE DIAGNOSIS: Same.  PROCEDURE:  1.  Open repair of left subscapularis tendon 2.  Open repair of left supraspinatus tendon 3.  Open biceps tenodesis  SURGEON: N. Eduard Roux, M.D.  ASSIST: Ciro Backer Inez, [place]; necessary for the timely completion of procedure and due to complexity of procedure.  ANESTHESIA:  general, regional block  IV FLUIDS AND URINE: See anesthesia.  ESTIMATED BLOOD LOSS: 50 mL.  IMPLANTS: Arthrex 4.0 corkscrew x 3, Arthrex 4.75 mm swivel lock  DRAINS: None  COMPLICATIONS: see description of procedure.  DESCRIPTION OF PROCEDURE: The patient was brought to the operating room.  The patient had been signed prior to the procedure and this was documented. The patient had the anesthesia placed by the anesthesiologist.  A time-out was performed to confirm that this was the correct patient, site, side and location.  Patient was placed in the beach chair position with all bony prominences well-padded and the cervical spine in neutral position.  The patient did receive antibiotics prior to the incision and was re-dosed during the procedure as needed at indicated intervals. The patient had the operative extremity prepped and draped in the standard surgical fashion.    A standard deltopectoral approach was utilized.  The cephalic vein was mobilized and retracted laterally to develop the interval between the deltoid and the pectoralis muscle.  The conjoined tendon was then palpated and the clavipectoral fascia was incised lateral to the  conjoined tendon.  A Covell retractor was placed deep to the deltoid and the conjoined tendon to obtain adequate visualization.  Reverse Homans were placed superiorly to retract the superior deltoid.  Blunt dissection was performed in the bicipital groove.  Through external and internal rotation I was able to find the severely degenerative biceps tendon that was medially dislocated.  The biceps tendon was then tenotomized as proximally as possible.  The edges were prepared for tenodesis.  I then found the remnant of the subscapularis tendon stump.  This was mobilized in order to allow excursion back to the lesser tuberosity.  The anterior humeral circumflex vessels were identified and cauterized.  The bony surface of the lesser tuberosity was prepared with a rondure back to subchondral bone.  I then repaired the subscapularis tendon in a single row fashion using a 4.0 mm corkscrew placed in the lesser tuberosity.  I was able to deliver the subscapularis tendon back to the lesser tuberosity.  I then tenodesed the biceps tendon in the bicipital groove using another 4.0 mm corkscrew.  I then turned my attention to the supraspinatus tendon repair.  The subacromial bursa was excised with tenotomy's.  The old scar tissue was excised as well.  The stump of the supraspinatus was identified and tenotomy's were used to mobilize the supraspinatus back to the greater tuberosity.  The greater tuberosity was prepared with a rondure back to bleeding bone.  A horizontal mattress double row repair of the supraspinatus was performed using a 4.0 mm corkscrew for the medial row and a 4.75 mm swivel lock for the lateral row.  Gentle range of motion of the shoulder demonstrated a watertight repair.  The surgical wound was then thoroughly irrigated.  Layer closure was performed.  Sterile dressings were applied.  Shoulder immobilizer was placed.  Patient tolerated the procedure well had no immediate complications.  POSTOPERATIVE PLAN:  Discharge home and follow-up in 2 weeks.  Azucena Cecil, MD 10:06 AM

## 2020-05-10 NOTE — Anesthesia Procedure Notes (Signed)
Procedure Name: Intubation Date/Time: 05/10/2020 8:40 AM Performed by: Lavonia Dana, CRNA Pre-anesthesia Checklist: Patient identified, Emergency Drugs available, Suction available and Patient being monitored Patient Re-evaluated:Patient Re-evaluated prior to induction Oxygen Delivery Method: Circle system utilized Preoxygenation: Pre-oxygenation with 100% oxygen Induction Type: IV induction Ventilation: Mask ventilation without difficulty Laryngoscope Size: Mac and 4 Grade View: Grade II Tube type: Oral Tube size: 7.5 mm Number of attempts: 1 Airway Equipment and Method: Stylet and Bite block Placement Confirmation: ETT inserted through vocal cords under direct vision,  positive ETCO2 and breath sounds checked- equal and bilateral Secured at: 23 cm Tube secured with: Tape Dental Injury: Teeth and Oropharynx as per pre-operative assessment

## 2020-05-10 NOTE — Progress Notes (Signed)
Assisted Dr. Foster with left, ultrasound guided, interscalene  block. Side rails up, monitors on throughout procedure. See vital signs in flow sheet. Tolerated Procedure well.  

## 2020-05-11 ENCOUNTER — Encounter (HOSPITAL_BASED_OUTPATIENT_CLINIC_OR_DEPARTMENT_OTHER): Payer: Self-pay | Admitting: Orthopaedic Surgery

## 2020-05-12 ENCOUNTER — Other Ambulatory Visit: Payer: Self-pay | Admitting: Family Medicine

## 2020-05-12 NOTE — Telephone Encounter (Signed)
Requested medication (s) are due for refill today:yes  Requested medication (s) are on the active medication list: yes  Last refill:  01/04/2020  Future visit scheduled: no  Notes to clinic:  review medication for refill  Last filled by different provider    Requested Prescriptions  Pending Prescriptions Disp Refills   ferrous sulfate 325 (65 FE) MG tablet [Pharmacy Med Name: IRON 65MG  TAB] 90 tablet 0    Sig: TAKE 1 TABLET BY MOUTH ONCE DAILY AFTER  EATING  FOR  ANEMIA  TREATMENT      Endocrinology:  Minerals - Iron Supplementation Failed - 05/12/2020  9:38 AM      Failed - HGB in normal range and within 360 days    Hemoglobin  Date Value Ref Range Status  03/16/2020 12.8 (L) 13.0 - 17.7 g/dL Final          Failed - Fe (serum) in normal range and within 360 days    Iron  Date Value Ref Range Status  10/16/2019 9 (L) 45 - 182 ug/dL Final   Saturation Ratios  Date Value Ref Range Status  10/16/2019 2 (L) 17.9 - 39.5 % Final          Failed - Ferritin in normal range and within 360 days    Ferritin  Date Value Ref Range Status  10/16/2019 5 (L) 24 - 336 ng/mL Final    Comment:    Performed at [facility], [place] [address], [place], [place] [address]          Passed - HCT in normal range and within 360 days    Hematocrit  Date Value Ref Range Status  03/16/2020 41.4 37.5 - 51.0 % Final          Passed - RBC in normal range and within 360 days    RBC  Date Value Ref Range Status  03/16/2020 5.53 4.14 - 5.80 x10E6/uL Final  10/16/2019 3.35 (L) 4.22 - 5.81 MIL/uL Final   RBC.  Date Value Ref Range Status  10/16/2019 3.52 (L) 4.22 - 5.81 MIL/uL Final          Passed - Valid encounter within last 12 months    Recent Outpatient Visits           1 month ago Acute pain of left shoulder   [place], Charlane Ferretti, MD   1 month ago Prediabetes   [facility], [place], [place]   6 months  ago Anemia due to GI blood loss   [place], [place]   7 months ago Essential hypertension   [facility], [place], [place]   1 year ago Essential hypertension   [place], [place]       Future Appointments             In 5 days Leandrew Koyanagi, MD [facility]

## 2020-05-17 ENCOUNTER — Inpatient Hospital Stay: Payer: Medicaid Other | Admitting: Orthopaedic Surgery

## 2020-05-19 ENCOUNTER — Encounter: Payer: Self-pay | Admitting: Physician Assistant

## 2020-05-19 ENCOUNTER — Ambulatory Visit (INDEPENDENT_AMBULATORY_CARE_PROVIDER_SITE_OTHER): Payer: Medicaid Other | Admitting: Physician Assistant

## 2020-05-19 VITALS — Ht 71.0 in | Wt 163.2 lb

## 2020-05-19 DIAGNOSIS — Z9889 Other specified postprocedural states: Secondary | ICD-10-CM

## 2020-05-19 NOTE — Progress Notes (Signed)
Post-Op Visit Note   Patient: Joe BERNS Sr.           Date of Birth: 26-Mar-1961           MRN: [id number] Visit Date: 05/19/2020 PCP: Antony Blackbird, MD   Assessment & Plan:  Chief Complaint:  Chief Complaint  Patient presents with  . Left Shoulder - Routine Post Op   Visit Diagnoses:  1. S/P left rotator cuff repair     Plan: Patient is a pleasant 59 year old gentleman who comes in today 1 week status post open repair left subscapularis and supraspinatus tendons date of surgery 05/10/2020.  He has been doing okay.  He has been compliant wearing his sling.  He is taking narcotic pain medication as needed.  No fevers or chills.  Examination of his left shoulder reveals a well-healing surgical incision.  No evidence of infection or cellulitis.  Fingers are warm and well-perfused.  At this point, he will continue to wear his sling for another 5 weeks.  He will follow-up with [place] at that point for recheck and to likely start formal physical therapy.  Call with concerns or questions in meantime.  Follow-Up Instructions: Return in about 5 weeks (around 06/23/2020).   Orders:  No orders of the defined types were placed in this encounter.  No orders of the defined types were placed in this encounter.   Imaging: No new imaging  PMFS History: Patient Active Problem List   Diagnosis Date Noted  . Full thickness tear of left subscapularis tendon 04/12/2020  . Traumatic tear of supraspinatus tendon of left shoulder 04/12/2020  . 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 Mass of pancreas 03/11/2019  . Microcytic anemia   . 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 Hypertension 11/04/2014   Past Medical History:  Diagnosis Date  . Diabetes mellitus without complication ([place])   . Hypertension 2016  . Left shoulder pain   . Mass of pancreas 03/11/2019  . Microcytic anemia   . Pseudoaneurysm of intra-abdominal artery  ([place]) - gastroduodenal 03/11/2019    Family History  Problem Relation Age of Onset  . Cancer Mother        uncertain what was wrong--mother would not share, but gradual decline.  . Hypertension Mother   . Hypertension Father     Past Surgical History:  Procedure Laterality Date  . COLONOSCOPY WITH PROPOFOL N/A 11/08/2018   Procedure: COLONOSCOPY WITH PROPOFOL;  Surgeon: Lavena Bullion, DO;  Location: [facility];  Service: Gastroenterology;  Laterality: N/A;  . ESOPHAGOGASTRODUODENOSCOPY (EGD) WITH PROPOFOL N/A 11/06/2018   Procedure: ESOPHAGOGASTRODUODENOSCOPY (EGD) WITH PROPOFOL;  Surgeon: Carol Ada, MD;  Location: [facility];  Service: Endoscopy;  Laterality: N/A;  . EUS  11/06/2018   Procedure: FULL UPPER ENDOSCOPIC ULTRASOUND (EUS) RADIAL;  Surgeon: Carol Ada, MD;  Location: [facility];  Service: Endoscopy;;  . GIVENS CAPSULE STUDY N/A 11/08/2018   Procedure: GIVENS CAPSULE STUDY;  Surgeon: Lavena Bullion, DO;  Location: [facility];  Service: Gastroenterology;  Laterality: N/A;  . IR ANGIOGRAM SELECTIVE EACH ADDITIONAL VESSEL  11/10/2018  . IR ANGIOGRAM VISCERAL SELECTIVE  11/10/2018  . IR ANGIOGRAM VISCERAL SELECTIVE  11/10/2018  . IR EMBO ART  VEN HEMORR LYMPH EXTRAV  INC GUIDE ROADMAPPING  11/10/2018  . IR US GUIDE VASC ACCESS RIGHT  11/10/2018  . KNEE SURGERY Bilateral 1985   Arthroscopic for torn cartilage and ligaments.    Marland Kitchen SHOULDER OPEN ROTATOR  CUFF REPAIR Left 05/10/2020   Procedure: LEFT SHOULDER OPEN ROTATOR CUFF REPAIR AND BICEPS TENODESIS;  Surgeon: Leandrew Koyanagi, MD;  Location: [facility];  Service: Orthopedics;  Laterality: Left;   Social History   Occupational History  . Occupation: Packing/stacking/loading    Comment: soaps, shaving implements, etc.  Tobacco Use  . Smoking status: Never Smoker  . Smokeless tobacco: Never Used  Vaping Use  . Vaping Use: Never used  Substance and Sexual Activity  . Alcohol use: Yes    Comment: 6 pack of  beer daily  . Drug use: No  . Sexual activity: Yes

## 2020-05-19 NOTE — Addendum Note (Signed)
Addendum  created 05/19/20 1501 by Josephine Igo, MD   Intraprocedure Attestations deleted, Intraprocedure Staff edited

## 2020-06-23 ENCOUNTER — Ambulatory Visit (INDEPENDENT_AMBULATORY_CARE_PROVIDER_SITE_OTHER): Payer: Medicaid Other | Admitting: Physician Assistant

## 2020-06-23 ENCOUNTER — Encounter: Payer: Self-pay | Admitting: Physician Assistant

## 2020-06-23 DIAGNOSIS — Z9889 Other specified postprocedural states: Secondary | ICD-10-CM

## 2020-06-23 MED ORDER — HYDROCODONE-ACETAMINOPHEN 5-325 MG PO TABS: 1.0000 | ORAL_TABLET | Freq: Two times a day (BID) | ORAL | 0 refills | Status: DC | PRN

## 2020-06-23 NOTE — Progress Notes (Signed)
Post-Op Visit Note   Patient: Joe ROUTE Sr.           Date of Birth: [birth year]           MRN: [id number] Visit Date: 06/23/2020 PCP: Antony Blackbird, MD   Assessment & Plan:  Chief Complaint:  Chief Complaint  Patient presents with  . Left Shoulder - Pain   Visit Diagnoses:  1. S/P left rotator cuff repair     Plan: Patient is a pleasant 59 year old gentleman who comes in today 6 weeks open subscapularis and supraspinatus tendon repair left shoulder, date of surgery 05/10/2020.  He has been doing well.  He has minimal to moderate pain at times.  No fevers or chills.  He has been compliant wearing his sling.  At this point, he can discontinue his sling.  We will start him in physical therapy for rotator cuff repair protocol and an internal referral has been made.  I have sent in a prescription of Norco to use sparingly as needed with therapy.  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  He will follow-up with [place] in 6 weeks time for recheck.  Follow-Up Instructions: Return in about 6 weeks (around 08/04/2020).   Orders:  Orders Placed This Encounter  Procedures  . Ambulatory referral to Physical Therapy   Meds ordered this encounter  Medications  . HYDROcodone-acetaminophen (NORCO) 5-325 MG tablet    Sig: Take 1-2 tablets by mouth 2 (two) times daily as needed.    Dispense:  30 tablet    Refill:  0    Imaging: No new imaging  PMFS History: Patient Active Problem List   Diagnosis Date Noted  . Full thickness tear of left subscapularis tendon 04/12/2020  . Traumatic tear of supraspinatus tendon of left shoulder 04/12/2020  . 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 Mass of pancreas 03/11/2019  . Microcytic anemia   . 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 Hypertension 11/04/2014   Past Medical  History:  Diagnosis Date  . Diabetes mellitus without complication ([place])   . Hypertension 2016  . Left shoulder pain   . Mass of pancreas 03/11/2019  . Microcytic anemia   . 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 Cancer Mother        uncertain what was wrong--mother would not share, but gradual decline.  . Hypertension Mother   . Hypertension Father     Past Surgical History:  Procedure Laterality Date  . COLONOSCOPY WITH PROPOFOL N/A 11/08/2018   Procedure: COLONOSCOPY WITH PROPOFOL;  Surgeon: Lavena Bullion, DO;  Location: [facility];  Service: Gastroenterology;  Laterality: N/A;  . ESOPHAGOGASTRODUODENOSCOPY (EGD) WITH PROPOFOL N/A 11/06/2018   Procedure: ESOPHAGOGASTRODUODENOSCOPY (EGD) WITH PROPOFOL;  Surgeon: Carol Ada, MD;  Location: [facility];  Service: Endoscopy;  Laterality: N/A;  . EUS  11/06/2018   Procedure: FULL UPPER ENDOSCOPIC ULTRASOUND (EUS) RADIAL;  Surgeon: Carol Ada, MD;  Location: [facility];  Service: Endoscopy;;  . GIVENS CAPSULE STUDY N/A 11/08/2018   Procedure: GIVENS CAPSULE STUDY;  Surgeon: Lavena Bullion, DO;  Location: [facility];  Service: Gastroenterology;  Laterality: N/A;  . IR ANGIOGRAM SELECTIVE EACH ADDITIONAL VESSEL  11/10/2018  . IR ANGIOGRAM VISCERAL SELECTIVE  11/10/2018  . IR ANGIOGRAM VISCERAL SELECTIVE  11/10/2018  . IR EMBO ART  VEN HEMORR LYMPH EXTRAV  INC GUIDE ROADMAPPING  11/10/2018  . IR US GUIDE VASC ACCESS RIGHT  11/10/2018  . KNEE SURGERY Bilateral 1985   Arthroscopic for torn cartilage and ligaments.    Marland Kitchen SHOULDER OPEN ROTATOR CUFF REPAIR Left 05/10/2020   Procedure: LEFT SHOULDER OPEN ROTATOR CUFF REPAIR AND BICEPS TENODESIS;  Surgeon: Leandrew Koyanagi, MD;  Location: [facility];  Service: Orthopedics;  Laterality: Left;   Social History   Occupational History  . Occupation: Packing/stacking/loading    Comment: soaps,  shaving implements, etc.  Tobacco Use  . Smoking status: Never Smoker  . Smokeless tobacco: Never Used  Vaping Use  . Vaping Use: Never used  Substance and Sexual Activity  . Alcohol use: Yes    Comment: 6 pack of beer daily  . Drug use: No  . Sexual activity: Yes

## 2020-06-26 ENCOUNTER — Ambulatory Visit: Payer: Medicaid Other | Attending: Physician Assistant | Admitting: Physical Therapy

## 2020-06-26 ENCOUNTER — Other Ambulatory Visit: Payer: Self-pay

## 2020-06-26 ENCOUNTER — Encounter: Payer: Self-pay | Admitting: Physical Therapy

## 2020-06-26 DIAGNOSIS — R600 Localized edema: Secondary | ICD-10-CM

## 2020-06-26 DIAGNOSIS — M6281 Muscle weakness (generalized): Secondary | ICD-10-CM | POA: Insufficient documentation

## 2020-06-26 DIAGNOSIS — M25512 Pain in left shoulder: Secondary | ICD-10-CM | POA: Insufficient documentation

## 2020-06-26 DIAGNOSIS — M25611 Stiffness of right shoulder, not elsewhere classified: Secondary | ICD-10-CM

## 2020-06-26 NOTE — Patient Instructions (Signed)
Access Code: [email or web address]  Prepared by: Carolyne Littles  Exercises Seated Shoulder Flexion Towel Slide at Table Top - 2 x daily - 7 x weekly - 3 sets - 10 reps Supine Shoulder Flexion Extension AAROM with Dowel - 2 x daily - 7 x weekly - 3 sets - 10 reps Supine Shoulder External Rotation in 45 Degrees Abduction AAROM with Dowel - 2 x daily - 7 x weekly - 3 sets - 10 reps Seated Scapular Retraction - 2 x daily - 7 x weekly - 3 sets - 10 reps Circular Shoulder Pendulum with Table Support - 2 x daily - 7 x weekly - 3 sets - 10 reps

## 2020-06-26 NOTE — Therapy (Signed)
[place], [place], [address] Phone: [phone]   Fax:  [phone]  Physical Therapy Evaluation  Patient Details  Name: Joe WANAT Sr. MRN: [id number] Date of Birth: 1961/02/16 Referring Provider (PT): Tiney Rouge PA-C   Encounter Date: 06/26/2020   PT End of Session - 06/26/20 0941    Visit Number 1    Number of Visits 4   per mediciad   Date for PT Re-Evaluation 07/24/20    Authorization Type Mediciad    PT Start Time 0846    PT Stop Time 0927    PT Time Calculation (min) 41 min    Activity Tolerance Patient tolerated treatment well    Behavior During Therapy [facility] for tasks assessed/performed           Past Medical History:  Diagnosis Date  . Diabetes mellitus without complication ([place])   . Hypertension 2016  . Left shoulder pain   . Mass of pancreas 03/11/2019  . Microcytic anemia   . 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 COLONOSCOPY WITH PROPOFOL N/A 11/08/2018   Procedure: COLONOSCOPY WITH PROPOFOL;  Surgeon: Lavena Bullion, DO;  Location: [place] [facility] [facility];  Service: Gastroenterology;  Laterality: N/A;  . ESOPHAGOGASTRODUODENOSCOPY (EGD) WITH PROPOFOL N/A 11/06/2018   Procedure: ESOPHAGOGASTRODUODENOSCOPY (EGD) WITH PROPOFOL;  Surgeon: Carol Ada, MD;  Location: [place];  Service: Endoscopy;  Laterality: N/A;  . EUS  11/06/2018   Procedure: FULL UPPER ENDOSCOPIC ULTRASOUND (EUS) RADIAL;  Surgeon: Carol Ada, MD;  Location: [place];  Service: Endoscopy;;  . GIVENS CAPSULE STUDY N/A 11/08/2018   Procedure: GIVENS CAPSULE STUDY;  Surgeon: Lavena Bullion, DO;  Location: [place];  Service: Gastroenterology;  Laterality: N/A;  . IR ANGIOGRAM SELECTIVE EACH ADDITIONAL VESSEL  11/10/2018  . IR ANGIOGRAM VISCERAL SELECTIVE  11/10/2018  . IR ANGIOGRAM VISCERAL SELECTIVE  11/10/2018  . IR EMBO  ART  VEN HEMORR LYMPH EXTRAV  INC GUIDE ROADMAPPING  11/10/2018  . IR US GUIDE VASC ACCESS RIGHT  11/10/2018  . KNEE SURGERY Bilateral 1985   Arthroscopic for torn cartilage and ligaments.    Marland Kitchen SHOULDER OPEN ROTATOR CUFF REPAIR Left 05/10/2020   Procedure: LEFT SHOULDER OPEN ROTATOR CUFF REPAIR AND BICEPS TENODESIS;  Surgeon: Leandrew Koyanagi, MD;  Location: [place];  Service: Orthopedics;  Laterality: Left;    There were no vitals filed for this visit.    Subjective Assessment - 06/26/20 0851    Subjective Patient is S/P left RTC sub-scap and supraspinatus repair on 05/10/2020. At this time his pain is well controlled. He is out of the sling.    Pertinent History use of the shoulder    How long can you sit comfortably? N/A    How long can you stand comfortably? N/A    How long can you walk comfortably? N/A    Diagnostic tests nothing post -op    Patient Stated Goals to go back to work    Currently in Pain? Yes    Pain Score 4    with movement   Pain Location Shoulder    Pain Orientation Left    Pain Descriptors / Indicators Aching    Pain Type Chronic pain    Pain Onset More than a month ago    Pain Frequency Constant    Aggravating Factors  use of the arm    Pain Relieving Factors  rest    Effect of Pain on Daily Activities diffiulty perfroming ADL's              [facility] PT Assessment - 06/26/20 0001      Assessment   Medical Diagnosis Left RTC repair    Referring Provider (PT) Tiney Rouge PA-C    Onset Date/Surgical Date 05/10/20    Hand Dominance Right    Next MD Visit 6 week s    Prior Therapy None       Precautions   Precautions None      Restrictions   Weight Bearing Restrictions No      Balance Screen   Has the patient fallen in the past 6 months No    Has the patient had a decrease in activity level because of a fear of falling?  No    Is the patient reluctant to leave their home because of a fear of falling?  No      Home Environment    Additional Comments nothing significant       Prior Function   Level of Independence Independent    Vocation Full time employment    Vocation Requirements Needs to be able to lift 40 pound boxes up overhead     Leisure would like to get back to doing anything       Cognition   Overall Cognitive Status Within Functional Limits for tasks assessed    Attention Focused    Focused Attention Appears intact    Memory Appears intact    Awareness Appears intact    Problem Solving Appears intact      Observation/Other Assessments   Focus on Therapeutic Outcomes (FOTO)  Mediciad       Sensation   Light Touch Appears Intact    Additional Comments Denies parathesias       Coordination   Gross Motor Movements are Fluid and Coordinated Yes    Fine Motor Movements are Fluid and Coordinated Yes      ROM / Strength   AROM / PROM / Strength AROM;PROM;Strength      AROM   Overall AROM Comments Not tested 2nd to recent surgery       PROM   PROM Assessment Site Shoulder    Right/Left Shoulder Left    Left Shoulder Flexion 108 Degrees    Left Shoulder Internal Rotation 60 Degrees    Left Shoulder External Rotation 56 Degrees      Strength   Overall Strength Comments not tested 2nd to recent surgery       Palpation   Palpation comment tednere to palaption in the upper trap.                       Objective measurements completed on examination: See above findings.       [facility] PT Treatment/Exercise - 06/26/20 0001      Exercises   Exercises Shoulder      Shoulder Exercises: Supine   Other Supine Exercises wand flexion 2x10; wand er 2x10       Shoulder Exercises: Seated   Other Seated Exercises table slide 2x10       Shoulder Exercises: Standing   Other Standing Exercises pendulum x10 each direction       Manual Therapy   Manual Therapy Soft tissue mobilization;Joint mobilization;Passive ROM    Joint Mobilization gentle PA and AP mobilizations     Soft  tissue mobilization to upper trap  Passive ROM gentle PROM                   PT Education - 06/26/20 1245    Education Details HEP, expected progression and time frame    Person(s) Educated Patient    Methods Explanation;Demonstration;Tactile cues;Verbal cues    Comprehension Verbalized understanding;Returned demonstration;Verbal cues required;Tactile cues required            PT Short Term Goals - 06/26/20 1237      PT SHORT TERM GOAL #1   Title Patient will increase gross left shoulder strength to 4+/5    Baseline unable to assess 2nd to recent surgery    Time 3    Period Weeks    Status New    Target Date 07/17/20      PT SHORT TERM GOAL #2   Title Patient will be independent with basic HEP    Baseline has no HEP    Time 3    Period Weeks    Status New    Target Date 07/17/20      PT SHORT TERM GOAL #3   Title Patient will increase passive left shoulder flexion and Er to full    Baseline ER 60, flexion 90    Time 3    Period Weeks    Status New    Target Date 07/17/20             PT Long Term Goals - 06/26/20 1239      PT LONG TERM GOAL #1   Title Patient will place a box > 20 lbs overhead in order to return to work if cleared by MD    Baseline can not lift overhead    Time 6    Period Weeks    Status New    Target Date 08/07/20      PT LONG TERM GOAL #2   Title Patient will demonstrate full AROM with all motions in order to perfrom ADL's    Baseline AROM not tested 2nd to recent surgery but limited    Time 6    Period Weeks    Status New    Target Date 08/07/20                  Plan - 06/26/20 0942    Clinical Impression Statement Patient is a 59 year old male S/P left supraspinatus repair , subscap repair, and bicpes tenodesis. He rpesents with expected limitations in shoulder motion and strength. His passive ER is doing very well. His passive flexion is very good for his fist visit. he was given light active assistive  exercises but advised to be mindful of his symptoms at this time. Therapy ewill continue to progress as tolerated.    Personal Factors and Comorbidities Comorbidity 1;Profession    Comorbidities DMII    Examination-Activity Limitations Locomotion Level;Reach Overhead;Carry    Examination-Participation Restrictions Cleaning;Community Activity;Occupation    Stability/Clinical Decision Making Evolving/Moderate complexity    Clinical Decision Making Low    Rehab Potential Excellent    PT Frequency 1x / week    PT Duration 4 weeks   per mediciad restrictions   PT Treatment/Interventions ADLs/Self Care Home Management;Cryotherapy;Electrical Stimulation;Moist Heat;Iontophoresis 4mg /ml Dexamethasone;Ultrasound;DME Instruction;Functional mobility training;Therapeutic activities;Therapeutic exercise;Neuromuscular re-education;Patient/family education;Manual techniques;Passive range of motion;Taping    PT Next Visit Plan continue with PROM, assess tolerance to HEP, assess technique, consider scap strengthening, progress to Isometrics as able.    PT Home Exercise Plan pendulums, scap retraction, table  slide, wand flex, wand ER;Access Code: [email or web address] by: Shanon Brow CarrollExercisesSeated Shoulder Flexion Towel Slide at Table Top - 2 x daily - 7 x weekly - 3 sets - 10 repsSupine Shoulder Flexion Extension AAROM with Dowel - 2 x daily - 7 x weekly - 3 sets - 10 repsSupine Shoulder External Rotation in 45 Degrees Abduction AAROM with Dowel - 2 x daily - 7 x weekly - 3 sets - 10 repsSeated Scapular Retraction - 2 x daily - 7 x weekly - 3 sets - 10 repsCircular Shoulder Pendulum with Table Support - 2 x daily - 7 x weekly - 3 sets - 10 reps    Consulted and Agree with Plan of Care Patient           Patient will benefit from skilled therapeutic intervention in order to improve the following deficits and impairments:  Decreased endurance, Decreased mobility, Increased muscle spasms, Decreased activity tolerance,  Pain, Impaired UE functional use, Decreased range of motion, Increased edema  Visit Diagnosis: Acute pain of left shoulder - Plan: PT plan of care cert/re-cert  Stiffness of right shoulder, not elsewhere classified - Plan: PT plan of care cert/re-cert  Muscle weakness (generalized) - Plan: PT plan of care cert/re-cert  Localized edema - Plan: PT plan of care cert/re-cert     Problem List Patient Active Problem List   Diagnosis Date Noted  . Full thickness tear of left subscapularis tendon 04/12/2020  . Traumatic tear of supraspinatus tendon of left shoulder 04/12/2020  . 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 Mass of pancreas 03/11/2019  . Microcytic anemia   . 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 Hypertension 11/04/2014    Carney Living PT DPT 06/26/2020, 12:48 PM  [place] [facility] [facility] [address] [place], [place], [address] Phone: [phone]   Fax:  [phone]  Name: CLEON SIGNORELLI Sr. MRN: [id number] Date of Birth: [birth year]

## 2020-06-30 ENCOUNTER — Other Ambulatory Visit: Payer: Self-pay | Admitting: Family Medicine

## 2020-06-30 DIAGNOSIS — I10 Essential (primary) hypertension: Secondary | ICD-10-CM

## 2020-06-30 DIAGNOSIS — R7303 Prediabetes: Secondary | ICD-10-CM

## 2020-06-30 MED ORDER — METFORMIN HCL 500 MG PO TABS: ORAL_TABLET | ORAL | 0 refills | Status: DC

## 2020-06-30 MED ORDER — FERROUS SULFATE 325 (65 FE) MG PO TABS: 325.0000 mg | ORAL_TABLET | Freq: Every day | ORAL | 0 refills | Status: DC

## 2020-06-30 MED ORDER — LOSARTAN POTASSIUM 50 MG PO TABS: 50.0000 mg | ORAL_TABLET | Freq: Every day | ORAL | 0 refills | Status: DC

## 2020-06-30 NOTE — Telephone Encounter (Signed)
Medication: losartan (COZAAR) 50 MG tablet [[id number]] , ferrous sulfate 325 (65 FE) MG tablet [[id number]] , metFORMIN (GLUCOPHAGE) 500 MG tablet [[id number]]   Has the patient contacted their pharmacy? YES  (Agent: If no, request that the patient contact the pharmacy for the refill.) (Agent: If yes, when and what did the pharmacy advise?)  Preferred Pharmacy (with phone number or street name): [place] ([place]), [place] - [address]  Phone:  [phone] Fax:  [phone]     Agent: Please be advised that RX refills may take up to 3 business days. We ask that you follow-up with your pharmacy.

## 2020-06-30 NOTE — Telephone Encounter (Signed)
Requested medication (s) are due for refill today: yes  Requested medication (s) are on the active medication list: yes  Last refill:  05/12/2020  Future visit scheduled: no  Notes to clinic:  Patient has not schedule office visit    Requested Prescriptions  Pending Prescriptions Disp Refills   metFORMIN (GLUCOPHAGE) 500 MG tablet 90 tablet 1    Sig: 1 pill daily after the evening meal to help with blood sugar control      Endocrinology:  Diabetes - Biguanides Passed - 06/30/2020  8:40 AM      Passed - Cr in normal range and within 360 days    Creatinine, Ser  Date Value Ref Range Status  03/16/2020 0.84 0.76 - 1.27 mg/dL Final          Passed - HBA1C is between 0 and 7.9 and within 180 days    Hgb A1c MFr Bld  Date Value Ref Range Status  03/16/2020 5.8 (H) 4.8 - 5.6 % Final    Comment:             Prediabetes: 5.7 - 6.4          Diabetes: >6.4          Glycemic control for adults with diabetes: <7.0           Passed - eGFR in normal range and within 360 days    GFR calc Af Amer  Date Value Ref Range Status  03/16/2020 112 >59 mL/min/1.73 Final    Comment:    **[facility] currently reports eGFR in compliance with the current**   recommendations of the [employer]. [facility] will   update reporting as new guidelines are published from the NKF-ASN   Task force.    GFR calc non Af Amer  Date Value Ref Range Status  03/16/2020 97 >59 mL/min/1.73 Final   GFR  Date Value Ref Range Status  03/11/2019 99.85 >60.00 mL/min Final          Passed - Valid encounter within last 6 months    Recent Outpatient Visits           3 months ago Acute pain of left shoulder   [place], Charlane Ferretti, MD   3 months ago Prediabetes   [place], [place], [place]   8 months ago Anemia due to GI blood loss   [place], [place], MD   8 months ago Essential  hypertension   [place], [place], MD   1 year ago Essential hypertension   [place], [place], MD       Future Appointments             In 1 month Erlinda Hong, Marylynn Pearson, MD [facility] [place]              ferrous sulfate 325 (65 FE) MG tablet 30 tablet 0    Sig: Take 1 tablet (325 mg total) by mouth daily with breakfast. Please make PCP appointment.      Endocrinology:  Minerals - Iron Supplementation Failed - 06/30/2020  8:40 AM      Failed - HGB in normal range and within 360 days    Hemoglobin  Date Value Ref Range Status  03/16/2020 12.8 (L) 13.0 - 17.7 g/dL Final          Failed - Fe (serum)  in normal range and within 360 days    Iron  Date Value Ref Range Status  10/16/2019 9 (L) 45 - 182 ug/dL Final   Saturation Ratios  Date Value Ref Range Status  10/16/2019 2 (L) 17.9 - 39.5 % Final          Failed - Ferritin in normal range and within 360 days    Ferritin  Date Value Ref Range Status  10/16/2019 5 (L) 24 - 336 ng/mL Final    Comment:    Performed at [facility], [place] [address] [place], [place] [id number]          Passed - HCT in normal range and within 360 days    Hematocrit  Date Value Ref Range Status  03/16/2020 41.4 37.5 - 51.0 % Final          Passed - RBC in normal range and within 360 days    RBC  Date Value Ref Range Status  03/16/2020 5.53 4.14 - 5.80 x10E6/uL Final  10/16/2019 3.35 (L) 4.22 - 5.81 MIL/uL Final   RBC.  Date Value Ref Range Status  10/16/2019 3.52 (L) 4.22 - 5.81 MIL/uL Final          Passed - Valid encounter within last 12 months    Recent Outpatient Visits           3 months ago Acute pain of left shoulder   [place], Charlane Ferretti, MD   3 months ago Prediabetes   [place], [place], [place]   8 months ago Anemia due to GI blood loss   [place], [place], MD   8 months ago Essential hypertension   [place], [place], MD   1 year ago Essential hypertension   [place], MD       Future Appointments             In 1 month Erlinda Hong, Marylynn Pearson, MD [facility] [place]              losartan (COZAAR) 50 MG tablet 90 tablet 1    Sig: Take 1 tablet (50 mg total) by mouth daily. To lower blood pressure      Cardiovascular:  Angiotensin Receptor Blockers Failed - 06/30/2020  8:40 AM      Failed - Last BP in normal range    BP Readings from Last 1 Encounters:  05/10/20 (!) 152/101          Passed - Cr in normal range and within 180 days    Creatinine, Ser  Date Value Ref Range Status  03/16/2020 0.84 0.76 - 1.27 mg/dL Final          Passed - K in normal range and within 180 days    Potassium  Date Value Ref Range Status  03/16/2020 4.6 3.5 - 5.2 mmol/L Final          Passed - Patient is not pregnant      Passed - Valid encounter within last 6 months    Recent Outpatient Visits           3 months ago Acute pain of left shoulder   [place], [place], MD   3 months ago Prediabetes   [place], [place], [place]  8 months ago Anemia due to GI blood loss   [place], MD   8 months ago Essential hypertension   [place], [place], MD   1 year ago Essential hypertension   [place], MD       Future Appointments             In 1 month Erlinda Hong, Marylynn Pearson, MD [place]

## 2020-07-05 ENCOUNTER — Encounter: Payer: Self-pay | Admitting: Physical Therapy

## 2020-07-05 ENCOUNTER — Ambulatory Visit: Payer: Medicaid Other | Attending: Physician Assistant | Admitting: Physical Therapy

## 2020-07-05 ENCOUNTER — Other Ambulatory Visit: Payer: Self-pay

## 2020-07-05 DIAGNOSIS — M25512 Pain in left shoulder: Secondary | ICD-10-CM

## 2020-07-05 DIAGNOSIS — M6281 Muscle weakness (generalized): Secondary | ICD-10-CM | POA: Insufficient documentation

## 2020-07-05 DIAGNOSIS — M25611 Stiffness of right shoulder, not elsewhere classified: Secondary | ICD-10-CM | POA: Insufficient documentation

## 2020-07-05 DIAGNOSIS — R600 Localized edema: Secondary | ICD-10-CM

## 2020-07-05 NOTE — Therapy (Signed)
[place], [place], [address] Phone: [phone]   Fax:  [phone]  Physical Therapy Treatment  Patient Details  Name: Joe [name] Sr. MRN: [id number] Date of Birth: 08-06-1961 Referring Provider (PT): Tiney Rouge PA-C   Encounter Date: 07/05/2020   PT End of Session - 07/05/20 0952    Visit Number 2    Number of Visits 4    Date for PT Re-Evaluation 07/24/20    Authorization Type Medicaid-awaiting authorization    PT Start Time 0925    PT Stop Time 1004   no charge today-Medicaid authorization not received yet   PT Time Calculation (min) 39 min    Activity Tolerance Patient tolerated treatment well    Behavior During Therapy [facility] for tasks assessed/performed           Past Medical History:  Diagnosis Date  . Diabetes mellitus without complication ([place])   . Hypertension 2016  . Left shoulder pain   . Mass of pancreas 03/11/2019  . Microcytic anemia   . 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 COLONOSCOPY WITH PROPOFOL N/A 11/08/2018   Procedure: COLONOSCOPY WITH PROPOFOL;  Surgeon: Lavena Bullion, DO;  Location: [place];  Service: Gastroenterology;  Laterality: N/A;  . ESOPHAGOGASTRODUODENOSCOPY (EGD) WITH PROPOFOL N/A 11/06/2018   Procedure: ESOPHAGOGASTRODUODENOSCOPY (EGD) WITH PROPOFOL;  Surgeon: Carol Ada, MD;  Location: [place];  Service: Endoscopy;  Laterality: N/A;  . EUS  11/06/2018   Procedure: FULL UPPER ENDOSCOPIC ULTRASOUND (EUS) RADIAL;  Surgeon: Carol Ada, MD;  Location: [place];  Service: Endoscopy;;  . GIVENS CAPSULE STUDY N/A 11/08/2018   Procedure: GIVENS CAPSULE STUDY;  Surgeon: Lavena Bullion, DO;  Location: [place];  Service: Gastroenterology;  Laterality: N/A;  . IR ANGIOGRAM SELECTIVE EACH ADDITIONAL VESSEL  11/10/2018  . IR ANGIOGRAM VISCERAL SELECTIVE   11/10/2018  . IR ANGIOGRAM VISCERAL SELECTIVE  11/10/2018  . IR EMBO ART  VEN HEMORR LYMPH EXTRAV  INC GUIDE ROADMAPPING  11/10/2018  . IR US GUIDE VASC ACCESS RIGHT  11/10/2018  . KNEE SURGERY Bilateral 1985   Arthroscopic for torn cartilage and ligaments.    Marland Kitchen SHOULDER OPEN ROTATOR CUFF REPAIR Left 05/10/2020   Procedure: LEFT SHOULDER OPEN ROTATOR CUFF REPAIR AND BICEPS TENODESIS;  Surgeon: Leandrew Koyanagi, MD;  Location: [place];  Service: Orthopedics;  Laterality: Left;    There were no vitals filed for this visit.   Subjective Assessment - 07/05/20 0928    Subjective Pt. presents for first follow up visit after eval. Left shoulder pain 2/10 this AM. Doing HEP "off and on" about 2x/week.    Patient Stated Goals to go back to work    Currently in Pain? Yes    Pain Score 2     Pain Location Shoulder    Pain Orientation Left    Pain Descriptors / Indicators Aching    Pain Type Chronic pain    Pain Onset More than a month ago    Pain Frequency Constant    Aggravating Factors  arm use    Pain Relieving Factors rest    Effect of Pain on Daily Activities impacts ability for reaching ADLs                             [facility] Adult PT Treatment/Exercise - 07/05/20 0001  Elbow Exercises   Elbow Flexion AROM;Left;20 reps      Shoulder Exercises: Supine   Other Supine Exercises supine wand IR/ER 2x10, supine wand flexion    Other Supine Exercises supine wand flexion AAROM-initially with elbows extended but then switched to supine wand short arc press to overhead reach combo due to difficulty with elbos extended, performed x 15 reps total      Shoulder Exercises: Standing   Retraction Limitations scapular retraction isometric 2x10 for 5 sec holds    Other Standing Exercises pendulums x 2 min-mod cues to use body momentum to facilitate motion    Other Standing Exercises "saw" row AROM 2x10      Shoulder Exercises: Isometric Strengthening   Flexion  Limitations 5 sec x 10 reps-light/gentle pressure    External Rotation Limitations 5 sec x 10 reps light/gentle pressure    Internal Rotation Limitations 5 sec x 10 reps light/gentle pressure   "2 finger" isometric   ABduction Limitations 5 sec x 10 reps light/genlte pressure      Manual Therapy   Joint Mobilization gentle PA and AP mobilizations    grade I-II   Passive ROM PROM left shoulder per protocol                  PT Education - 07/05/20 0951    Education Details HEP, post-op restrictions, exercises    Person(s) Educated Patient    Methods Explanation;Demonstration;Verbal cues    Comprehension Verbalized understanding;Returned demonstration            PT Short Term Goals - 06/26/20 1237      PT SHORT TERM GOAL #1   Title Patient will increase gross left shoulder strength to 4+/5    Baseline unable to assess 2nd to recent surgery    Time 3    Period Weeks    Status New    Target Date 07/17/20      PT SHORT TERM GOAL #2   Title Patient will be independent with basic HEP    Baseline has no HEP    Time 3    Period Weeks    Status New    Target Date 07/17/20      PT SHORT TERM GOAL #3   Title Patient will increase passive left shoulder flexion and Er to full    Baseline ER 60, flexion 90    Time 3    Period Weeks    Status New    Target Date 07/17/20             PT Long Term Goals - 06/26/20 1239      PT LONG TERM GOAL #1   Title Patient will place a box > 20 lbs overhead in order to return to work if cleared by MD    Baseline can not lift overhead    Time 6    Period Weeks    Status New    Target Date 08/07/20      PT LONG TERM GOAL #2   Title Patient will demonstrate full AROM with all motions in order to perfrom ADL's    Baseline AROM not tested 2nd to recent surgery but limited    Time 6    Period Weeks    Status New    Target Date 08/07/20                 Plan - 07/05/20 1005    Clinical Impression Statement Pt. presents  for first therapy  follow up visit after eval now 8 weeks post-op. Though limited HEP performance(encouraged to perform daily) he is doing well for timeframe post-op with minimal stiffness and expect will continue to progress well per protocol parameters with objective gains and improved functional use of left shoulder.    Personal Factors and Comorbidities Comorbidity 1;Profession    Comorbidities DMII    Examination-Activity Limitations Locomotion Level;Reach Overhead;Carry    Examination-Participation Restrictions Cleaning;Community Activity;Occupation    Stability/Clinical Decision Making Evolving/Moderate complexity    Clinical Decision Making Moderate    Rehab Potential Excellent    PT Frequency 1x / week    PT Duration 4 weeks    PT Treatment/Interventions ADLs/Self Care Home Management;Cryotherapy;Electrical Stimulation;Moist Heat;Iontophoresis 4mg /ml Dexamethasone;Ultrasound;DME Instruction;Functional mobility training;Therapeutic activities;Therapeutic exercise;Neuromuscular re-education;Patient/family education;Manual techniques;Passive range of motion;Taping    PT Next Visit Plan continue per protocol with PROM, AAROM, isometrics, scap strengthening, progress AROM as tolerated per protocol and update HEP prn    PT Home Exercise Plan pendulums, scap retraction, table slide, wand flex, wand ER;Access Code: [email or web address] by: Shanon Brow CarrollExercisesSeated Shoulder Flexion Towel Slide at Table Top - 2 x daily - 7 x weekly - 3 sets - 10 repsSupine Shoulder Flexion Extension AAROM with Dowel - 2 x daily - 7 x weekly - 3 sets - 10 repsSupine Shoulder External Rotation in 45 Degrees Abduction AAROM with Dowel - 2 x daily - 7 x weekly - 3 sets - 10 repsSeated Scapular Retraction - 2 x daily - 7 x weekly - 3 sets - 10 repsCircular Shoulder Pendulum with Table Support - 2 x daily - 7 x weekly - 3 sets - 10 reps    Consulted and Agree with Plan of Care Patient           Patient will benefit from  skilled therapeutic intervention in order to improve the following deficits and impairments:  Decreased endurance, Decreased mobility, Increased muscle spasms, Decreased activity tolerance, Pain, Impaired UE functional use, Decreased range of motion, Increased edema  Visit Diagnosis: Acute pain of left shoulder  Stiffness of right shoulder, not elsewhere classified  Muscle weakness (generalized)  Localized edema     Problem List Patient Active Problem List   Diagnosis Date Noted  . Full thickness tear of left subscapularis tendon 04/12/2020  . Traumatic tear of supraspinatus tendon of left shoulder 04/12/2020  . 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 Mass of pancreas 03/11/2019  . Microcytic anemia   . 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 Hypertension 11/04/2014    Beaulah Dinning, PT, DPT 07/05/20 10:11 AM  [facility] [address] [place], [place], [address] Phone: [phone]   Fax:  [phone]  Name: LUMAN HOLWAY Sr. MRN: [id number] Date of Birth: [birth year]

## 2020-07-12 ENCOUNTER — Encounter: Payer: Self-pay | Admitting: Physical Therapy

## 2020-07-12 ENCOUNTER — Ambulatory Visit: Payer: Medicaid Other | Admitting: Physical Therapy

## 2020-07-12 ENCOUNTER — Other Ambulatory Visit: Payer: Self-pay

## 2020-07-12 DIAGNOSIS — M25512 Pain in left shoulder: Secondary | ICD-10-CM

## 2020-07-12 DIAGNOSIS — M25611 Stiffness of right shoulder, not elsewhere classified: Secondary | ICD-10-CM

## 2020-07-12 DIAGNOSIS — R600 Localized edema: Secondary | ICD-10-CM

## 2020-07-12 DIAGNOSIS — M6281 Muscle weakness (generalized): Secondary | ICD-10-CM | POA: Diagnosis present

## 2020-07-12 NOTE — Therapy (Signed)
[place], [place], [address] Phone: [phone]   Fax:  [phone]  Physical Therapy Treatment  Patient Details  Name: Joe VITUG Sr. MRN: [id number] Date of Birth: 1961/04/24 Referring Provider (PT): Tiney Rouge PA-C   Encounter Date: 07/12/2020   PT End of Session - 07/12/20 0858    Visit Number 3    Number of Visits 4    Date for PT Re-Evaluation 07/24/20    Authorization Type Medicaid-awaiting authorization    PT Start Time 0845    PT Stop Time 0925    PT Time Calculation (min) 40 min    Activity Tolerance Patient tolerated treatment well    Behavior During Therapy [facility] for tasks assessed/performed           Past Medical History:  Diagnosis Date  . Diabetes mellitus without complication ([place])   . Hypertension 2016  . Left shoulder pain   . Mass of pancreas 03/11/2019  . Microcytic anemia   . 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 COLONOSCOPY WITH PROPOFOL N/A 11/08/2018   Procedure: COLONOSCOPY WITH PROPOFOL;  Surgeon: Lavena Bullion, DO;  Location: [place];  Service: Gastroenterology;  Laterality: N/A;  . ESOPHAGOGASTRODUODENOSCOPY (EGD) WITH PROPOFOL N/A 11/06/2018   Procedure: ESOPHAGOGASTRODUODENOSCOPY (EGD) WITH PROPOFOL;  Surgeon: Carol Ada, MD;  Location: [place];  Service: Endoscopy;  Laterality: N/A;  . EUS  11/06/2018   Procedure: FULL UPPER ENDOSCOPIC ULTRASOUND (EUS) RADIAL;  Surgeon: Carol Ada, MD;  Location: [place];  Service: Endoscopy;;  . GIVENS CAPSULE STUDY N/A 11/08/2018   Procedure: GIVENS CAPSULE STUDY;  Surgeon: Lavena Bullion, DO;  Location: [place];  Service: Gastroenterology;  Laterality: N/A;  . IR ANGIOGRAM SELECTIVE EACH ADDITIONAL VESSEL  11/10/2018  . IR ANGIOGRAM VISCERAL SELECTIVE  11/10/2018  . IR ANGIOGRAM VISCERAL SELECTIVE  11/10/2018  .  IR EMBO ART  VEN HEMORR LYMPH EXTRAV  INC GUIDE ROADMAPPING  11/10/2018  . IR US GUIDE VASC ACCESS RIGHT  11/10/2018  . KNEE SURGERY Bilateral 1985   Arthroscopic for torn cartilage and ligaments.    Marland Kitchen SHOULDER OPEN ROTATOR CUFF REPAIR Left 05/10/2020   Procedure: LEFT SHOULDER OPEN ROTATOR CUFF REPAIR AND BICEPS TENODESIS;  Surgeon: Leandrew Koyanagi, MD;  Location: [place];  Service: Orthopedics;  Laterality: Left;    There were no vitals filed for this visit.   Subjective Assessment - 07/12/20 0849    Subjective Patient reports it has been a little sore but overall it is doing OK. He has been owrking on the exercises given to him last session.    Pertinent History use of the shoulder    How long can you sit comfortably? N/A    How long can you stand comfortably? N/A    How long can you walk comfortably? N/A    Diagnostic tests nothing post -op    Patient Stated Goals to go back to work    Currently in Pain? Yes    Pain Score 3     Pain Location Shoulder    Pain Orientation Left    Pain Descriptors / Indicators Aching    Pain Type Chronic pain    Pain Onset More than a month ago    Pain Frequency Constant    Aggravating Factors  use of the arm    Pain Relieving Factors rest    Effect  of Pain on Daily Activities impacts the ability for reachning                             [facility] Adult PT Treatment/Exercise - 07/12/20 0001      Shoulder Exercises: Supine   Other Supine Exercises supine wand IR/ER 2x10, supine wand flexion    Other Supine Exercises wand flexion 2x10 with full elbow extension       Shoulder Exercises: Standing   Extension 20 reps    Theraband Level (Shoulder Extension) Level 1 (Yellow)    Retraction 20 reps    Theraband Level (Shoulder Retraction) Level 1 (Yellow)      Shoulder Exercises: Isometric Strengthening   Flexion Limitations 5 sec x 10 reps-light/gentle pressure    External Rotation Limitations 5 sec x 10 reps  light/gentle pressure    Internal Rotation Limitations 5 sec x 10 reps light/gentle pressure   "2 finger" isometric   ABduction Limitations 5 sec x 10 reps light/genlte pressure      Manual Therapy   Manual Therapy Soft tissue mobilization;Joint mobilization;Passive ROM    Joint Mobilization gentle PA and inferior glides mobilizations     Soft tissue mobilization to upper trap     Passive ROM gentle PROM                   PT Education - 07/12/20 0850    Education Details reviewed home program    Person(s) Educated Patient    Methods Explanation;Tactile cues;Demonstration;Verbal cues    Comprehension Verbalized understanding;Returned demonstration;Verbal cues required;Tactile cues required            PT Short Term Goals - 06/26/20 1237      PT SHORT TERM GOAL #1   Title Patient will increase gross left shoulder strength to 4+/5    Baseline unable to assess 2nd to recent surgery    Time 3    Period Weeks    Status New    Target Date 07/17/20      PT SHORT TERM GOAL #2   Title Patient will be independent with basic HEP    Baseline has no HEP    Time 3    Period Weeks    Status New    Target Date 07/17/20      PT SHORT TERM GOAL #3   Title Patient will increase passive left shoulder flexion and Er to full    Baseline ER 60, flexion 90    Time 3    Period Weeks    Status New    Target Date 07/17/20             PT Long Term Goals - 06/26/20 1239      PT LONG TERM GOAL #1   Title Patient will place a box > 20 lbs overhead in order to return to work if cleared by MD    Baseline can not lift overhead    Time 6    Period Weeks    Status New    Target Date 08/07/20      PT LONG TERM GOAL #2   Title Patient will demonstrate full AROM with all motions in order to perfrom ADL's    Baseline AROM not tested 2nd to recent surgery but limited    Time 6    Period Weeks    Status New    Target Date 08/07/20  Plan - 07/12/20 0907     Clinical Impression Statement Patient continues to make excellent progress. He is at end range with all major ranges. He continues to have anterior shoulder pain but it is minimla to moderate at worst. he did well with ther-ex. He required only minimal cuing. He was given active scpaular scpaular strengthening for home.    Personal Factors and Comorbidities Comorbidity 1;Profession    Examination-Activity Limitations Locomotion Level;Reach Overhead;Carry    Stability/Clinical Decision Making Evolving/Moderate complexity    Clinical Decision Making Moderate    Rehab Potential Excellent    PT Frequency 1x / week    PT Duration 4 weeks    PT Treatment/Interventions ADLs/Self Care Home Management;Cryotherapy;Electrical Stimulation;Moist Heat;Iontophoresis 4mg /ml Dexamethasone;Ultrasound;DME Instruction;Functional mobility training;Therapeutic activities;Therapeutic exercise;Neuromuscular re-education;Patient/family education;Manual techniques;Passive range of motion;Taping    PT Next Visit Plan continue per protocol with PROM, AAROM, isometrics, scap strengthening, progress AROM as tolerated per protocol and update HEP prn    PT Home Exercise Plan pendulums, scap retraction, table slide, wand flex, wand ER;Access Code: [email or web address] by: Shanon Brow CarrollExercisesSeated Shoulder Flexion Towel Slide at Table Top - 2 x daily - 7 x weekly - 3 sets - 10 repsSupine Shoulder Flexion Extension AAROM with Dowel - 2 x daily - 7 x weekly - 3 sets - 10 repsSupine Shoulder External Rotation in 45 Degrees Abduction AAROM with Dowel - 2 x daily - 7 x weekly - 3 sets - 10 repsSeated Scapular Retraction - 2 x daily - 7 x weekly - 3 sets - 10 repsCircular Shoulder Pendulum with Table Support - 2 x daily - 7 x weekly - 3 sets - 10 reps    Consulted and Agree with Plan of Care Patient           Patient will benefit from skilled therapeutic intervention in order to improve the following deficits and impairments:  Decreased  endurance, Decreased mobility, Increased muscle spasms, Decreased activity tolerance, Pain, Impaired UE functional use, Decreased range of motion, Increased edema  Visit Diagnosis: Acute pain of left shoulder  Stiffness of right shoulder, not elsewhere classified  Muscle weakness (generalized)  Localized edema     Problem List Patient Active Problem List   Diagnosis Date Noted  . Full thickness tear of left subscapularis tendon 04/12/2020  . Traumatic tear of supraspinatus tendon of left shoulder 04/12/2020  . 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 Mass of pancreas 03/11/2019  . Microcytic anemia   . 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 Hypertension 11/04/2014    Carney Living PT DPT  07/12/2020, 2:32 PM  [facility] [address] [place], [place], [address] Phone: [phone]   Fax:  [phone]  Name: Joe BARKDULL Sr. MRN: [id number] Date of Birth: [birth year]

## 2020-07-19 ENCOUNTER — Ambulatory Visit: Payer: Medicaid Other | Admitting: Physical Therapy

## 2020-07-19 ENCOUNTER — Other Ambulatory Visit: Payer: Self-pay

## 2020-07-19 ENCOUNTER — Encounter: Payer: Self-pay | Admitting: Physical Therapy

## 2020-07-19 DIAGNOSIS — M25611 Stiffness of right shoulder, not elsewhere classified: Secondary | ICD-10-CM

## 2020-07-19 DIAGNOSIS — M6281 Muscle weakness (generalized): Secondary | ICD-10-CM

## 2020-07-19 DIAGNOSIS — M25512 Pain in left shoulder: Secondary | ICD-10-CM | POA: Diagnosis not present

## 2020-07-19 DIAGNOSIS — R600 Localized edema: Secondary | ICD-10-CM

## 2020-07-20 ENCOUNTER — Encounter: Payer: Self-pay | Admitting: Physical Therapy

## 2020-07-20 NOTE — Therapy (Signed)
[place], [place], [address] Phone: [phone]   Fax:  [phone]  Physical Therapy Treatment  Patient Details  Name: Joe ELTING Sr. MRN: [id number] Date of Birth: 1961-05-25 Referring Provider (PT): Tiney Rouge PA-C   Encounter Date: 07/19/2020   PT End of Session - 07/19/20 0850    Visit Number 4    Number of Visits 16    Date for PT Re-Evaluation 08/31/20    Authorization Type ask for more visits !    Authorization - Visit Number 2    Authorization - Number of Visits 3    PT Start Time 0845    PT Stop Time 0925    PT Time Calculation (min) 40 min    Activity Tolerance Patient tolerated treatment well    Behavior During Therapy [facility] for tasks assessed/performed           Past Medical History:  Diagnosis Date   Diabetes mellitus without complication (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    Past Surgical History:  Procedure Laterality Date   COLONOSCOPY WITH PROPOFOL N/A 11/08/2018   Procedure: COLONOSCOPY WITH PROPOFOL;  Surgeon: Lavena Bullion, DO;  Location: [facility];  Service: Gastroenterology;  Laterality: N/A;   ESOPHAGOGASTRODUODENOSCOPY (EGD) WITH PROPOFOL N/A 11/06/2018   Procedure: ESOPHAGOGASTRODUODENOSCOPY (EGD) WITH PROPOFOL;  Surgeon: Carol Ada, MD;  Location: [place];  Service: Endoscopy;  Laterality: N/A;   EUS  11/06/2018   Procedure: FULL UPPER ENDOSCOPIC ULTRASOUND (EUS) RADIAL;  Surgeon: Carol Ada, MD;  Location: [place];  Service: Endoscopy;;   GIVENS CAPSULE STUDY N/A 11/08/2018   Procedure: GIVENS CAPSULE STUDY;  Surgeon: Lavena Bullion, DO;  Location: [place];  Service: Gastroenterology;  Laterality: N/A;   IR ANGIOGRAM SELECTIVE EACH ADDITIONAL VESSEL  11/10/2018   IR ANGIOGRAM VISCERAL  SELECTIVE  11/10/2018   IR ANGIOGRAM VISCERAL SELECTIVE  11/10/2018   IR EMBO ART  VEN HEMORR LYMPH EXTRAV  INC GUIDE ROADMAPPING  11/10/2018   IR US GUIDE VASC ACCESS RIGHT  11/10/2018   KNEE SURGERY Bilateral 1985   Arthroscopic for torn cartilage and ligaments.     SHOULDER OPEN ROTATOR CUFF REPAIR Left 05/10/2020   Procedure: LEFT SHOULDER OPEN ROTATOR CUFF REPAIR AND BICEPS TENODESIS;  Surgeon: Leandrew Koyanagi, MD;  Location: [facility];  Service: Orthopedics;  Laterality: Left;    There were no vitals filed for this visit.   Subjective Assessment - 07/19/20 0913    Subjective Patient reports it was sore last night. he feels like he mayu have slept on it. This morning it isn't too bad. He feels like the weather is playing a role too.    Pertinent History use of the shoulder    Currently in Pain? Yes    Pain Score 2     Pain Location Shoulder    Pain Orientation Left    Pain Descriptors / Indicators Aching    Pain Type Chronic pain    Pain Onset More than a month ago    Pain Frequency Constant    Aggravating Factors  use of the arm    Pain Relieving Factors rest    Effect of Pain on Daily Activities impacts the abiulity to reach  [facility] PT Assessment - 07/20/20 0001      Assessment   Medical Diagnosis Left RTC repair    Referring Provider (PT) Tiney Rouge PA-C    Onset Date/Surgical Date 05/10/20    Hand Dominance Right    Next MD Visit 6 week s    Prior Therapy None       Precautions   Precautions None      Restrictions   Weight Bearing Restrictions No      Balance Screen   Has the patient fallen in the past 6 months No    Has the patient had a decrease in activity level because of a fear of falling?  No    Is the patient reluctant to leave their home because of a fear of falling?  No      Home Environment   Additional Comments nothing significant       Prior Function   Level of Independence Independent    Vocation Full time  employment    Vocation Requirements Needs to be able to lift 40 pound boxes up overhead     Leisure would like to get back to doing anything       Cognition   Overall Cognitive Status Within Functional Limits for tasks assessed    Attention Focused    Focused Attention Appears intact    Memory Appears intact    Awareness Appears intact    Problem Solving Appears intact      Observation/Other Assessments   Focus on Therapeutic Outcomes (FOTO)  Mediciad       Sensation   Light Touch Appears Intact    Additional Comments Denies parathesias       Coordination   Gross Motor Movements are Fluid and Coordinated Yes    Fine Motor Movements are Fluid and Coordinated Yes      AROM   Overall AROM Comments Not tested 2nd to recent surgery       PROM   Left Shoulder Flexion 170 Degrees    Left Shoulder Internal Rotation 80 Degrees    Left Shoulder External Rotation 68 Degrees      Strength   Overall Strength Comments still not able to activley flex his shoulder       Palpation   Palpation comment mild spasming of the right upper trap .                          [facility] Adult PT Treatment/Exercise - 07/20/20 0001      Shoulder Exercises: Supine   Other Supine Exercises wand flexion 2x10 with full elbow extension       Shoulder Exercises: Sidelying   External Rotation Limitations 2x10 with towel       Shoulder Exercises: Standing   Extension 20 reps    Theraband Level (Shoulder Extension) Level 2 (Red)    Retraction 20 reps    Theraband Level (Shoulder Retraction) Level 1 (Yellow)      Shoulder Exercises: Pulleys   Flexion 2 minutes    Scaption 2 minutes      Manual Therapy   Manual Therapy Soft tissue mobilization;Joint mobilization;Passive ROM    Joint Mobilization gentle PA and inferior glides mobilizations     Soft tissue mobilization to upper trap     Passive ROM gentle PROM                   PT Education - 07/19/20 9449  Education Details  progression of activity and exrercises    Person(s) Educated Patient    Methods Explanation;Demonstration;Tactile cues;Verbal cues    Comprehension Verbalized understanding;Returned demonstration;Verbal cues required;Tactile cues required            PT Short Term Goals - 07/20/20 0809      PT SHORT TERM GOAL #1   Title Patient will increase gross left shoulder strength to 4+/5    Baseline continues to be <3/5    Time 3    Period Weeks    Status On-going    Target Date 08/10/20      PT SHORT TERM GOAL #2   Title Patient will be independent with basic HEP    Baseline perfroming base HEP    Time 3    Period Weeks    Status Achieved    Target Date 07/17/20      PT SHORT TERM GOAL #3   Title Patient will increase passive left shoulder flexion and Er to full    Baseline mild limitations at end range flexion and ER    Time 3    Period Weeks    Status On-going    Target Date 08/10/20             PT Long Term Goals - 07/20/20 0810      PT LONG TERM GOAL #1   Title Patient will place a box > 20 lbs overhead in order to return to work if cleared by MD    Baseline have not started active strengthening    Time 6    Period Weeks    Status On-going      PT LONG TERM GOAL #2   Title Patient will demonstrate full AROM with all motions in order to perfrom ADL's    Baseline limited active motion with all motions    Time 6    Period Weeks    Status On-going    Target Date 08/31/20                 Plan - 07/19/20 0916    Clinical Impression Statement Patient continues to do very well. he has minor pain with ther-ex but it is tolerale. Therapy advanced his band and reps with scpaular strengthening. His rnage was full with minimal ther-ex. Therapy added side lying er and rythmic stabilziation. He was also given t-band IR for home. He was advised to ice if needed. Therapy will continue to advance strengtrhening as tolerated. He is making very good progress. His passive  range has improved to nearly full. He continues to have weakness with fucntional movements but we have just begun strengthening. He would benefit from further therapy 2W6 to improve ability to return to work when able.    Examination-Activity Limitations Locomotion Level;Reach Overhead;Carry    Stability/Clinical Decision Making Evolving/Moderate complexity    Clinical Decision Making Moderate    Rehab Potential Excellent    PT Frequency 2x / week    PT Duration 6 weeks    PT Treatment/Interventions ADLs/Self Care Home Management;Cryotherapy;Electrical Stimulation;Moist Heat;Iontophoresis 4mg /ml Dexamethasone;Ultrasound;DME Instruction;Functional mobility training;Therapeutic activities;Therapeutic exercise;Neuromuscular re-education;Patient/family education;Manual techniques;Passive range of motion;Taping    PT Next Visit Plan continue per protocol with PROM, AAROM, isometrics, scap strengthening, progress AROM as tolerated per protocol and update HEP prn review tolerance to active strengthening    PT Home Exercise Plan pendulums, scap retraction, table slide, wand flex, wand ER;Access Code: [email or web address] by: Shanon Brow CarrollExercisesSeated Shoulder Flexion Towel Slide at Table Top -  2 x daily - 7 x weekly - 3 sets - 10 repsSupine Shoulder Flexion Extension AAROM with Dowel - 2 x daily - 7 x weekly - 3 sets - 10 repsSupine Shoulder External Rotation in 45 Degrees Abduction AAROM with Dowel - 2 x daily - 7 x weekly - 3 sets - 10 repsSeated Scapular Retraction - 2 x daily - 7 x weekly - 3 sets - 10 repsCircular Shoulder Pendulum with Table Support - 2 x daily - 7 x weekly - 3 sets - 10 reps    Consulted and Agree with Plan of Care Patient           Patient will benefit from skilled therapeutic intervention in order to improve the following deficits and impairments:  Decreased endurance, Decreased mobility, Increased muscle spasms, Decreased activity tolerance, Pain, Impaired UE functional use,  Decreased range of motion, Increased edema  Visit Diagnosis: Acute pain of left shoulder - Plan: PT plan of care cert/re-cert  Stiffness of right shoulder, not elsewhere classified - Plan: PT plan of care cert/re-cert  Muscle weakness (generalized) - Plan: PT plan of care cert/re-cert  Localized edema - Plan: PT plan of care cert/re-cert     Problem List Patient Active Problem List   Diagnosis Date Noted   Full thickness tear of left subscapularis tendon 04/12/2020   Traumatic tear of supraspinatus tendon of left shoulder 04/12/2020   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 [address] [place], [place], [address] Phone: [phone]   Fax:  [phone]  Name: Joe ALCOCK Sr. MRN: [id number] Date of Birth: Nov 02, 1961

## 2020-07-25 ENCOUNTER — Other Ambulatory Visit: Payer: Self-pay

## 2020-07-25 ENCOUNTER — Ambulatory Visit: Payer: Medicaid Other

## 2020-07-25 DIAGNOSIS — M25512 Pain in left shoulder: Secondary | ICD-10-CM | POA: Diagnosis not present

## 2020-07-25 DIAGNOSIS — R600 Localized edema: Secondary | ICD-10-CM

## 2020-07-25 DIAGNOSIS — M6281 Muscle weakness (generalized): Secondary | ICD-10-CM

## 2020-07-25 DIAGNOSIS — M25611 Stiffness of right shoulder, not elsewhere classified: Secondary | ICD-10-CM

## 2020-07-25 NOTE — Therapy (Addendum)
[place], [place], [address] Phone: [phone]   Fax:  [phone]  Physical Therapy Treatment  Patient Details  Name: Joe CHUBA Sr. MRN: [id number] Date of Birth: [birth year] Referring Provider (PT): Tiney Rouge PA-C   Encounter Date: 07/25/2020   PT End of Session - 07/25/20 0913    Visit Number 5    Number of Visits 16    Date for PT Re-Evaluation 08/31/20    Authorization Type ask for more visits !    Authorization - Visit Number 3    Authorization - Number of Visits 3    PT Start Time 0915    PT Stop Time 0955    PT Time Calculation (min) 40 min    Activity Tolerance Patient tolerated treatment well    Behavior During Therapy [facility] for tasks assessed/performed           Past Medical History:  Diagnosis Date  . Diabetes mellitus without complication ([place])   . Hypertension 2016  . Left shoulder pain   . Mass of pancreas 03/11/2019  . Microcytic anemia   . 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 COLONOSCOPY WITH PROPOFOL N/A 11/08/2018   Procedure: COLONOSCOPY WITH PROPOFOL;  Surgeon: Lavena Bullion, DO;  Location: [place];  Service: Gastroenterology;  Laterality: N/A;  . ESOPHAGOGASTRODUODENOSCOPY (EGD) WITH PROPOFOL N/A 11/06/2018   Procedure: ESOPHAGOGASTRODUODENOSCOPY (EGD) WITH PROPOFOL;  Surgeon: Carol Ada, MD;  Location: [facility];  Service: Endoscopy;  Laterality: N/A;  . EUS  11/06/2018   Procedure: FULL UPPER ENDOSCOPIC ULTRASOUND (EUS) RADIAL;  Surgeon: Carol Ada, MD;  Location: [place];  Service: Endoscopy;;  . GIVENS CAPSULE STUDY N/A 11/08/2018   Procedure: GIVENS CAPSULE STUDY;  Surgeon: Lavena Bullion, DO;  Location: [facility];  Service: Gastroenterology;  Laterality: N/A;  . IR ANGIOGRAM SELECTIVE EACH ADDITIONAL VESSEL  11/10/2018  . IR ANGIOGRAM VISCERAL  SELECTIVE  11/10/2018  . IR ANGIOGRAM VISCERAL SELECTIVE  11/10/2018  . IR EMBO ART  VEN HEMORR LYMPH EXTRAV  INC GUIDE ROADMAPPING  11/10/2018  . IR US GUIDE VASC ACCESS RIGHT  11/10/2018  . KNEE SURGERY Bilateral 1985   Arthroscopic for torn cartilage and ligaments.    [facility] SHOULDER OPEN ROTATOR CUFF REPAIR Left 05/10/2020   Procedure: LEFT SHOULDER OPEN ROTATOR CUFF REPAIR AND BICEPS TENODESIS;  Surgeon: Leandrew Koyanagi, MD;  Location: [place];  Service: Orthopedics;  Laterality: Left;    There were no vitals filed for this visit.   Subjective Assessment - 07/25/20 0917    Subjective He reports sore but he isalright.    Pain Location Shoulder    Pain Orientation Left    Pain Descriptors / Indicators Sore    Pain Type Chronic pain    Pain Onset More than a month ago    Pain Frequency Constant    Aggravating Factors  using arm    Pain Relieving Factors rest              [facility] PT Assessment - 07/25/20 0001      Assessment   Medical Diagnosis Left RTC repair    Referring Provider (PT) Tiney Rouge PA-C    Onset Date/Surgical Date 05/10/20    Hand Dominance Right    Next MD Visit 6 week s    Prior Therapy None       Precautions  Precautions None      Restrictions   Weight Bearing Restrictions No      Prior Function   Vocation Requirements Needs to be able to lift 40 pound boxes up overhead       Cognition   Overall Cognitive Status Within Functional Limits for tasks assessed      AROM   Overall AROM Comments Not tested 2nd to recent surgery       PROM   Left Shoulder Flexion 170 Degrees    Left Shoulder Internal Rotation 80 Degrees    Left Shoulder External Rotation 68 Degrees      Strength   Overall Strength Comments All motions essentially full ROM in supine                         [facility] Adult PT Treatment/Exercise - 07/25/20 0001      Shoulder Exercises: Supine   Other Supine Exercises wand flexion 2x10 with full elbow extension        Shoulder Exercises: Standing   Extension 20 reps    Theraband Level (Shoulder Extension) Level 2 (Red)    Retraction 20 reps    Theraband Level (Shoulder Retraction) Level 1 (Yellow)      Shoulder Exercises: Pulleys   Flexion 2 minutes    Scaption 2 minutes      Manual Therapy   Joint Mobilization gentle PA and inferior glides mobilizations     Passive ROM gentle PROM                   PT Education - 07/25/20 0958    Education Details isometrics , he was cautioned to ease pressure or reps if shoulder soreness started and to ice if needed after    Person(s) Educated Patient    Methods Explanation;Demonstration;Tactile cues;Verbal cues;Handout    Comprehension Verbalized understanding;Returned demonstration            PT Short Term Goals - 07/25/20 0959      PT SHORT TERM GOAL #1   Title Patient will increase gross left shoulder strength to 4+/5    Baseline Not assessed but good isometric resistance with min soreness    Time 3    Period Weeks    Status On-going      PT SHORT TERM GOAL #2   Title Patient will be independent with basic HEP    Baseline perfroming base HEP    Status Achieved      PT SHORT TERM GOAL #3   Title Patient will increase passive left shoulder flexion and Er to full    Baseline essentially full ROM    Status Achieved             PT Long Term Goals - 07/20/20 0810      PT LONG TERM GOAL #1   Title Patient will place a box > 20 lbs overhead in order to return to work if cleared by MD    Baseline have not started active strengthening    Time 6    Period Weeks    Status On-going      PT LONG TERM GOAL #2   Title Patient will demonstrate full AROM with all motions in order to perfrom ADL's    Baseline limited active motion with all motions    Time 6    Period Weeks    Status On-going    Target Date 08/31/20  Plan: Mr Perlmutter is 10 weeks out from surgery and he is allowed to progress to AROM and light  resistance with isometrics and band resisted exercises.  In 2 weeks he is allowed to progress with strengthening as tolerated min to no pain all planes. He will need skilled PT to progress strength in pain free progression.      Plan - 07/25/20 0914    PT Treatment/Interventions ADLs/Self Care Home Management;Cryotherapy;Electrical Stimulation;Moist Heat;Iontophoresis 4mg /ml Dexamethasone;Ultrasound;DME Instruction;Functional mobility training;Therapeutic activities;Therapeutic exercise;Neuromuscular re-education;Patient/family education;Manual techniques;Passive range of motion;Taping    PT Home Exercise Plan pendulums, scap retraction, table slide, wand flex, wand ER;Access Code: [email or web address] by: Shanon Brow CarrollExercisesSeated Shoulder Flexion Towel Slide at Table Top - 2 x daily - 7 x weekly - 3 sets - 10 repsSupine Shoulder Flexion Extension AAROM with Dowel - 2 x daily - 7 x weekly - 3 sets - 10 repsSupine Shoulder External Rotation in 45 Degrees Abduction AAROM with Dowel - 2 x daily - 7 x weekly - 3 sets - 10 repsSeated Scapular Retraction - 2 x daily - 7 x weekly - 3 sets - 10 repsCircular Shoulder Pendulum with Table Support - 2 x daily - 7 x weekly - 3 sets - 10 reps    Consulted and Agree with Plan of Care Patient           Patient will benefit from skilled therapeutic intervention in order to improve the following deficits and impairments:  Decreased endurance, Decreased mobility, Increased muscle spasms, Decreased activity tolerance, Pain, Impaired UE functional use, Decreased range of motion, Increased edema  Visit Diagnosis: Acute pain of left shoulder  Stiffness of right shoulder, not elsewhere classified  Muscle weakness (generalized)  Localized edema     Problem List Patient Active Problem List   Diagnosis Date Noted  . Full thickness tear of left subscapularis tendon 04/12/2020  . Traumatic tear of supraspinatus tendon of left shoulder 04/12/2020  . Pseudoaneurysm  of intra-abdominal artery ([place]) - gastroduodenal 03/11/2019  . Mass of pancreas 03/11/2019  . Microcytic anemia   . 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 Hypertension 11/04/2014    Darrel Hoover  PT 07/25/2020, 10:12 AM  [facility] [address] [place], [place], [address] Phone: [phone]   Fax:  [phone]  Name: ABDULLA POOLEY Sr. MRN: [id number] Date of Birth: 1961/08/08

## 2020-07-25 NOTE — Patient Instructions (Signed)
Isometrics all planes LT shoulder 5-10 reps , 5-10 sec hold., daily

## 2020-08-01 ENCOUNTER — Encounter: Payer: Self-pay | Admitting: Physical Therapy

## 2020-08-01 ENCOUNTER — Other Ambulatory Visit: Payer: Self-pay

## 2020-08-01 ENCOUNTER — Ambulatory Visit: Payer: Medicaid Other | Admitting: Physical Therapy

## 2020-08-01 DIAGNOSIS — M6281 Muscle weakness (generalized): Secondary | ICD-10-CM

## 2020-08-01 DIAGNOSIS — M25611 Stiffness of right shoulder, not elsewhere classified: Secondary | ICD-10-CM

## 2020-08-01 DIAGNOSIS — R600 Localized edema: Secondary | ICD-10-CM

## 2020-08-01 DIAGNOSIS — M25512 Pain in left shoulder: Secondary | ICD-10-CM | POA: Diagnosis not present

## 2020-08-01 NOTE — Therapy (Addendum)
[place], [place], [address] Phone: [phone]   Fax:  [phone]  Physical Therapy Treatment/Discharge   Patient Details  Name: Joe VICKREY Sr. MRN: [id number] Date of Birth: Oct 29, 1961 Referring Provider (PT): Tiney Rouge PA-C   Encounter Date: 08/01/2020   PT End of Session - 08/01/20 1103    Visit Number 6    Number of Visits 16    Date for PT Re-Evaluation 08/31/20    Authorization Type ask for more visits !    PT Start Time 1100    PT Stop Time 1142    PT Time Calculation (min) 42 min    Activity Tolerance Patient tolerated treatment well    Behavior During Therapy WFL for tasks assessed/performed           Past Medical History:  Diagnosis Date  . Diabetes mellitus without complication ([place])   . Hypertension 2016  . Left shoulder pain   . Mass of pancreas 03/11/2019  . Microcytic anemia   . 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 COLONOSCOPY WITH PROPOFOL N/A 11/08/2018   Procedure: COLONOSCOPY WITH PROPOFOL;  Surgeon: Lavena Bullion, DO;  Location: [facility];  Service: Gastroenterology;  Laterality: N/A;  . ESOPHAGOGASTRODUODENOSCOPY (EGD) WITH PROPOFOL N/A 11/06/2018   Procedure: ESOPHAGOGASTRODUODENOSCOPY (EGD) WITH PROPOFOL;  Surgeon: Carol Ada, MD;  Location: [facility];  Service: Endoscopy;  Laterality: N/A;  . EUS  11/06/2018   Procedure: FULL UPPER ENDOSCOPIC ULTRASOUND (EUS) RADIAL;  Surgeon: Carol Ada, MD;  Location: [facility];  Service: Endoscopy;;  . GIVENS CAPSULE STUDY N/A 11/08/2018   Procedure: GIVENS CAPSULE STUDY;  Surgeon: Lavena Bullion, DO;  Location: [facility];  Service: Gastroenterology;  Laterality: N/A;  . IR ANGIOGRAM SELECTIVE EACH ADDITIONAL VESSEL  11/10/2018  . IR ANGIOGRAM VISCERAL SELECTIVE  11/10/2018  . IR ANGIOGRAM VISCERAL SELECTIVE  11/10/2018   . IR EMBO ART  VEN HEMORR LYMPH EXTRAV  INC GUIDE ROADMAPPING  11/10/2018  . IR US GUIDE VASC ACCESS RIGHT  11/10/2018  . KNEE SURGERY Bilateral 1985   Arthroscopic for torn cartilage and ligaments.    Marland Kitchen SHOULDER OPEN ROTATOR CUFF REPAIR Left 05/10/2020   Procedure: LEFT SHOULDER OPEN ROTATOR CUFF REPAIR AND BICEPS TENODESIS;  Surgeon: Leandrew Koyanagi, MD;  Location: [facility];  Service: Orthopedics;  Laterality: Left;    There were no vitals filed for this visit.   Subjective Assessment - 08/01/20 1102    Subjective Patient continues to have no complaints. He had a little pain after the last visit but overall he is doing well.    Pertinent History use of the shoulder    How long can you sit comfortably? N/A    How long can you stand comfortably? N/A    How long can you walk comfortably? N/A    Diagnostic tests nothing post -op    Patient Stated Goals to go back to work    Currently in Pain? No/denies                             [facility] Adult PT Treatment/Exercise - 08/01/20 0001      Shoulder Exercises: Supine   Other Supine Exercises wand flexion 2x10 full range 1lb; protraction 1lb       Shoulder Exercises: Sidelying   External Rotation Limitations 2x10 with  towel       Shoulder Exercises: Standing   Internal Rotation 20 reps    Theraband Level (Shoulder Internal Rotation) Level 1 (Yellow)    Extension 20 reps    Theraband Level (Shoulder Extension) Level 2 (Red)    Retraction 20 reps    Theraband Level (Shoulder Retraction) Level 2 (Red)    Other Standing Exercises ue ranger into flexion x20                   PT Education - 08/01/20 1103    Education Details continue with progressive strengthening    Person(s) Educated Patient    Methods Explanation;Demonstration;Tactile cues;Verbal cues    Comprehension Returned demonstration;Verbal cues required;Tactile cues required;Verbalized understanding            PT Short Term Goals -  07/25/20 0959      PT SHORT TERM GOAL #1   Title Patient will increase gross left shoulder strength to 4+/5    Baseline Not assessed but good isometric resistance with min soreness    Time 3    Period Weeks    Status On-going      PT SHORT TERM GOAL #2   Title Patient will be independent with basic HEP    Baseline perfroming base HEP    Status Achieved      PT SHORT TERM GOAL #3   Title Patient will increase passive left shoulder flexion and Er to full    Baseline essentially full ROM    Status Achieved             PT Long Term Goals - 07/20/20 0810      PT LONG TERM GOAL #1   Title Patient will place a box > 20 lbs overhead in order to return to work if cleared by MD    Baseline have not started active strengthening    Time 6    Period Weeks    Status On-going      PT LONG TERM GOAL #2   Title Patient will demonstrate full AROM with all motions in order to perfrom ADL's    Baseline limited active motion with all motions    Time 6    Period Weeks    Status On-going    Target Date 08/31/20                 Plan - 08/01/20 1113    Clinical Impression Statement Therapy added active strengthening in internal and external rotation. he had no significant increase in pain. He had full passive ROM. He was was advised to continue with HEP.    Personal Factors and Comorbidities Comorbidity 1;Profession    Examination-Activity Limitations Locomotion Level;Reach Overhead;Carry    Examination-Participation Restrictions Cleaning;Community Activity;Occupation    Stability/Clinical Decision Making Evolving/Moderate complexity    Clinical Decision Making Moderate    Rehab Potential Excellent    PT Frequency 2x / week    PT Duration 6 weeks    PT Treatment/Interventions ADLs/Self Care Home Management;Cryotherapy;Electrical Stimulation;Moist Heat;Iontophoresis 60m/ml Dexamethasone;Ultrasound;DME Instruction;Functional mobility training;Therapeutic activities;Therapeutic  exercise;Neuromuscular re-education;Patient/family education;Manual techniques;Passive range of motion;Taping    PT Next Visit Plan continue per protocol with PROM, AAROM, isometrics, scap strengthening, progress AROM as tolerated per protocol and update HEP prn review tolerance to active strengthening    PT Home Exercise Plan pendulums, scap retraction, table slide, wand flex, wand ER;Access Code: [email or web address]: DShanon BrowCarrollExercisesSeated Shoulder Flexion Towel Slide at Table Top - 2 x daily - 7  x weekly - 3 sets - 10 repsSupine Shoulder Flexion Extension AAROM with Dowel - 2 x daily - 7 x weekly - 3 sets - 10 repsSupine Shoulder External Rotation in 45 Degrees Abduction AAROM with Dowel - 2 x daily - 7 x weekly - 3 sets - 10 repsSeated Scapular Retraction - 2 x daily - 7 x weekly - 3 sets - 10 repsCircular Shoulder Pendulum with Table Support - 2 x daily - 7 x weekly - 3 sets - 10 reps    Consulted and Agree with Plan of Care Patient           Patient will benefit from skilled therapeutic intervention in order to improve the following deficits and impairments:  Decreased endurance, Decreased mobility, Increased muscle spasms, Decreased activity tolerance, Pain, Impaired UE functional use, Decreased range of motion, Increased edema  Visit Diagnosis: Acute pain of left shoulder  Stiffness of right shoulder, not elsewhere classified  Muscle weakness (generalized)  Localized edema  PHYSICAL THERAPY DISCHARGE SUMMARY  Visits from Start of Care: 6   Current functional level related to goals / functional outcomes: Improved range and able to reach a shlef overhead but without weight   Remaining deficits: Patient discharged by MD ( per patient)  before progressing exercise program. Had basic HEP but still had  weakness    Education / Equipment: HEP Plan: Patient agrees to discharge.  Patient goals were not met. Patient is being discharged due to the physician's request.  ?????        Problem List Patient Active Problem List   Diagnosis Date Noted  . Full thickness tear of left subscapularis tendon 04/12/2020  . Traumatic tear of supraspinatus tendon of left shoulder 04/12/2020  . 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 Mass of pancreas 03/11/2019  . Microcytic anemia   . 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 Hypertension 11/04/2014    Carney Living PT DPT  08/01/2020, 4:47 PM  [facility] [address] [place], [place], [address] Phone: [phone]   Fax:  [phone]  Name: Joe TASSIN Sr. MRN: [id number] Date of Birth: 1961/08/16

## 2020-08-04 ENCOUNTER — Encounter: Payer: Self-pay | Admitting: Orthopaedic Surgery

## 2020-08-04 ENCOUNTER — Ambulatory Visit (INDEPENDENT_AMBULATORY_CARE_PROVIDER_SITE_OTHER): Payer: Medicaid Other | Admitting: Physician Assistant

## 2020-08-04 VITALS — Ht 70.5 in | Wt 162.0 lb

## 2020-08-04 DIAGNOSIS — Z9889 Other specified postprocedural states: Secondary | ICD-10-CM

## 2020-08-04 DIAGNOSIS — S46812D Strain of other muscles, fascia and tendons at shoulder and upper arm level, left arm, subsequent encounter: Secondary | ICD-10-CM

## 2020-08-04 NOTE — Progress Notes (Signed)
Post-Op Visit Note   Patient: Joe RIEMAN Sr.           Date of Birth: [birth year]           MRN: [id number] Visit Date: 08/04/2020 PCP: Antony Blackbird, MD   Assessment & Plan:  Chief Complaint:  Chief Complaint  Patient presents with  . Left Shoulder - Follow-up    Left shoulder rotator cuff tear repair 05/10/2020   Visit Diagnoses:  1. Full thickness tear of left subscapularis tendon, subsequent encounter   2. S/P left rotator cuff repair     Plan: Patient is a pleasant 59 year old gentleman who comes in today about 12 weeks out left shoulder open subscapularis repair 05/10/2020.  He has been doing fairly well.  He has been in physical therapy making progress with range of motion and strength.  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  Left shoulder exam shows near full active range of motion.  3/5 strength with resisted belly press, bear hug and lift off.  He is neurovascular intact distally.  At this point, he will continue with range of motion and strengthening exercises.  We will allow him to return to work this coming Monday, 08/07/2020 lifting no more than 20 pounds or less if he is having trouble lifting up to 20 pounds.  He will follow up with [place] in 6 weeks time for repeat evaluation.  Call with concerns or questions.  Follow-Up Instructions: Return in about 6 weeks (around 09/15/2020).   Orders:  No orders of the defined types were placed in this encounter.  No orders of the defined types were placed in this encounter.   Imaging: No new imaging  PMFS History: Patient Active Problem List   Diagnosis Date Noted  . Full thickness tear of left subscapularis tendon 04/12/2020  . Traumatic tear of supraspinatus tendon of left shoulder 04/12/2020  . 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 Mass of pancreas 03/11/2019  . Microcytic anemia   . Chronic left-sided low back pain  with left-sided sciatica 07/08/2018  . Hypertension 11/04/2014   Past Medical History:  Diagnosis Date  . Diabetes mellitus without complication ([place])   . Hypertension 2016  . Left shoulder pain   . Mass of pancreas 03/11/2019  . Microcytic anemia   . 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 Cancer Mother        uncertain what was wrong--mother would not share, but gradual decline.  . Hypertension Mother   . Hypertension Father     Past Surgical History:  Procedure Laterality Date  . COLONOSCOPY WITH PROPOFOL N/A 11/08/2018   Procedure: COLONOSCOPY WITH PROPOFOL;  Surgeon: Lavena Bullion, DO;  Location: [facility];  Service: Gastroenterology;  Laterality: N/A;  . ESOPHAGOGASTRODUODENOSCOPY (EGD) WITH PROPOFOL N/A 11/06/2018   Procedure: ESOPHAGOGASTRODUODENOSCOPY (EGD) WITH PROPOFOL;  Surgeon: Carol Ada, MD;  Location: [facility];  Service: Endoscopy;  Laterality: N/A;  . EUS  11/06/2018   Procedure: FULL UPPER ENDOSCOPIC ULTRASOUND (EUS) RADIAL;  Surgeon: Carol Ada, MD;  Location: [facility];  Service: Endoscopy;;  . GIVENS CAPSULE STUDY N/A 11/08/2018   Procedure: GIVENS CAPSULE STUDY;  Surgeon: Lavena Bullion, DO;  Location: [facility];  Service: Gastroenterology;  Laterality: N/A;  . IR ANGIOGRAM SELECTIVE EACH ADDITIONAL VESSEL  11/10/2018  . IR ANGIOGRAM  VISCERAL SELECTIVE  11/10/2018  . IR ANGIOGRAM VISCERAL SELECTIVE  11/10/2018  . IR EMBO ART  VEN HEMORR LYMPH EXTRAV  INC GUIDE ROADMAPPING  11/10/2018  . IR US GUIDE VASC ACCESS RIGHT  11/10/2018  . KNEE SURGERY Bilateral 1985   Arthroscopic for torn cartilage and ligaments.    Marland Kitchen SHOULDER OPEN ROTATOR CUFF REPAIR Left 05/10/2020   Procedure: LEFT SHOULDER OPEN ROTATOR CUFF REPAIR AND BICEPS TENODESIS;  Surgeon: Leandrew Koyanagi, MD;  Location: [facility];  Service: Orthopedics;  Laterality: Left;   Social History    Occupational History  . Occupation: Packing/stacking/loading    Comment: soaps, shaving implements, etc.  Tobacco Use  . Smoking status: Never Smoker  . Smokeless tobacco: Never Used  Vaping Use  . Vaping Use: Never used  Substance and Sexual Activity  . Alcohol use: Yes    Comment: 6 pack of beer daily  . Drug use: No  . Sexual activity: Yes

## 2020-08-08 ENCOUNTER — Ambulatory Visit: Payer: Medicaid Other | Admitting: Physical Therapy

## 2020-08-15 ENCOUNTER — Encounter: Payer: Medicaid Other | Admitting: Physical Therapy

## 2020-09-15 ENCOUNTER — Other Ambulatory Visit: Payer: Self-pay

## 2020-09-15 ENCOUNTER — Encounter: Payer: Self-pay | Admitting: Orthopaedic Surgery

## 2020-09-15 ENCOUNTER — Ambulatory Visit (INDEPENDENT_AMBULATORY_CARE_PROVIDER_SITE_OTHER): Payer: Medicaid Other | Admitting: Orthopaedic Surgery

## 2020-09-15 DIAGNOSIS — Z9889 Other specified postprocedural states: Secondary | ICD-10-CM | POA: Diagnosis not present

## 2020-09-15 DIAGNOSIS — S46812D Strain of other muscles, fascia and tendons at shoulder and upper arm level, left arm, subsequent encounter: Secondary | ICD-10-CM

## 2020-09-15 NOTE — Progress Notes (Signed)
Post-Op Visit Note   Patient: Joe HANRAHAN Sr.           Date of Birth: 1961/05/28           MRN: [id number] Visit Date: 09/15/2020 PCP: Antony Blackbird, MD   Assessment & Plan:  Chief Complaint:  Chief Complaint  Patient presents with  . Left Shoulder - Pain   Visit Diagnoses:  1. Full thickness tear of left subscapularis tendon, subsequent encounter   2. S/P left rotator cuff repair     Plan: Patient is a pleasant 59 year old gentleman who comes in today little over 4 months out left shoulder open subscapularis rotator cuff repair.  He has been back at work still unable to lift anything heavier than 20 pounds.  He notes that he continues to work on a home exercise program.  He does admit that he still has a fair amount of weakness and endurance.  He notes increased pain and occasional burning sensations at night while trying to sleep.  He takes Tylenol for this which does not seem to help very much.  Examination of his left shoulder reveals full active range of motion all planes.  He has 3 out of 5 strength with resisted bearhug, belly press and Gerber liftoff.  He is neurovascularly intact distally.  At this point, he will really try to work on strengthening exercises at home on a daily basis.  He will continue his current work restrictions for another 2 months.  He will follow-up with [place] at that point in time for repeat evaluation.  He does note that he has Robaxin at home and would like to try this at night for his pain.  He will call us if this does not help we will send something else in.  Follow-Up Instructions: Return in about 2 months (around 11/15/2020).   Orders:  No orders of the defined types were placed in this encounter.  No orders of the defined types were placed in this encounter.   Imaging: No new imaging  PMFS History: Patient Active Problem List   Diagnosis Date Noted  . Full thickness tear of left subscapularis tendon 04/12/2020  . Traumatic tear of  supraspinatus tendon of left shoulder 04/12/2020  . 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 Mass of pancreas 03/11/2019  . Microcytic anemia   . 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 Hypertension 11/04/2014   Past Medical History:  Diagnosis Date  . Diabetes mellitus without complication ([place])   . Hypertension 2016  . Left shoulder pain   . Mass of pancreas 03/11/2019  . Microcytic anemia   . 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 Cancer Mother        uncertain what was wrong--mother would not share, but gradual decline.  . Hypertension Mother   . Hypertension Father     Past Surgical History:  Procedure Laterality Date  . COLONOSCOPY WITH PROPOFOL N/A 11/08/2018   Procedure: COLONOSCOPY WITH PROPOFOL;  Surgeon: Lavena Bullion, DO;  Location: [facility];  Service: Gastroenterology;  Laterality: N/A;  . ESOPHAGOGASTRODUODENOSCOPY (EGD) WITH PROPOFOL N/A 11/06/2018   Procedure: ESOPHAGOGASTRODUODENOSCOPY (EGD) WITH PROPOFOL;  Surgeon: Carol Ada, MD;  Location: [facility];  Service: Endoscopy;  Laterality: N/A;  . EUS  11/06/2018   Procedure: FULL UPPER ENDOSCOPIC ULTRASOUND (EUS) RADIAL;  Surgeon: Carol Ada, MD;  Location: [facility];  Service: Endoscopy;;  . GIVENS CAPSULE STUDY N/A 11/08/2018   Procedure: GIVENS CAPSULE STUDY;  Surgeon: Lavena Bullion, DO;  Location: [place];  Service: Gastroenterology;  Laterality: N/A;  . IR ANGIOGRAM SELECTIVE EACH ADDITIONAL VESSEL  11/10/2018  . IR ANGIOGRAM VISCERAL SELECTIVE  11/10/2018  . IR ANGIOGRAM VISCERAL SELECTIVE  11/10/2018  . IR EMBO ART  VEN HEMORR LYMPH EXTRAV  INC GUIDE ROADMAPPING  11/10/2018  . IR US GUIDE VASC ACCESS RIGHT  11/10/2018  . KNEE SURGERY Bilateral 1985   Arthroscopic for torn cartilage and ligaments.    Marland Kitchen SHOULDER OPEN ROTATOR CUFF REPAIR Left  05/10/2020   Procedure: LEFT SHOULDER OPEN ROTATOR CUFF REPAIR AND BICEPS TENODESIS;  Surgeon: Leandrew Koyanagi, MD;  Location: [facility];  Service: Orthopedics;  Laterality: Left;   Social History   Occupational History  . Occupation: Packing/stacking/loading    Comment: soaps, shaving implements, etc.  Tobacco Use  . Smoking status: Never Smoker  . Smokeless tobacco: Never Used  Vaping Use  . Vaping Use: Never used  Substance and Sexual Activity  . Alcohol use: Yes    Comment: 6 pack of beer daily  . Drug use: No  . Sexual activity: Yes

## 2020-10-12 ENCOUNTER — Ambulatory Visit: Payer: Medicaid Other

## 2020-10-19 ENCOUNTER — Ambulatory Visit: Payer: Self-pay | Attending: Internal Medicine

## 2020-10-19 DIAGNOSIS — Z23 Encounter for immunization: Secondary | ICD-10-CM

## 2020-10-19 NOTE — Progress Notes (Signed)
   Covid-19 Vaccination Clinic  Name:  Joe [name]    MRN: [id number] DOB: 1961-04-30  10/19/2020  [name] was observed post Covid-19 immunization for 15 minutes without incident. He was provided with Vaccine Information Sheet and instruction to access the V-Safe system.   [name] was instructed to call 911 with any severe reactions post vaccine: Marland Kitchen Difficulty breathing  . Swelling of face and throat  . A fast heartbeat  . A bad rash all over body  . Dizziness and weakness   Immunizations Administered    Name Date Dose VIS Date Route   Pfizer COVID-19 Vaccine 10/19/2020  3:28 PM 0.3 mL 08/23/2020 Intramuscular   Manufacturer: [place]   Lot: [id number]   [place]: [id number]

## 2020-11-15 ENCOUNTER — Ambulatory Visit (INDEPENDENT_AMBULATORY_CARE_PROVIDER_SITE_OTHER): Payer: Medicaid Other | Admitting: Orthopaedic Surgery

## 2020-11-15 ENCOUNTER — Encounter: Payer: Self-pay | Admitting: Orthopaedic Surgery

## 2020-11-15 ENCOUNTER — Other Ambulatory Visit: Payer: Self-pay

## 2020-11-15 DIAGNOSIS — S46812D Strain of other muscles, fascia and tendons at shoulder and upper arm level, left arm, subsequent encounter: Secondary | ICD-10-CM

## 2020-11-15 NOTE — Progress Notes (Signed)
Post-Op Visit Note   Patient: Joe SARVIS Sr.           Date of Birth: December 13, 1960           MRN: [id number] Visit Date: 11/15/2020 PCP: Antony Blackbird, MD (Inactive)   Assessment & Plan:  Chief Complaint:  Chief Complaint  Patient presents with  . Left Shoulder - Follow-up   Visit Diagnoses:  1. Full thickness tear of left subscapularis tendon, subsequent encounter     Plan:   Mr. Hara is 41-month status post left subscapularis repair. He is overall doing well. He has been back to work for couple months now. He describes that he has no pain with ADLs. He has some stiffness in certain limitations that he has learned to live with. Overall he is very happy with his recovery and outcome. He works at [employer].  Surgical scar is fully healed. He has near full range of motion without any apparent pain. Manual muscle testing of subscap shows 4/5 strength without pain.  Impression is 6 months status post left subscapularis repair. He is doing well in terms of recovery and at this point he is released to activity as tolerated. Questions encouraged and answered. Follow-up as needed.  Follow-Up Instructions: Return if symptoms worsen or fail to improve.   Orders:  No orders of the defined types were placed in this encounter.  No orders of the defined types were placed in this encounter.   Imaging: No results found.  PMFS History: Patient Active Problem List   Diagnosis Date Noted  . Full thickness tear of left subscapularis tendon 04/12/2020  . Traumatic tear of supraspinatus tendon of left shoulder 04/12/2020  . 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 Mass of pancreas 03/11/2019  . Microcytic anemia   . 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 Hypertension 11/04/2014   Past Medical History:  Diagnosis Date  . Diabetes mellitus without complication ([place])   . Hypertension 2016  . Left shoulder pain    . Mass of pancreas 03/11/2019  . Microcytic anemia   . 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 Cancer Mother        uncertain what was wrong--mother would not share, but gradual decline.  . Hypertension Mother   . Hypertension Father     Past Surgical History:  Procedure Laterality Date  . COLONOSCOPY WITH PROPOFOL N/A 11/08/2018   Procedure: COLONOSCOPY WITH PROPOFOL;  Surgeon: Lavena Bullion, DO;  Location: [facility];  Service: Gastroenterology;  Laterality: N/A;  . ESOPHAGOGASTRODUODENOSCOPY (EGD) WITH PROPOFOL N/A 11/06/2018   Procedure: ESOPHAGOGASTRODUODENOSCOPY (EGD) WITH PROPOFOL;  Surgeon: Carol Ada, MD;  Location: [facility];  Service: Endoscopy;  Laterality: N/A;  . EUS  11/06/2018   Procedure: FULL UPPER ENDOSCOPIC ULTRASOUND (EUS) RADIAL;  Surgeon: Carol Ada, MD;  Location: [facility];  Service: Endoscopy;;  . GIVENS CAPSULE STUDY N/A 11/08/2018   Procedure: GIVENS CAPSULE STUDY;  Surgeon: Lavena Bullion, DO;  Location: [facility];  Service: Gastroenterology;  Laterality: N/A;  . IR ANGIOGRAM SELECTIVE EACH ADDITIONAL VESSEL  11/10/2018  . IR ANGIOGRAM VISCERAL SELECTIVE  11/10/2018  . IR ANGIOGRAM VISCERAL SELECTIVE  11/10/2018  . IR EMBO ART  VEN HEMORR LYMPH EXTRAV  INC GUIDE ROADMAPPING  11/10/2018  . IR US GUIDE VASC ACCESS RIGHT  11/10/2018  . KNEE SURGERY Bilateral 1985  Arthroscopic for torn cartilage and ligaments.    Marland Kitchen SHOULDER OPEN ROTATOR CUFF REPAIR Left 05/10/2020   Procedure: LEFT SHOULDER OPEN ROTATOR CUFF REPAIR AND BICEPS TENODESIS;  Surgeon: Leandrew Koyanagi, MD;  Location: [facility];  Service: Orthopedics;  Laterality: Left;   Social History   Occupational History  . Occupation: Packing/stacking/loading    Comment: soaps, shaving implements, etc.  Tobacco Use  . Smoking status: Never Smoker  . Smokeless tobacco: Never Used  Vaping Use  .  Vaping Use: Never used  Substance and Sexual Activity  . Alcohol use: Yes    Comment: 6 pack of beer daily  . Drug use: No  . Sexual activity: Yes

## 2021-02-21 ENCOUNTER — Encounter: Payer: Self-pay | Admitting: Orthopaedic Surgery

## 2021-02-27 ENCOUNTER — Ambulatory Visit (INDEPENDENT_AMBULATORY_CARE_PROVIDER_SITE_OTHER): Payer: Medicaid Other

## 2021-02-27 ENCOUNTER — Encounter: Payer: Self-pay | Admitting: Orthopaedic Surgery

## 2021-02-27 ENCOUNTER — Other Ambulatory Visit: Payer: Self-pay

## 2021-02-27 ENCOUNTER — Ambulatory Visit (INDEPENDENT_AMBULATORY_CARE_PROVIDER_SITE_OTHER): Payer: Medicaid Other | Admitting: Orthopaedic Surgery

## 2021-02-27 VITALS — Ht 70.5 in | Wt 162.0 lb

## 2021-02-27 DIAGNOSIS — M25512 Pain in left shoulder: Secondary | ICD-10-CM

## 2021-02-27 MED ORDER — TRAMADOL HCL 50 MG PO TABS: 50.0000 mg | ORAL_TABLET | Freq: Three times a day (TID) | ORAL | 0 refills | Status: DC | PRN

## 2021-02-27 NOTE — Progress Notes (Signed)
Office Visit Note   Patient: Joe CLUBB Sr.           Date of Birth: [birth year]           MRN: [id number] Visit Date: 02/27/2021              Requested by: No referring provider defined for this encounter. PCP: Antony Blackbird, MD (Inactive)   Assessment & Plan: Visit Diagnoses:  1. Acute pain of left shoulder     Plan: Impression is left shoulder questionable rupture left biceps tendon from previous biceps tenodesis.  At this point, we will obtain an MR arthrogram of the left shoulder to further assess this as well as other structural abnormalities.  He will follow-up with [place] once this is been completed.  In the meantime, we will provide him with a work note for light duty only no lifting more than 5 pounds left upper extremity.  Call with concerns or questions in the meantime.  Follow-Up Instructions: Return for after MRI.   Orders:  Orders Placed This Encounter  Procedures  . XR Shoulder Left   No orders of the defined types were placed in this encounter.     Procedures: No procedures performed   Clinical Data: No additional findings.   Subjective: Chief Complaint  Patient presents with  . Left Shoulder - Pain    HPI patient is a pleasant 60 year old gentleman who comes in today 9 months status left shoulder open supraspinatus and subscapularis tendon repairs as well as biceps tenodesis.  He was doing fairly well until last week.  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  He comes in today for further evaluation and treatment recommendation.  Majority of his pain is to the deltoid.  He describes this as a constant ache worse with raising his arm.  He notes slightly decreased strength.  He does not note any improvement of range of motion following the pop.  He has not been at work since this happened but this is not being filed under [employer].  Review of Systems as detailed in  HPI.  All others reviewed and are negative.   Objective: Vital Signs: Ht 5' 10.5" (1.791 m)   Wt 162 lb (73.5 kg)   BMI 22.92 kg/m   Physical Exam well-developed and well-nourished gentleman in no acute distress.  Alert and oriented x3.  Ortho Exam left shoulder exam reveals near full active range of motion but pain at the extremes.  Positive empty can, belly press, speeds, Gerber liftoff.  3 out of 5 strength throughout.  Asymmetric contour of the shoulders with a small Popeye deformity on the left.  He is neurovascular intact distally.  Specialty Comments:  No specialty comments available.  Imaging: XR Shoulder Left  Result Date: 02/27/2021 No acute or structural abnormalities    PMFS History: Patient Active Problem List   Diagnosis Date Noted  . Full thickness tear of left subscapularis tendon 04/12/2020  . Traumatic tear of supraspinatus tendon of left shoulder 04/12/2020  . 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 Mass of pancreas 03/11/2019  . Microcytic anemia   . 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 Hypertension 11/04/2014   Past Medical History:  Diagnosis Date  . Diabetes mellitus without complication ([place])   . Hypertension 2016  . Left shoulder pain   . Mass of pancreas  03/11/2019  . Microcytic anemia   . 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 Cancer Mother        uncertain what was wrong--mother would not share, but gradual decline.  . Hypertension Mother   . Hypertension Father     Past Surgical History:  Procedure Laterality Date  . COLONOSCOPY WITH PROPOFOL N/A 11/08/2018   Procedure: COLONOSCOPY WITH PROPOFOL;  Surgeon: Lavena Bullion, DO;  Location: [facility];  Service: Gastroenterology;  Laterality: N/A;  . ESOPHAGOGASTRODUODENOSCOPY (EGD) WITH PROPOFOL N/A 11/06/2018   Procedure:  ESOPHAGOGASTRODUODENOSCOPY (EGD) WITH PROPOFOL;  Surgeon: Carol Ada, MD;  Location: [facility];  Service: Endoscopy;  Laterality: N/A;  . EUS  11/06/2018   Procedure: FULL UPPER ENDOSCOPIC ULTRASOUND (EUS) RADIAL;  Surgeon: Carol Ada, MD;  Location: [facility];  Service: Endoscopy;;  . GIVENS CAPSULE STUDY N/A 11/08/2018   Procedure: GIVENS CAPSULE STUDY;  Surgeon: Lavena Bullion, DO;  Location: [facility];  Service: Gastroenterology;  Laterality: N/A;  . IR ANGIOGRAM SELECTIVE EACH ADDITIONAL VESSEL  11/10/2018  . IR ANGIOGRAM VISCERAL SELECTIVE  11/10/2018  . IR ANGIOGRAM VISCERAL SELECTIVE  11/10/2018  . IR EMBO ART  VEN HEMORR LYMPH EXTRAV  INC GUIDE ROADMAPPING  11/10/2018  . IR US GUIDE VASC ACCESS RIGHT  11/10/2018  . KNEE SURGERY Bilateral 1985   Arthroscopic for torn cartilage and ligaments.    Marland Kitchen SHOULDER OPEN ROTATOR CUFF REPAIR Left 05/10/2020   Procedure: LEFT SHOULDER OPEN ROTATOR CUFF REPAIR AND BICEPS TENODESIS;  Surgeon: Leandrew Koyanagi, MD;  Location: [facility];  Service: Orthopedics;  Laterality: Left;   Social History   Occupational History  . Occupation: Packing/stacking/loading    Comment: soaps, shaving implements, etc.  Tobacco Use  . Smoking status: Never Smoker  . Smokeless tobacco: Never Used  Vaping Use  . Vaping Use: Never used  Substance and Sexual Activity  . Alcohol use: Yes    Comment: 6 pack of beer daily  . Drug use: No  . Sexual activity: Yes

## 2021-03-20 ENCOUNTER — Ambulatory Visit
Admission: RE | Admit: 2021-03-20 | Discharge: 2021-03-20 | Disposition: A | Discharge status: Home / Self Care | Payer: Medicaid Other | Source: Ambulatory Visit | Attending: Orthopaedic Surgery | Admitting: Orthopaedic Surgery

## 2021-03-20 ENCOUNTER — Other Ambulatory Visit: Payer: Self-pay

## 2021-03-20 DIAGNOSIS — M25512 Pain in left shoulder: Secondary | ICD-10-CM

## 2021-03-20 MED ORDER — IOPAMIDOL (ISOVUE-M 200) INJECTION 41%
12.0000 mL | Freq: Once | INTRAMUSCULAR | Status: AC
  Administered 2021-03-20: 12 mL via INTRA_ARTICULAR

## 2021-03-22 NOTE — Progress Notes (Signed)
Needs appt

## 2021-03-29 ENCOUNTER — Encounter: Payer: Self-pay | Admitting: Orthopaedic Surgery

## 2021-03-29 ENCOUNTER — Ambulatory Visit (INDEPENDENT_AMBULATORY_CARE_PROVIDER_SITE_OTHER): Payer: Medicaid Other | Admitting: Orthopaedic Surgery

## 2021-03-29 DIAGNOSIS — S46812A Strain of other muscles, fascia and tendons at shoulder and upper arm level, left arm, initial encounter: Secondary | ICD-10-CM

## 2021-03-29 DIAGNOSIS — Z9889 Other specified postprocedural states: Secondary | ICD-10-CM

## 2021-03-29 NOTE — Progress Notes (Signed)
Office Visit Note   Patient: Joe WINTERTON Sr.           Date of Birth: 12-03-1960           MRN: [id number] Visit Date: 03/29/2021              Requested by: No referring provider defined for this encounter. PCP: Antony Blackbird, MD (Inactive)   Assessment & Plan: Visit Diagnoses:  1. S/P left rotator cuff repair   2. Full thickness tear of left subscapularis tendon, initial encounter   3. Traumatic tear of supraspinatus tendon of left shoulder, initial encounter     Plan: MRI shows intact biceps tenodesis.  He does have recurrent tear of the supraspinatus and subscapularis with retraction to the glenoid.  These findings were reviewed with the patient in detail and I have recommended referral to Dr. Marlou Sa for discussion of surgical options as soon as possible.  He will remain on light duty until the foreseeable future.  Follow-Up Instructions: Return for needs appt with Dr. Marlou Sa.   Orders:  No orders of the defined types were placed in this encounter.  No orders of the defined types were placed in this encounter.     Procedures: No procedures performed   Clinical Data: No additional findings.   Subjective: Chief Complaint  Patient presents with  . Left Shoulder - Pain    Joe [name] returns today for MRI review of the left shoulder.  Reports no changes in his left shoulder pain or function or symptoms.  Continues to have symptoms with prolonged use of the left arm at work.   Review of Systems  Constitutional: Negative.   All other systems reviewed and are negative.    Objective: Vital Signs: There were no vitals taken for this visit.  Physical Exam Vitals and nursing note reviewed.  Constitutional:      Appearance: He is well-developed.  Pulmonary:     Effort: Pulmonary effort is normal.  Abdominal:     Palpations: Abdomen is soft.  Skin:    General: Skin is warm.  Neurological:     Mental Status: He is alert and oriented to person, place, and time.   Psychiatric:        Behavior: Behavior normal.        Thought Content: Thought content normal.        Judgment: Judgment normal.     Ortho Exam Left shoulder shows near normal passive range of motion with moderate pain.  Active abduction to 95 degrees with pain.  Positive belly press and positive liftoff.  Positive bearhug.  Empty can shows 4/5 strength strength with pain. Specialty Comments:  No specialty comments available.  Imaging: No results found.   PMFS History: Patient Active Problem List   Diagnosis Date Noted  . Full thickness tear of left subscapularis tendon 04/12/2020  . Traumatic tear of supraspinatus tendon of left shoulder 04/12/2020  . 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 Mass of pancreas 03/11/2019  . Microcytic anemia   . 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 Hypertension 11/04/2014   Past Medical History:  Diagnosis Date  . Diabetes mellitus without complication ([place])   . Hypertension 2016  . Left shoulder pain   . Mass of pancreas 03/11/2019  . Microcytic anemia   . 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 Cancer Mother  uncertain what was wrong--mother would not share, but gradual decline.  . Hypertension Mother   . Hypertension Father     Past Surgical History:  Procedure Laterality Date  . COLONOSCOPY WITH PROPOFOL N/A 11/08/2018   Procedure: COLONOSCOPY WITH PROPOFOL;  Surgeon: Lavena Bullion, DO;  Location: [facility];  Service: Gastroenterology;  Laterality: N/A;  . ESOPHAGOGASTRODUODENOSCOPY (EGD) WITH PROPOFOL N/A 11/06/2018   Procedure: ESOPHAGOGASTRODUODENOSCOPY (EGD) WITH PROPOFOL;  Surgeon: Carol Ada, MD;  Location: [facility];  Service: Endoscopy;  Laterality: N/A;  . EUS  11/06/2018   Procedure: FULL UPPER ENDOSCOPIC ULTRASOUND (EUS) RADIAL;  Surgeon: Carol Ada, MD;  Location: [facility];  Service: Endoscopy;;  . GIVENS CAPSULE STUDY N/A 11/08/2018   Procedure: GIVENS CAPSULE STUDY;  Surgeon: Lavena Bullion, DO;  Location: [facility];  Service: Gastroenterology;  Laterality: N/A;  . IR ANGIOGRAM SELECTIVE EACH ADDITIONAL VESSEL  11/10/2018  . IR ANGIOGRAM VISCERAL SELECTIVE  11/10/2018  . IR ANGIOGRAM VISCERAL SELECTIVE  11/10/2018  . IR EMBO ART  VEN HEMORR LYMPH EXTRAV  INC GUIDE ROADMAPPING  11/10/2018  . IR US GUIDE VASC ACCESS RIGHT  11/10/2018  . KNEE SURGERY Bilateral 1985   Arthroscopic for torn cartilage and ligaments.    Marland Kitchen SHOULDER OPEN ROTATOR CUFF REPAIR Left 05/10/2020   Procedure: LEFT SHOULDER OPEN ROTATOR CUFF REPAIR AND BICEPS TENODESIS;  Surgeon: Leandrew Koyanagi, MD;  Location: [facility];  Service: Orthopedics;  Laterality: Left;   Social History   Occupational History  . Occupation: Packing/stacking/loading    Comment: soaps, shaving implements, etc.  Tobacco Use  . Smoking status: Never Smoker  . Smokeless tobacco: Never Used  Vaping Use  . Vaping Use: Never used  Substance and Sexual Activity  . Alcohol use: Yes    Comment: 6 pack of beer daily  . Drug use: No  . Sexual activity: Yes

## 2021-03-30 ENCOUNTER — Telehealth: Payer: Self-pay | Admitting: Orthopaedic Surgery

## 2021-03-30 NOTE — Telephone Encounter (Signed)
Called and advised pt we will put a copy at the front desk for pick up

## 2021-03-30 NOTE — Telephone Encounter (Signed)
Pt called stating he had an appt 03/29/21 and needs his work note to be fixed to say light duty; pt would like to have this left at the front desk and wants to be notified when ready please.   [phone]

## 2021-03-30 NOTE — Telephone Encounter (Signed)
Work note from yesterday already says Light duty. Does he just need a copy or ?  Called patient no answer LMOM.

## 2021-03-30 NOTE — Telephone Encounter (Signed)
Pt called stating the note from yesterday does not say it; he states when he originally came and got a note it had light duty but not this last note. He would like it left at the front desk.   [phone]

## 2021-03-30 NOTE — Telephone Encounter (Signed)
yes

## 2021-03-30 NOTE — Telephone Encounter (Signed)
Is this okay?

## 2021-04-11 ENCOUNTER — Ambulatory Visit (INDEPENDENT_AMBULATORY_CARE_PROVIDER_SITE_OTHER): Payer: Medicaid Other | Admitting: Orthopedic Surgery

## 2021-04-11 ENCOUNTER — Other Ambulatory Visit: Payer: Self-pay

## 2021-04-11 DIAGNOSIS — S46812A Strain of other muscles, fascia and tendons at shoulder and upper arm level, left arm, initial encounter: Secondary | ICD-10-CM

## 2021-04-13 ENCOUNTER — Encounter: Payer: Self-pay | Admitting: Orthopedic Surgery

## 2021-04-13 DIAGNOSIS — S46812A Strain of other muscles, fascia and tendons at shoulder and upper arm level, left arm, initial encounter: Secondary | ICD-10-CM | POA: Diagnosis not present

## 2021-04-13 MED ORDER — BUPIVACAINE HCL 0.5 % IJ SOLN
9.0000 mL | INTRAMUSCULAR | Status: AC | PRN
  Administered 2021-04-13: 9 mL via INTRA_ARTICULAR

## 2021-04-13 MED ORDER — LIDOCAINE HCL 1 % IJ SOLN
5.0000 mL | INTRAMUSCULAR | Status: AC | PRN
  Administered 2021-04-13: 5 mL

## 2021-04-13 MED ORDER — METHYLPREDNISOLONE ACETATE 40 MG/ML IJ SUSP
40.0000 mg | INTRAMUSCULAR | Status: AC | PRN
  Administered 2021-04-13: 40 mg via INTRA_ARTICULAR

## 2021-04-13 NOTE — Progress Notes (Signed)
Office Visit Note   Patient: Joe BRANDL Sr.           Date of Birth: 05/28/1961           MRN: [id number] Visit Date: 04/11/2021 Requested by: No referring provider defined for this encounter. PCP: Antony Blackbird, MD (Inactive)  Subjective: Chief Complaint  Patient presents with   Left Shoulder - Injury    HPI: Joe [name] is a 60 year old patient with left shoulder pain.  Had prior surgery several months ago and it did get better but then started popping and then he started having difficulties with ADLs.  He is right-hand dominant.  He is working light duty doing Proofreader work.  Pain is a bigger problem for him than weakness.  He has had no injections in the shoulder since surgery.  MRI scan is reviewed and it shows full-thickness full width tear of the supraspinatus with more than 3 cm of retraction.  There is also full-thickness retracted tearing of the subscapularis tendon to the level of the glenoid particularly the upper fibers.  There is also supraspinatus and infraspinatus atrophy.  Biceps tenodesis has been performed.              ROS: All systems reviewed are negative as they relate to the chief complaint within the history of present illness.  Patient denies  fevers or chills.   Assessment & Plan: Visit Diagnoses:  1. Full thickness tear of left subscapularis tendon, initial encounter     Plan: Impression is left shoulder pain with early rotator cuff arthropathy.  Overall Joe [name] is pretty functional with his shoulder but is just having pain.  He is got forward flexion and abduction both above 90 degrees.  Some weakness is present but he can compensate well for it.  Discussed options with him today.  I think at some point he will require reverse replacement.  Tendon transfers not a great option at this time.  Shoulder injection performed and we will see him back in 6 weeks for clinical recheck.  Follow-Up Instructions: No follow-ups on file.   Orders:  No orders of the defined  types were placed in this encounter.  No orders of the defined types were placed in this encounter.     Procedures: Large Joint Inj: L glenohumeral on 04/13/2021 10:17 PM Indications: diagnostic evaluation and pain Details: 18 G 1.5 in needle, posterior approach  Arthrogram: No  Medications: 9 mL bupivacaine 0.5 %; 40 mg methylPREDNISolone acetate 40 MG/ML; 5 mL lidocaine 1 % Outcome: tolerated well, no immediate complications Procedure, treatment alternatives, risks and benefits explained, specific risks discussed. Consent was given by the patient. Immediately prior to procedure a time out was called to verify the correct patient, procedure, equipment, support staff and site/side marked as required. Patient was prepped and draped in the usual sterile fashion.      Clinical Data: No additional findings.  Objective: Vital Signs: There were no vitals taken for this visit.  Physical Exam:   Constitutional: Patient appears well-developed HEENT:  Head: Normocephalic myalgias, Eyes:EOM are normal Neck: Normal range of motion Cardiovascular: Normal rate Pulmonary/chest: Effort normal Neurologic: Patient is alert Skin: Skin is warm Psychiatric: Patient has normal mood and affect   Ortho Exam: Ortho exam demonstrates good strength infraspinatus testing on the left.  Subscap is 5- out of 5 weak on the left compared to the right.  Passive range of motion on the left is 50/95/170.  No coarse grinding or crepitus in that  left shoulder region with passive range of motion.  No discrete AC joint tenderness is present.  Specialty Comments:  No specialty comments available.  Imaging: No results found.   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   Hypertension Mother    Hypertension Father     Past Surgical History:  Procedure Laterality Date   COLONOSCOPY WITH PROPOFOL N/A 11/08/2018   Procedure: COLONOSCOPY WITH PROPOFOL;  Surgeon: Lavena Bullion, DO;  Location: [facility];  Service: Gastroenterology;  Laterality: N/A;   ESOPHAGOGASTRODUODENOSCOPY (EGD) WITH PROPOFOL N/A 11/06/2018   Procedure: ESOPHAGOGASTRODUODENOSCOPY (EGD) WITH PROPOFOL;  Surgeon: Carol Ada, MD;  Location: [facility];  Service: Endoscopy;  Laterality: N/A;   EUS  11/06/2018   Procedure: FULL UPPER ENDOSCOPIC ULTRASOUND (EUS) RADIAL;  Surgeon: Carol Ada, MD;  Location: [facility];  Service: Endoscopy;;   GIVENS CAPSULE STUDY N/A 11/08/2018   Procedure: GIVENS CAPSULE STUDY;  Surgeon: Lavena Bullion, DO;  Location: [facility];  Service: Gastroenterology;  Laterality: N/A;   IR ANGIOGRAM SELECTIVE EACH ADDITIONAL VESSEL  11/10/2018   IR ANGIOGRAM VISCERAL SELECTIVE  11/10/2018   IR ANGIOGRAM VISCERAL SELECTIVE  11/10/2018   IR EMBO ART  VEN HEMORR LYMPH EXTRAV  INC GUIDE ROADMAPPING  11/10/2018   IR US GUIDE VASC ACCESS RIGHT  11/10/2018   KNEE SURGERY Bilateral 1985   Arthroscopic for torn cartilage and ligaments.     SHOULDER OPEN ROTATOR CUFF REPAIR Left 05/10/2020   Procedure: LEFT SHOULDER OPEN ROTATOR CUFF REPAIR AND BICEPS TENODESIS;  Surgeon: Leandrew Koyanagi, MD;  Location: [facility];  Service:  Orthopedics;  Laterality: Left;   Social History   Occupational History   Occupation: Packing/stacking/loading    Comment: soaps, shaving implements, etc.  Tobacco Use   Smoking status: Never   Smokeless tobacco: Never  Vaping Use   Vaping Use: Never used  Substance and Sexual Activity   Alcohol use: Yes    Comment: 6 pack of beer daily   Drug use: No   Sexual activity: Yes       Ortho exam demonstrates

## 2021-04-23 ENCOUNTER — Ambulatory Visit: Payer: Medicaid Other | Attending: Internal Medicine | Admitting: Internal Medicine

## 2021-04-23 ENCOUNTER — Other Ambulatory Visit: Payer: Self-pay

## 2021-04-23 DIAGNOSIS — R7303 Prediabetes: Secondary | ICD-10-CM

## 2021-04-23 DIAGNOSIS — I10 Essential (primary) hypertension: Secondary | ICD-10-CM

## 2021-04-23 DIAGNOSIS — Z862 Personal history of diseases of the blood and blood-forming organs and certain disorders involving the immune mechanism: Secondary | ICD-10-CM

## 2021-04-23 NOTE — Progress Notes (Signed)
Virtual Visit via Telephone Note  I connected with Joe MUSSON Sr. on 04/23/2021 at 3:16 PM by telephone and verified that I am speaking with the correct person using two identifiers  Location: Patient: home Provider: office  Participants: Myself Patient    I discussed the limitations, risks, security and privacy concerns of performing an evaluation and management service by telephone and the availability of in person appointments. I also discussed with the patient that there may be a patient responsible charge related to this service. The patient expressed understanding and agreed to proceed.   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  Previous PCP was Dr. Chapman Fitch.    HYPERTENSION Currently taking: see medication list. On Cozaar.  Reports he got Rfs through UC and still has Rfs on current rxn  Med Adherence: [x]  Yes    []  No Medication side effects: []  Yes    [x]  No Adherence with salt restriction: [x]  Yes    []  No Home Monitoring?: [x]  Yes    []  No Monitoring Frequency: every other wk but does not recall the numbers.  However he tells me it has been lower than 130/80 Home BP results range: []  Yes    []  No SOB? []  Yes    [x]  No Chest Pain?: []  Yes    [x]  No Leg swelling?: []  Yes    [x]  No Headaches?: []  Yes    [x]  No Dizziness? []  Yes    [x]  No Comments:   PreDM:  out of Metformin for a mth.  Reports he does well with eating habits.  He avoids junk foods.  Drinks water, Kool-Aid (unsweetened), and regular sodas. Mows his yard once a wk for exercise  Anemia:  out of iron for a while. No dizziness or blood in stools He tells me that he did follow-up with the gastroenterologist after having an MRI of the abdomen back in June 2020.  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    Outpatient Encounter Medications as of 04/23/2021  Medication Sig   Blood Pressure Monitor KIT Used to monitor blood pressure daily and as directed   ferrous sulfate 325 (65 FE) MG tablet Take 1 tablet (325 mg total) by mouth daily with breakfast. Please make PCP appointment.   HYDROcodone-acetaminophen (NORCO) 5-325 MG tablet Take 1-2 tablets by mouth 2 (two) times daily as needed.   losartan (COZAAR) 50 MG tablet Take 1 tablet (50 mg total) by mouth daily. To lower blood pressure   meloxicam (MOBIC) 15 MG tablet Take 1 tablet (15 mg total) by mouth daily. Prn pain   metFORMIN (GLUCOPHAGE) 500 MG tablet 1 pill daily after the evening meal to help with blood sugar control   methocarbamol (ROBAXIN) 500 MG tablet Take 2 tablets (1,000 mg total) by mouth every 8 (eight) hours as needed for muscle spasms.   methocarbamol (ROBAXIN) 750 MG tablet Take 1 tablet (750 mg total) by mouth 2 (two) times daily as needed for muscle spasms.   ondansetron (ZOFRAN) 4 MG tablet Take 1-2 tablets (4-8 mg total) by mouth every 8 (eight) hours as needed for nausea or vomiting.   oxyCODONE-acetaminophen (PERCOCET) 5-325 MG tablet Take 1-2 tablets by mouth every 8 (eight) hours as needed for severe pain.   traMADol (ULTRAM) 50 MG tablet Take 1 tablet (50 mg total) by mouth 3 (  three) times daily as needed.   No facility-administered encounter medications on file as of 04/23/2021.      Observations/Objective: No direct observation done as this was a telephone encounter. Lab Results  Component Value Date   WBC 3.8 03/16/2020   HGB 12.8 (L) 03/16/2020   HCT 41.4 03/16/2020   MCV 75 (L) 03/16/2020   PLT 330 03/16/2020      Chemistry      Component Value Date/Time   NA 144 03/16/2020 0941   K 4.6 03/16/2020 0941   CL 108 (H) 03/16/2020 0941   CO2 18 (L) 03/16/2020 0941   BUN 8 03/16/2020 0941   CREATININE 0.84 03/16/2020 0941      Component Value Date/Time   CALCIUM 9.3 03/16/2020 0941    ALKPHOS 132 (H) 03/16/2020 0941   AST 194 (H) 03/16/2020 0941   ALT 106 (H) 03/16/2020 0941   BILITOT 1.5 (H) 03/16/2020 0941       Assessment and Plan: 1. Essential hypertension Continue Cozaar.  Follow-up with clinical pharmacist in 1 week for blood pressure check and to get his labs done. - CBC; Future - Comprehensive metabolic panel; Future - Lipid panel; Future  2. Prediabetes Discussed and encourage healthy eating habits.  Advised to eliminate sugary drinks from his diet.  Encouraged him to try to exercise more at least 3 to 5 days a week for 30 minutes. - Hemoglobin A1c; Future  3. History of anemia Recheck CBC to see whether he needs to be back on iron or not.   Follow Up Instructions: F/u with me in 2 mths Give appt with Clinical pharmacist next wk for BP check and with lab   I discussed the assessment and treatment plan with the patient. The patient was provided an opportunity to ask questions and all were answered. The patient agreed with the plan and demonstrated an understanding of the instructions.   The patient was advised to call back or seek an in-person evaluation if the symptoms worsen or if the condition fails to improve as anticipated.  I  Spent 12 minutes on this telephone encounter  Karle Plumber, MD

## 2021-04-30 ENCOUNTER — Other Ambulatory Visit: Payer: Medicaid Other

## 2021-05-01 ENCOUNTER — Ambulatory Visit: Payer: Medicaid Other | Attending: Internal Medicine | Admitting: Pharmacist

## 2021-05-01 ENCOUNTER — Other Ambulatory Visit: Payer: Self-pay

## 2021-05-01 DIAGNOSIS — I10 Essential (primary) hypertension: Secondary | ICD-10-CM | POA: Diagnosis not present

## 2021-05-01 DIAGNOSIS — R7303 Prediabetes: Secondary | ICD-10-CM | POA: Diagnosis not present

## 2021-05-01 NOTE — Progress Notes (Signed)
   S:    Patient arrives in good spirits on 05/01/21. Presents to the clinic for hypertension evaluation, counseling, and management. Patient was referred and last seen by Primary Care Provider on 04/23/21. At that visit, no medication changes were made but labs were sent in for next visit.   Today, patient is doing well. He reports medication adherence. Patient has reported no side effects with Cozaar and patient also denied any headache, dizziness, palpitations, or chest pain. Patient said that in regards to his diet, he drinks ginger ale, which is caffeine-free and a lot of water. Patient does not exercise on a regular basis. Patient did confirm that he does not check his blood pressure at home, but it is only checked when he has a doctor's visit. Patient did take medications this morning and only had boiled eggs for breakfast.   Current BP Medications include:  Losartan (cozaar) 50mg    Antihypertensives tried in the past include: HCTZ (discontinued d/t rash)   Family / Social history:  FH: Hypertension Educational psychologist) Tobacco: never smoker Alcohol: none   O:  Vitals:   05/01/21 0919  BP: 132/86  Pulse: (!) 56   Home BP readings: none  Last 3 Office BP readings: BP Readings from Last 3 Encounters:  05/01/21 132/86  05/10/20 (!) 152/101  03/16/20 (!) 155/94   BMET    Component Value Date/Time   NA 144 03/16/2020 0941   K 4.6 03/16/2020 0941   CL 108 (H) 03/16/2020 0941   CO2 18 (L) 03/16/2020 0941   GLUCOSE 102 (H) 03/16/2020 0941   GLUCOSE 95 10/16/2019 0858   BUN 8 03/16/2020 0941   CREATININE 0.84 03/16/2020 0941   CALCIUM 9.3 03/16/2020 0941   GFRNONAA 97 03/16/2020 0941   GFRAA 112 03/16/2020 0941   Renal function: CrCl cannot be calculated (Patient's most recent lab result is older than the maximum 21 days allowed.).  Clinical ASCVD: No  The ASCVD Risk score Mikey Bussing DC Jr., et al., 2013) failed to calculate for the following reasons:   The valid total cholesterol  range is 130 to 320 mg/dL  A/P: Hypertension longstanding currently above goal on current medications, however, patient is close to goal. BP Goal = < 130/80 mmHg. Medication adherence reported. BP control suboptimal due to dietary indiscretion. Discussed with patient the importance of healthy diet and exercise.  -No medication changes made to current therapy. -Continued Cozaar 50mg  once daily.  -F/u labs ordered - labs per PCP.  -Counseled on lifestyle modifications for blood pressure control including reduced dietary sodium, increased exercise, adequate sleep.  Results reviewed and written information provided. Total time in face-to-face counseling 30 minutes.   F/U Clinic Visit in 1 month.  Patient seen with:  Dorian Furnace PharmD/MBA Candidate [facility] [employer] [place], PharmD, [place], [place] [phone]

## 2021-05-02 ENCOUNTER — Telehealth: Payer: Self-pay | Admitting: Internal Medicine

## 2021-05-02 DIAGNOSIS — R7989 Other specified abnormal findings of blood chemistry: Secondary | ICD-10-CM

## 2021-05-02 DIAGNOSIS — R945 Abnormal results of liver function studies: Secondary | ICD-10-CM

## 2021-05-02 DIAGNOSIS — D649 Anemia, unspecified: Secondary | ICD-10-CM

## 2021-05-02 LAB — CBC
Hematocrit: 36.2 % — ABNORMAL LOW (ref 37.5–51.0)
Hemoglobin: 11.7 g/dL — ABNORMAL LOW (ref 13.0–17.7)
MCH: 27.7 pg (ref 26.6–33.0)
MCHC: 32.3 g/dL (ref 31.5–35.7)
MCV: 86 fL (ref 79–97)
Platelets: 343 10*3/uL (ref 150–450)
RBC: 4.22 x10E6/uL (ref 4.14–5.80)
RDW: 15.4 % (ref 11.6–15.4)
WBC: 3 10*3/uL — ABNORMAL LOW (ref 3.4–10.8)

## 2021-05-02 LAB — COMPREHENSIVE METABOLIC PANEL
ALT: 64 IU/L — ABNORMAL HIGH (ref 0–44)
AST: 60 IU/L — ABNORMAL HIGH (ref 0–40)
Albumin/Globulin Ratio: 1.6 (ref 1.2–2.2)
Albumin: 3.5 g/dL — ABNORMAL LOW (ref 3.8–4.9)
Alkaline Phosphatase: 127 IU/L — ABNORMAL HIGH (ref 44–121)
BUN/Creatinine Ratio: 16 (ref 9–20)
BUN: 14 mg/dL (ref 6–24)
Bilirubin Total: 0.9 mg/dL (ref 0.0–1.2)
CO2: 20 mmol/L (ref 20–29)
Calcium: 9 mg/dL (ref 8.7–10.2)
Chloride: 109 mmol/L — ABNORMAL HIGH (ref 96–106)
Creatinine, Ser: 0.9 mg/dL (ref 0.76–1.27)
Globulin, Total: 2.2 g/dL (ref 1.5–4.5)
Glucose: 88 mg/dL (ref 65–99)
Potassium: 4.5 mmol/L (ref 3.5–5.2)
Sodium: 142 mmol/L (ref 134–144)
Total Protein: 5.7 g/dL — ABNORMAL LOW (ref 6.0–8.5)
eGFR: 98 mL/min/{1.73_m2} (ref >59)

## 2021-05-02 LAB — LIPID PANEL
Chol/HDL Ratio: 1.9 ratio (ref 0.0–5.0)
Cholesterol, Total: 109 mg/dL (ref 100–199)
HDL: 58 mg/dL (ref >39)
LDL Chol Calc (NIH): 37 mg/dL (ref 0–99)
Triglycerides: 63 mg/dL (ref 0–149)
VLDL Cholesterol Cal: 14 mg/dL (ref 5–40)

## 2021-05-02 LAB — HEMOGLOBIN A1C
Est. average glucose Bld gHb Est-mCnc: 105 mg/dL
Hgb A1c MFr Bld: 5.3 % (ref 4.8–5.6)

## 2021-05-02 NOTE — Telephone Encounter (Signed)
Phone call placed to patient today to go over lab results.  Patient informed that he has a mild anemia that is a little worse compared to when blood test was done in May of last year.  I would like to check iron studies to see whether we need to get him back on iron supplement.  White blood cell count slightly low.  We will recheck this at her next visit. Liver function test shows mild elevation of AST ALT and alkaline phosphatase but decreased compared to when last checked in May 2021.  Patient reports that he was a heavy drinker but has cut back significantly.  Currently drinks 1-1.5 40 oz beer 2-3 times a wk. advised him to try to cut back to no more than  one40 ounce beer to last a week.  I would like to do additional blood tests to evaluate the abnormal liver function test.  Patient agreeable to coming to the lab this week. Kidney function tests good.  Cholesterol level normal.  Results for orders placed or performed in visit on 05/01/21  Hemoglobin A1c  Result Value Ref Range   Hgb A1c MFr Bld 5.3 4.8 - 5.6 %   Est. average glucose Bld gHb Est-mCnc 105 mg/dL  Lipid panel  Result Value Ref Range   Cholesterol, Total 109 100 - 199 mg/dL   Triglycerides 63 0 - 149 mg/dL   HDL 58 >39 mg/dL   VLDL Cholesterol Cal 14 5 - 40 mg/dL   LDL Chol Calc (NIH) 37 0 - 99 mg/dL   Chol/HDL Ratio 1.9 0.0 - 5.0 ratio  Comprehensive metabolic panel  Result Value Ref Range   Glucose 88 65 - 99 mg/dL   BUN 14 6 - 24 mg/dL   Creatinine, Ser 0.90 0.76 - 1.27 mg/dL   eGFR 98 >59 mL/min/1.73   BUN/Creatinine Ratio 16 9 - 20   Sodium 142 134 - 144 mmol/L   Potassium 4.5 3.5 - 5.2 mmol/L   Chloride 109 (H) 96 - 106 mmol/L   CO2 20 20 - 29 mmol/L   Calcium 9.0 8.7 - 10.2 mg/dL   Total Protein 5.7 (L) 6.0 - 8.5 g/dL   Albumin 3.5 (L) 3.8 - 4.9 g/dL   Globulin, Total 2.2 1.5 - 4.5 g/dL   Albumin/Globulin Ratio 1.6 1.2 - 2.2   Bilirubin Total 0.9 0.0 - 1.2 mg/dL   Alkaline Phosphatase 127 (H) 44 - 121 IU/L    AST 60 (H) 0 - 40 IU/L   ALT 64 (H) 0 - 44 IU/L  CBC  Result Value Ref Range   WBC 3.0 (L) 3.4 - 10.8 x10E3/uL   RBC 4.22 4.14 - 5.80 x10E6/uL   Hemoglobin 11.7 (L) 13.0 - 17.7 g/dL   Hematocrit 36.2 (L) 37.5 - 51.0 %   MCV 86 79 - 97 fL   MCH 27.7 26.6 - 33.0 pg   MCHC 32.3 31.5 - 35.7 g/dL   RDW 15.4 11.6 - 15.4 %   Platelets 343 150 - 450 x10E3/uL

## 2021-05-03 ENCOUNTER — Ambulatory Visit: Payer: Medicaid Other | Attending: Internal Medicine

## 2021-05-03 ENCOUNTER — Other Ambulatory Visit: Payer: Self-pay

## 2021-05-03 DIAGNOSIS — R7989 Other specified abnormal findings of blood chemistry: Secondary | ICD-10-CM

## 2021-05-03 DIAGNOSIS — R945 Abnormal results of liver function studies: Secondary | ICD-10-CM

## 2021-05-03 DIAGNOSIS — D649 Anemia, unspecified: Secondary | ICD-10-CM

## 2021-05-04 LAB — HEPATITIS C ANTIBODY: Hep C Virus Ab: <0.1 s/co ratio (ref 0.0–0.9)

## 2021-05-04 LAB — IRON,TIBC AND FERRITIN PANEL
Ferritin: 533 ng/mL — ABNORMAL HIGH (ref 30–400)
Iron Saturation: 36 % (ref 15–55)
Iron: 76 ug/dL (ref 38–169)
Total Iron Binding Capacity: 210 ug/dL — ABNORMAL LOW (ref 250–450)
UIBC: 134 ug/dL (ref 111–343)

## 2021-05-04 LAB — MITOCHONDRIAL ANTIBODIES: Mitochondrial Ab: <20 Units (ref 0.0–20.0)

## 2021-05-04 LAB — HEPATITIS B SURFACE ANTIBODY, QUANTITATIVE: Hepatitis B Surf Ab Quant: <3.1 m[IU]/mL — ABNORMAL LOW (ref >9.9)

## 2021-05-04 LAB — HEPATITIS B SURFACE ANTIGEN: Hepatitis B Surface Ag: NEGATIVE

## 2021-05-08 ENCOUNTER — Telehealth: Payer: Self-pay

## 2021-05-08 NOTE — Telephone Encounter (Signed)
Contacted pt to go over lab results pt didn't answer lvm   Sent a CRM and forward labs to NT to give pt labs when they call back   

## 2021-05-23 ENCOUNTER — Encounter: Payer: Self-pay | Admitting: Orthopedic Surgery

## 2021-05-23 ENCOUNTER — Ambulatory Visit (INDEPENDENT_AMBULATORY_CARE_PROVIDER_SITE_OTHER): Payer: Medicaid Other | Admitting: Orthopedic Surgery

## 2021-05-23 DIAGNOSIS — S46812A Strain of other muscles, fascia and tendons at shoulder and upper arm level, left arm, initial encounter: Secondary | ICD-10-CM

## 2021-05-23 NOTE — Progress Notes (Signed)
Office Visit Note   Patient: Joe WALN Sr.           Date of Birth: [birth year]           MRN: [id number] Visit Date: 05/23/2021 Requested by: No referring provider defined for this encounter. PCP: [name], MD  Subjective: Chief Complaint  Patient presents with   Left Shoulder - Pain    HPI: 60 year old patient with left shoulder pain.  He has irreparable rotator cuff pathology.  He is on light duty.  Injection gave him pretty good relief for about 4 weeks.  Pain started again but not quite as bad.  He is does have functional range of motion but no strength overhead.              ROS: All systems reviewed are negative as they relate to the chief complaint within the history of present illness.  Patient denies  fevers or chills.   Assessment & Plan: Visit Diagnoses:  1. 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  Plan is observation for now.  Too young for reverse replacement.  Does have functional range of motion.  Episodic injections seems to be the way to go at this time.  If he does get a reverse he would have to change his lifting.  I am going to Keep him with no overhead lifting for a year at work.  Follow-up as needed.  Follow-Up Instructions: Return if symptoms worsen or fail to improve.   Orders:  No orders of the defined types were placed in this encounter.  No orders of the defined types were placed in this encounter.     Procedures: No procedures performed   Clinical Data: No additional findings.  Objective: Vital Signs: There were no vitals taken for this visit.  Physical Exam:   Constitutional: Patient appears well-developed HEENT:  Head: Normocephalic Eyes:EOM are normal Neck: Normal range of motion Cardiovascular: Normal rate Pulmonary/chest: Effort normal Neurologic: Patient is alert Skin: Skin  is warm Psychiatric: Patient has normal mood and affect   Ortho Exam: Ortho exam demonstrates active range of motion at 50/95/170 bilaterally.  Does have weakness to supraspinatus and subscap testing on the left compared to the right.  Shoulder is located.  Motor sensory function to the hand is intact.  No muscle atrophy left arm.  Specialty Comments:  No specialty comments available.  Imaging: No results found.   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   Hypertension Mother    Hypertension Father     Past Surgical History:  Procedure Laterality Date   COLONOSCOPY WITH PROPOFOL N/A 11/08/2018   Procedure: COLONOSCOPY WITH PROPOFOL;  Surgeon: Lavena Bullion, DO;  Location: [place];  Service: Gastroenterology;  Laterality: N/A;   ESOPHAGOGASTRODUODENOSCOPY (EGD) WITH PROPOFOL N/A 11/06/2018   Procedure: ESOPHAGOGASTRODUODENOSCOPY (EGD) WITH PROPOFOL;  Surgeon: Carol Ada, MD;  Location: [place];  Service: Endoscopy;  Laterality: N/A;   EUS  11/06/2018   Procedure: FULL UPPER ENDOSCOPIC ULTRASOUND (EUS) RADIAL;  Surgeon: Carol Ada, MD;  Location: [place];  Service: Endoscopy;;   GIVENS  CAPSULE STUDY N/A 11/08/2018   Procedure: GIVENS CAPSULE STUDY;  Surgeon: Lavena Bullion, DO;  Location: [place];  Service: Gastroenterology;  Laterality: N/A;   IR ANGIOGRAM SELECTIVE EACH ADDITIONAL VESSEL  11/10/2018   IR ANGIOGRAM VISCERAL SELECTIVE  11/10/2018   IR ANGIOGRAM VISCERAL SELECTIVE  11/10/2018   IR EMBO ART  VEN HEMORR LYMPH EXTRAV  INC GUIDE ROADMAPPING  11/10/2018   IR US GUIDE VASC ACCESS RIGHT  11/10/2018   KNEE SURGERY Bilateral 1985   Arthroscopic for torn cartilage and ligaments.     SHOULDER OPEN ROTATOR CUFF REPAIR Left 05/10/2020   Procedure: LEFT SHOULDER OPEN ROTATOR CUFF REPAIR AND BICEPS TENODESIS;  Surgeon: Leandrew Koyanagi, MD;  Location: [place];  Service: Orthopedics;  Laterality: Left;   Social History   Occupational History   Occupation: Packing/stacking/loading    Comment: soaps, shaving implements, etc.  Tobacco Use   Smoking status: Never   Smokeless tobacco: Never  Vaping Use   Vaping Use: Never used  Substance and Sexual Activity   Alcohol use: Yes    Comment: 6 pack of beer daily   Drug use: No   Sexual activity: Yes

## 2021-07-19 ENCOUNTER — Encounter (INDEPENDENT_AMBULATORY_CARE_PROVIDER_SITE_OTHER): Payer: Self-pay

## 2021-07-19 ENCOUNTER — Other Ambulatory Visit: Payer: Self-pay

## 2021-07-19 ENCOUNTER — Ambulatory Visit: Payer: Medicaid Other | Attending: Internal Medicine | Admitting: Internal Medicine

## 2021-07-19 ENCOUNTER — Encounter: Payer: Self-pay | Admitting: Internal Medicine

## 2021-07-19 VITALS — BP 150/90 | HR 73 | Resp 16 | Wt 153.2 lb

## 2021-07-19 DIAGNOSIS — I10 Essential (primary) hypertension: Secondary | ICD-10-CM | POA: Diagnosis present

## 2021-07-19 DIAGNOSIS — R945 Abnormal results of liver function studies: Secondary | ICD-10-CM

## 2021-07-19 DIAGNOSIS — K1370 Unspecified lesions of oral mucosa: Secondary | ICD-10-CM | POA: Insufficient documentation

## 2021-07-19 DIAGNOSIS — R634 Abnormal weight loss: Secondary | ICD-10-CM | POA: Diagnosis not present

## 2021-07-19 DIAGNOSIS — D649 Anemia, unspecified: Secondary | ICD-10-CM | POA: Diagnosis not present

## 2021-07-19 DIAGNOSIS — K529 Noninfective gastroenteritis and colitis, unspecified: Secondary | ICD-10-CM | POA: Diagnosis not present

## 2021-07-19 DIAGNOSIS — R7303 Prediabetes: Secondary | ICD-10-CM | POA: Insufficient documentation

## 2021-07-19 DIAGNOSIS — R7989 Other specified abnormal findings of blood chemistry: Secondary | ICD-10-CM | POA: Insufficient documentation

## 2021-07-19 DIAGNOSIS — Z888 Allergy status to other drugs, medicaments and biological substances status: Secondary | ICD-10-CM | POA: Diagnosis not present

## 2021-07-19 DIAGNOSIS — Z79899 Other long term (current) drug therapy: Secondary | ICD-10-CM | POA: Diagnosis not present

## 2021-07-19 DIAGNOSIS — Z8249 Family history of ischemic heart disease and other diseases of the circulatory system: Secondary | ICD-10-CM | POA: Diagnosis not present

## 2021-07-19 DIAGNOSIS — Z2821 Immunization not carried out because of patient refusal: Secondary | ICD-10-CM

## 2021-07-19 NOTE — Progress Notes (Addendum)
Patient ID: Joe Loan Sr., male    DOB: 1961/10/22  MRN: [id number]  CC: Hypertension   Subjective: Joe [name] is a 60 y.o. male who presents for chronic ds management. 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    Abn LFT: Patient had elevation of AST, ALT and alkaline phosphatase on last visit that had  improved from previous.  He had told me that at one point he was drinking excessively but had cut back.  Today he reports that he has cut back even more.  Currently drinks a sixpack of beer per week.  HYPERTENSION Currently taking: see medication list.  Currently on Cozaar 50 mg daily. Med Adherence: [x]  Yes but has not taken as yet for the morning    []  No Medication side effects: []  Yes    [x]  No Adherence with salt restriction: [x]  Yes    []  No Home Monitoring?: []  Yes    [x]  No but does have a device Monitoring Frequency:  Home BP results range:  SOB? [x]  Yes - sometimes when climbing steps    []  No Chest Pain?: []  Yes    [x]  No Leg swelling?: []  Yes    [x]  No Headaches?: []  Yes    [x]  No Dizziness? []  Yes    [x]  No Comments:   Wgh done 9 lbs in past yr.  Has diarrhea that comes and goes x 6 mths. Not on Metformin.  Does not assoc it with any particular types of foods.   Denies nausea/vomiting and dysphagia.  Reports good appetite.  Up-to-date with colon cancer screening. No night sweats. Denies any blood in the stools.  No hematuria. No chronic cough.  He is a non-smoker.  Anemia: Noted to have a mild anemia on CBC with H/H of 11.7/36 and normal MCV.  Looks like anemia of chronic disease on iron studies. No dizziness or fatigue  No longer preDM based on last A1C  HM: declines flu and Pneumonia vaccines today.  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 To lower blood pressure 90 tablet 0   Blood Pressure Monitor KIT Used to monitor blood pressure daily and as directed (Patient not taking: Reported on 07/19/2021) 1 each 0   No current facility-administered medications on file prior to visit.    Allergies  Allergen Reactions   Hctz [Hydrochlorothiazide] Rash    Social History   Socioeconomic History   Marital status: Soil scientist    Spouse name: [name]   Number of children: 2   Years of education: 12   Highest education level: 12th grade  Occupational History   Occupation: Packing/stacking/loading    Comment: soaps, shaving implements, etc.  Tobacco Use   Smoking status: Never   Smokeless tobacco: Never  Vaping Use   Vaping  Use: Never used  Substance and Sexual Activity   Alcohol use: Yes    Comment: 6 pack of beer daily   Drug use: No   Sexual activity: Yes  Other Topics Concern   Not on file  Social History Narrative   ** Merged History Encounter **       Works second shift at [employer] and [employer] with his partner of 30+ years and 39 yo daughter, Joe [name].     Social Determinants of Health   Financial Resource Strain: Not on file  Food Insecurity: Not on file  Transportation Needs: Not on file  Physical Activity: Not on file  Stress: Not on file  Social Connections: Not on file  Intimate Partner Violence: Not on file    Family History  Problem Relation Age of Onset   Cancer Mother         uncertain what was wrong--mother would not share, but gradual decline.   Hypertension Mother    Hypertension Father     Past Surgical History:  Procedure Laterality Date   COLONOSCOPY WITH PROPOFOL N/A 11/08/2018   Procedure: COLONOSCOPY WITH PROPOFOL;  Surgeon: Lavena Bullion, DO;  Location: [place];  Service: Gastroenterology;  Laterality: N/A;   ESOPHAGOGASTRODUODENOSCOPY (EGD) WITH PROPOFOL N/A 11/06/2018   Procedure: ESOPHAGOGASTRODUODENOSCOPY (EGD) WITH PROPOFOL;  Surgeon: Carol Ada, MD;  Location: [place];  Service: Endoscopy;  Laterality: N/A;   EUS  11/06/2018   Procedure: FULL UPPER ENDOSCOPIC ULTRASOUND (EUS) RADIAL;  Surgeon: Carol Ada, MD;  Location: [place];  Service: Endoscopy;;   GIVENS CAPSULE STUDY N/A 11/08/2018   Procedure: GIVENS CAPSULE STUDY;  Surgeon: Lavena Bullion, DO;  Location: [place];  Service: Gastroenterology;  Laterality: N/A;   IR ANGIOGRAM SELECTIVE EACH ADDITIONAL VESSEL  11/10/2018   IR ANGIOGRAM VISCERAL SELECTIVE  11/10/2018   IR ANGIOGRAM VISCERAL SELECTIVE  11/10/2018   IR EMBO ART  VEN HEMORR LYMPH EXTRAV  INC GUIDE ROADMAPPING  11/10/2018   IR US GUIDE VASC ACCESS RIGHT  11/10/2018   KNEE SURGERY Bilateral 1985   Arthroscopic for torn cartilage and ligaments.     SHOULDER OPEN ROTATOR CUFF REPAIR Left 05/10/2020   Procedure: LEFT SHOULDER OPEN ROTATOR CUFF REPAIR AND BICEPS TENODESIS;  Surgeon: Leandrew Koyanagi, MD;  Location: [place] [place];  Service: Orthopedics;  Laterality: Left;    ROS: Review of Systems Negative except as stated above  PHYSICAL EXAM: BP (!) 150/90   Pulse 73   Resp 16   Wt 153 lb 3.2 oz (69.5 kg)   SpO2 99%   BMI 21.67 kg/m   Wt Readings from Last 3 Encounters:  07/19/21 153 lb 3.2 oz (69.5 kg)  02/27/21 162 lb (73.5 kg)  08/04/20 162 lb (73.5 kg)    Physical Exam  General appearance - alert, well appearing, and in no distress Mental status - normal mood, behavior, speech, dress,  motor activity, and thought processes Neck - supple, no significant adenopathy.  No thyroid lodgment. Mouth:: Patient has a irregular bony overgrowth below the tongue.  He tells me he has had it for years. Nose: Dry nasal mucosa.  No enlargement of the nasal turbinates Chest - clear to auscultation, no wheezes, rales or rhonchi, symmetric air entry Heart - normal rate, regular rhythm, normal S1, S2, no murmurs, rubs, clicks or gallops Abdomen: Normal bowel sounds, soft, nontender, no organomegaly or masses palpated. Extremities - peripheral pulses normal, no pedal edema, no clubbing or cyanosis  Depression screen  [facility] 2/9 07/19/2021 03/16/2020 10/22/2019  Decreased Interest 0 0 0  Down, Depressed, Hopeless 0 0 0  PHQ - 2 Score 0 0 0  Altered sleeping - 0 -  Tired, decreased energy - 0 -  Change in appetite - 0 -  Feeling bad or failure about yourself  - 0 -  Trouble concentrating - 0 -  Moving slowly or fidgety/restless - 0 -  Suicidal thoughts - 0 -  PHQ-9 Score - 0 -  Difficult doing work/chores - - -    CMP Latest Ref Rng & Units 05/01/2021 03/16/2020 10/16/2019  Glucose 65 - 99 mg/dL 88 102(H) 95  BUN 6 - 24 mg/dL 14 8 10   Creatinine 0.76 - 1.27 mg/dL 0.90 0.84 0.83  Sodium 134 - 144 mmol/L 142 144 138  Potassium 3.5 - 5.2 mmol/L 4.5 4.6 4.1  Chloride 96 - 106 mmol/L 109(H) 108(H) 109  CO2 20 - 29 mmol/L 20 18(L) 22  Calcium 8.7 - 10.2 mg/dL 9.0 9.3 8.5(L)  Total Protein 6.0 - 8.5 g/dL 5.7(L) 7.2 5.8(L)  Total Bilirubin 0.0 - 1.2 mg/dL 0.9 1.5(H) 0.8  Alkaline Phos 44 - 121 IU/L 127(H) 132(H) 72  AST 0 - 40 IU/L 60(H) 194(H) 21  ALT 0 - 44 IU/L 64(H) 106(H) 19   Lipid Panel     Component Value Date/Time   CHOL 109 05/01/2021 0921   TRIG 63 05/01/2021 0921   HDL 58 05/01/2021 0921   CHOLHDL 1.9 05/01/2021 0921   LDLCALC 37 05/01/2021 0921    CBC    Component Value Date/Time   WBC 3.0 (L) 05/01/2021 0921   WBC 2.7 (L) 10/16/2019 0858   RBC 4.22 05/01/2021 0921    RBC 3.52 (L) 10/16/2019 0931   RBC 3.35 (L) 10/16/2019 0858   HGB 11.7 (L) 05/01/2021 0921   HCT 36.2 (L) 05/01/2021 0921   PLT 343 05/01/2021 0921   MCV 86 05/01/2021 0921   MCH 27.7 05/01/2021 0921   MCH 16.4 (L) 10/16/2019 0858   MCHC 32.3 05/01/2021 0921   MCHC 26.1 (L) 10/16/2019 0858   RDW 15.4 05/01/2021 0921   LYMPHSABS 0.9 03/16/2020 0941   MONOABS 0.3 11/05/2018 1024   EOSABS 0.1 03/16/2020 0941   BASOSABS 0.0 03/16/2020 0941    ASSESSMENT AND PLAN: 1. Essential hypertension Not at goal but he has not taken his medication as yet for today. Continue Cozaar and low-salt diet.  Patient to take medication when he gets home.  2. Abnormal LFTs Commended him on cutting back on the amount that he drinks.  We will recheck LFTs today. - Hepatic Function Panel  3. Normocytic anemia - CBC  4. Chronic diarrhea - Ambulatory referral to Gastroenterology  5. Unexplained weight loss Declines HIV - TSH+T4F+T3Free  6. Mouth lesion Recommends that he sees a dentist about this.  They look like bony overgrowth.  7. Influenza vaccination declined Recommended.  Patient declined.  8. 23-polyvalent pneumococcal polysaccharide vaccine declined Recommended.  Patient declined.   Patient was given the opportunity to ask questions.  Patient verbalized understanding of the plan and was able to repeat key elements of the plan.   Addendum 07/21/21:  LFT still abnormal.  Will refer to GI for this also and order RUQ [place].  Orders Placed This Encounter  Procedures   Hepatic Function Panel   CBC   TSH+T4F+T3Free   Ambulatory referral to Gastroenterology     Requested Prescriptions    No prescriptions requested or ordered in  this encounter    Return in about 4 months (around 11/18/2021).  Karle Plumber, MD, FACP

## 2021-07-20 LAB — HEPATIC FUNCTION PANEL
ALT: 61 IU/L — ABNORMAL HIGH (ref 0–44)
AST: 72 IU/L — ABNORMAL HIGH (ref 0–40)
Albumin: 3.5 g/dL — ABNORMAL LOW (ref 3.8–4.9)
Alkaline Phosphatase: 129 IU/L — ABNORMAL HIGH (ref 44–121)
Bilirubin Total: 1.6 mg/dL — ABNORMAL HIGH (ref 0.0–1.2)
Bilirubin, Direct: 0.53 mg/dL — ABNORMAL HIGH (ref 0.00–0.40)
Total Protein: 5.8 g/dL — ABNORMAL LOW (ref 6.0–8.5)

## 2021-07-20 LAB — CBC
Hematocrit: 33.8 % — ABNORMAL LOW (ref 37.5–51.0)
Hemoglobin: 11.3 g/dL — ABNORMAL LOW (ref 13.0–17.7)
MCH: 27 pg (ref 26.6–33.0)
MCHC: 33.4 g/dL (ref 31.5–35.7)
MCV: 81 fL (ref 79–97)
Platelets: 265 10*3/uL (ref 150–450)
RBC: 4.19 x10E6/uL (ref 4.14–5.80)
RDW: 13.2 % (ref 11.6–15.4)
WBC: 2.6 10*3/uL — ABNORMAL LOW (ref 3.4–10.8)

## 2021-07-20 LAB — TSH+T4F+T3FREE
Free T4: 1.3 ng/dL (ref 0.82–1.77)
T3, Free: 3.1 pg/mL (ref 2.0–4.4)
TSH: 1.1 u[IU]/mL (ref 0.450–4.500)

## 2021-07-21 NOTE — Addendum Note (Signed)
Addended by: Karle Plumber B on: 07/21/2021 07:25 PM   Modules accepted: Orders

## 2021-07-21 NOTE — Progress Notes (Signed)
Liver function blood test still abnormal. Abdominal US ordered.  Please schedule for him. Will refer to GI for this also in addition for the chronic diarrhea.  Thyroid level normal.  Still has mild anemia and mildly low white blood cell count.

## 2021-07-23 ENCOUNTER — Telehealth: Payer: Self-pay

## 2021-07-23 NOTE — Telephone Encounter (Signed)
Contacted pt to go over lab results pt didn't answer lvm asking pt to give me a call at her/his earliest convenience The appointment for the Abdominal US was made and a message was left.  Sent a CRM and forward labs to NT to give pt labs when they call back

## 2021-07-31 ENCOUNTER — Ambulatory Visit (HOSPITAL_COMMUNITY)
Admission: RE | Admit: 2021-07-31 | Discharge: 2021-07-31 | Disposition: A | Discharge status: Home / Self Care | Payer: Medicaid Other | Source: Ambulatory Visit | Attending: Internal Medicine | Admitting: Internal Medicine

## 2021-07-31 ENCOUNTER — Other Ambulatory Visit: Payer: Self-pay

## 2021-07-31 DIAGNOSIS — R7989 Other specified abnormal findings of blood chemistry: Secondary | ICD-10-CM

## 2021-07-31 DIAGNOSIS — R945 Abnormal results of liver function studies: Secondary | ICD-10-CM | POA: Diagnosis present

## 2021-08-02 ENCOUNTER — Telehealth: Payer: Self-pay

## 2021-08-02 NOTE — Telephone Encounter (Signed)
[facility] interpreters   Id#   contacted pt to go over lab results

## 2021-11-04 DIAGNOSIS — A048 Other specified bacterial intestinal infections: Secondary | ICD-10-CM

## 2021-11-04 HISTORY — DX: Other specified bacterial intestinal infections: A04.8

## 2021-11-19 ENCOUNTER — Ambulatory Visit: Payer: Medicaid Other | Attending: Internal Medicine | Admitting: Internal Medicine

## 2021-11-19 ENCOUNTER — Other Ambulatory Visit: Payer: Self-pay

## 2021-11-19 ENCOUNTER — Encounter: Payer: Self-pay | Admitting: Internal Medicine

## 2021-11-19 DIAGNOSIS — K760 Fatty (change of) liver, not elsewhere classified: Secondary | ICD-10-CM | POA: Diagnosis not present

## 2021-11-19 DIAGNOSIS — R7989 Other specified abnormal findings of blood chemistry: Secondary | ICD-10-CM | POA: Diagnosis not present

## 2021-11-19 DIAGNOSIS — I10 Essential (primary) hypertension: Secondary | ICD-10-CM

## 2021-11-19 DIAGNOSIS — D649 Anemia, unspecified: Secondary | ICD-10-CM

## 2021-11-19 MED ORDER — LOSARTAN POTASSIUM 50 MG PO TABS: 50.0000 mg | ORAL_TABLET | Freq: Every day | ORAL | 1 refills | Status: DC

## 2021-11-19 NOTE — Progress Notes (Signed)
Patient ID: Joe Loan Sr., male   DOB: 1961/03/20, 61 y.o.   MRN: [id number] Virtual Visit via Telephone Note  I connected with Joe Durand Alligood Sr. on 11/19/2021 at 8:37 AM by telephone and verified that I am speaking with the correct person using two identifiers  Location: Patient: at work Provider: office  Participants: Myself Patient    I discussed the limitations, risks, security and privacy concerns of performing an evaluation and management service by telephone and the availability of in person appointments. I also discussed with the patient that there may be a patient responsible charge related to this service. The patient expressed understanding and agreed to proceed.   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  Last seen 07/2021  HYPERTENSION Currently taking: see medication list Med Adherence: [x]  Yes -Cozaar 50 mg   []  No Medication side effects: []  Yes    [x]  No Adherence with salt restriction: [x]  Yes    []  No Home Monitoring?: [x]  Yes    []  No Monitoring Frequency: once a wk Home BP results range: does not recall his readings and currently not at home SOB? []  Yes    [x]  No Chest Pain?: []  Yes    [x]  No Leg swelling?: []  Yes    [x]  No Headaches?: []  Yes    [x]  No Dizziness? []  Yes    [x]  No Comments:   Abn LFT: LFTs were repeated on last visit.  AST and ALT were still mildly elevated at 72/61.  Right upper quadrant ultrasound revealed diffuse hepatic steatosis with dilated common bile duct but without significant change compared to prior study.  Patient was referred to the gastroenterologist. He was called by GI but was not able to call back citing his work schedule -reports in 1 wk he drinks about two 22 oz beer  Anemia: Last CBC revealed mild anemia that was slightly worse with H/H 11.3/33.8 and low  WBC of 2.6.  He denies any dizziness.  Outpatient Encounter Medications as of 11/19/2021  Medication Sig   Blood Pressure Monitor KIT Used to monitor blood pressure daily and as directed (Patient not taking: Reported on 07/19/2021)   losartan (COZAAR) 50 MG tablet Take 1 tablet (50 mg total) by mouth daily. To lower blood pressure   No facility-administered encounter medications on file as of 11/19/2021.      Observations/Objective: No direct observation done as this was a telephone encounter. Results for orders placed or performed in visit on 07/19/21  Hepatic Function Panel  Result Value Ref Range   Total Protein 5.8 (L) 6.0 - 8.5 g/dL   Albumin 3.5 (L) 3.8 - 4.9 g/dL   Bilirubin Total 1.6 (H) 0.0 - 1.2 mg/dL   Bilirubin, Direct 0.53 (H) 0.00 - 0.40 mg/dL   Alkaline Phosphatase 129 (H) 44 - 121 IU/L   AST 72 (H) 0 - 40 IU/L   ALT 61 (H) 0 - 44 IU/L  CBC  Result Value Ref Range   WBC 2.6 (L) 3.4 - 10.8 x10E3/uL   RBC 4.19 4.14 - 5.80 x10E6/uL   Hemoglobin 11.3 (L) 13.0 - 17.7 g/dL   Hematocrit 33.8 (L) 37.5 - 51.0 %   MCV 81 79 - 97 fL   MCH 27.0 26.6 - 33.0 pg   MCHC 33.4 31.5 - 35.7 g/dL   RDW 13.2 11.6 - 15.4 %  Platelets 265 150 - 450 x10E3/uL  TSH+T4F+T3Free  Result Value Ref Range   TSH 1.100 0.450 - 4.500 uIU/mL   T3, Free 3.1 2.0 - 4.4 pg/mL   Free T4 1.30 0.82 - 1.77 ng/dL     Assessment and Plan: 1. Essential hypertension Level of control unknown.  I will have him scheduled with the clinical pharmacist in about 2 weeks for blood pressure check. - losartan (COZAAR) 50 MG tablet; Take 1 tablet (50 mg total) by mouth daily.  Dispense: 90 tablet; Refill: 1  2. Abnormal LFTs 3. Hepatic steatosis -Commended him on cutting back on alcohol use.  Advised no more than two 12 ounce beers in a day but preferably he should quit completely.  I will recheck his liver function test.  If still elevated, he is agreeable for me to resubmit the referral to the  gastroenterologist.  However I told him that if we have to do that, he would need to return their call when they called him to schedule the appointment. - Hepatic Function Panel; Future  4. Normocytic anemia - CBC With Differential; Future - Iron, TIBC and Ferritin Panel; Future   Follow Up Instructions: Follow-up with clinical pharmacist in 2 to 4 weeks for blood pressure check. Follow-up with me in 4 months.   I discussed the assessment and treatment plan with the patient. The patient was provided an opportunity to ask questions and all were answered. The patient agreed with the plan and demonstrated an understanding of the instructions.   The patient was advised to call back or seek an in-person evaluation if the symptoms worsen or if the condition fails to improve as anticipated.  I  Spent 13 minutes on this telephone encounter  Karle Plumber, MD

## 2021-12-18 ENCOUNTER — Ambulatory Visit: Payer: Medicaid Other | Attending: Internal Medicine | Admitting: Pharmacist

## 2021-12-18 ENCOUNTER — Encounter: Payer: Self-pay | Admitting: Pharmacist

## 2021-12-18 VITALS — BP 168/89 | HR 68

## 2021-12-18 DIAGNOSIS — Z79899 Other long term (current) drug therapy: Secondary | ICD-10-CM | POA: Insufficient documentation

## 2021-12-18 DIAGNOSIS — Z9114 Patient's other noncompliance with medication regimen: Secondary | ICD-10-CM | POA: Insufficient documentation

## 2021-12-18 DIAGNOSIS — Z8249 Family history of ischemic heart disease and other diseases of the circulatory system: Secondary | ICD-10-CM | POA: Diagnosis not present

## 2021-12-18 DIAGNOSIS — I10 Essential (primary) hypertension: Secondary | ICD-10-CM | POA: Diagnosis present

## 2021-12-18 NOTE — Progress Notes (Signed)
° °  S:    PCP: Dr. Wynetta Emery   Patient arrives in good spirits. Presents to the clinic for hypertension evaluation, counseling, and management. Patient was referred and last seen by Primary Care Provider on 11/19/2021. That visit occurred via video, therefore, he was instructed to come in today for a BP check. His last BP in clinic was in September of 2022 and was high at 150/90.   Today, patient is doing okay. He has not been taking his losartan consistently and has some family issues he is managing. He endorses some dizziness and is concerned that his blood pressure has been elevated.  Denies any headache, palpitations, or chest pain. No LE edema.   Current BP Medications include:  Losartan (cozaar) 50mg    Antihypertensives tried in the past include: HCTZ (discontinued d/t rash)   Family / Social history:  FH: Hypertension Educational psychologist) Tobacco: never smoker Alcohol: none   Reported dietary habits:  -Patient said that in regards to his diet, he drinks ginger ale and water.   Reported exercise habits:  - Patient does some resistance training ~3 days weekly. Walks daily. No specific amount of time spent exercising given.   Reported home blood pressure readings:  - Tells me his meter at home is not working.   O:  Vitals:   12/18/21 0921  BP: (!) 168/89  Pulse: 68   Last 3 Office BP readings: BP Readings from Last 3 Encounters:  12/18/21 (!) 168/89  07/19/21 (!) 150/90  05/01/21 132/86   BMET    Component Value Date/Time   NA 142 05/01/2021 0921   K 4.5 05/01/2021 0921   CL 109 (H) 05/01/2021 0921   CO2 20 05/01/2021 0921   GLUCOSE 88 05/01/2021 0921   GLUCOSE 95 10/16/2019 0858   BUN 14 05/01/2021 0921   CREATININE 0.90 05/01/2021 0921   CALCIUM 9.0 05/01/2021 0921   GFRNONAA 97 03/16/2020 0941   GFRAA 112 03/16/2020 0941   Renal function: CrCl cannot be calculated (Patient's most recent lab result is older than the maximum 21 days allowed.).  Clinical ASCVD: No   The ASCVD Risk score (Arnett DK, et al., 2019) failed to calculate for the following reasons:   The valid total cholesterol range is 130 to 320 mg/dL  A/P: Hypertension longstanding above goal on current medications. BP Goal = < 130/80 mmHg. Medication adherence is suboptimal. He plains to do a better job with adherence and agrees to return within 1 month for a recheck. -No medication changes made to current therapy. Return in 1 month for BP recheck after medication adherence improves.  -Continued Cozaar 50mg  once daily.  -Counseled on lifestyle modifications for blood pressure control including reduced dietary sodium, increased exercise, adequate sleep.  Results reviewed and written information provided. Total time in face-to-face counseling 15 minutes.   F/U Clinic Visit in 1 month.    Benard Halsted, PharmD, [facility] [place], [place] [phone]

## 2021-12-24 ENCOUNTER — Telehealth: Payer: Self-pay | Admitting: Internal Medicine

## 2021-12-24 NOTE — Telephone Encounter (Signed)
Pt called back, requested to speak with [facility] as well, please advise.

## 2021-12-24 NOTE — Telephone Encounter (Signed)
Pt wife has called back/ pt now wants to be called at [phone] Cell # or  Return call  [phone] in the mornings or until 2:00

## 2021-12-24 NOTE — Telephone Encounter (Signed)
Patient checking on the status of Dr. Notes excusing him from work on 2/6 and 2/8 and returning back to work on 12/20/2021. Patient states time is sensitive and would like Dr. Note as soon as possible.    Copied from [place] [phone]. Topic: General - Call Back - No Documentation >> Dec 21, 2021  4:50 PM Erick Blinks wrote: Reason for CRM: Pt called to report that he needs an updated note for work. The current note has the wrong date, he needs his note to cover the 12/10/2021 - 12/15/2021.   Best contact: >> Dec 21, 2021  5:24 PM Greggory Keen D wrote: Pt called back saying to change the date to 12/10/21 for his work note

## 2021-12-25 NOTE — Telephone Encounter (Signed)
Updated note provided. Call placed to patient's wife with no answer. I left a HIPAA-compliant message informing her that this letter will be left for her to pick-up from our front desk.

## 2022-01-16 ENCOUNTER — Ambulatory Visit: Payer: Medicaid Other | Admitting: Pharmacist

## 2022-01-17 ENCOUNTER — Ambulatory Visit: Payer: Medicaid Other | Attending: Internal Medicine | Admitting: Pharmacist

## 2022-01-17 ENCOUNTER — Encounter: Payer: Self-pay | Admitting: Pharmacist

## 2022-01-17 ENCOUNTER — Other Ambulatory Visit: Payer: Self-pay

## 2022-01-17 VITALS — BP 152/96

## 2022-01-17 DIAGNOSIS — Z0000 Encounter for general adult medical examination without abnormal findings: Secondary | ICD-10-CM | POA: Diagnosis present

## 2022-01-17 DIAGNOSIS — I10 Essential (primary) hypertension: Secondary | ICD-10-CM | POA: Insufficient documentation

## 2022-01-17 DIAGNOSIS — Z79899 Other long term (current) drug therapy: Secondary | ICD-10-CM | POA: Diagnosis not present

## 2022-01-17 MED ORDER — AMLODIPINE BESYLATE 5 MG PO TABS
5.0000 mg | ORAL_TABLET | Freq: Every day | ORAL | 1 refills | Status: DC
Start: 2022-01-17 — End: 2022-03-19

## 2022-01-17 MED ORDER — LOSARTAN POTASSIUM 50 MG PO TABS: 50.0000 mg | ORAL_TABLET | Freq: Every day | ORAL | 1 refills | Status: DC

## 2022-01-17 NOTE — Progress Notes (Signed)
? ?  S:    ?PCP: Dr. Wynetta Emery  ? ?Patient arrives in good spirits. Presents to the clinic for hypertension evaluation, counseling, and management. Patient was referred and last seen by Primary Care Provider on 11/19/2021. I saw him on 12/18/2021 and his BP was 168/89 mmHg. However, he admitted to partial medication compliance. No changes were made.  ? ?Today, patient is doing well. He has been taking his losartan consistently since seeing me and is feeling better overall. He has taken his losartan today. Denies any headache, palpitations, dizziness, or chest pain. No LE edema.  ? ?Current BP Medications include:  Losartan '50mg'$   ? ?Antihypertensives tried in the past include: HCTZ (discontinued d/t rash)  ? ?Family / Social history:  ?FH: Hypertension Educational psychologist) ?Tobacco: never smoker ?Alcohol: none  ? ?Reported dietary habits:  ?-Patient said that in regards to his diet, he drinks ginger ale and water.  ?-Patient drinks V8 juice ?-Admits to eating increased sodium amounts last week but has done better this week. ? ?Reported exercise habits:  ?- Patient does some resistance training ~3 days weekly. Walks daily. No specific amount of time spent exercising given.  ? ?Reported home blood pressure readings:  ?- Tells me his meter at home is not working.  ? ?O:  ?Vitals:  ? 01/17/22 0923  ?BP: (!) 152/96  ? ? ?Last 3 Office BP readings: ?BP Readings from Last 3 Encounters:  ?01/17/22 (!) 152/96  ?12/18/21 (!) 168/89  ?07/19/21 (!) 150/90  ? ?BMET ?   ?Component Value Date/Time  ? NA 142 05/01/2021 0921  ? K 4.5 05/01/2021 0921  ? CL 109 (H) 05/01/2021 0254  ? CO2 20 05/01/2021 0921  ? GLUCOSE 88 05/01/2021 0921  ? GLUCOSE 95 10/16/2019 0858  ? BUN 14 05/01/2021 0921  ? CREATININE 0.90 05/01/2021 0921  ? CALCIUM 9.0 05/01/2021 0921  ? GFRNONAA 97 03/16/2020 0941  ? GFRAA 112 03/16/2020 0941  ? ?Renal function: ?CrCl cannot be calculated (Patient's most recent lab result is older than the maximum 21 days  allowed.). ? ?Clinical ASCVD: No  ?The ASCVD Risk score (Arnett DK, et al., 2019) failed to calculate for the following reasons: ?  The valid total cholesterol range is 130 to 320 mg/dL ? ?A/P: ?Hypertension longstanding above goal on current medications. BP Goal = < 130/80 mmHg. Medication adherence is optimal.  ?-Add amlodipine 5 mg daily to regimen. ?-Continued Cozaar '50mg'$  once daily.  ?-Counseled on lifestyle modifications for blood pressure control including reduced dietary sodium, increased exercise, adequate sleep. ? ?Results reviewed and written information provided. Total time in face-to-face counseling 15 minutes.   ?F/U Clinic Visit in 1 month.   ? ?Benard Halsted, PharmD, BCACP, CPP ?Clinical Pharmacist ?[place] ?[phone] ? ? ?

## 2022-02-18 ENCOUNTER — Encounter: Payer: Self-pay | Admitting: Pharmacist

## 2022-02-18 ENCOUNTER — Ambulatory Visit: Payer: Medicaid Other | Attending: Internal Medicine | Admitting: Pharmacist

## 2022-02-18 VITALS — BP 128/78 | HR 68

## 2022-02-18 DIAGNOSIS — K529 Noninfective gastroenteritis and colitis, unspecified: Secondary | ICD-10-CM | POA: Insufficient documentation

## 2022-02-18 DIAGNOSIS — I10 Essential (primary) hypertension: Secondary | ICD-10-CM | POA: Diagnosis present

## 2022-02-18 DIAGNOSIS — D649 Anemia, unspecified: Secondary | ICD-10-CM | POA: Diagnosis not present

## 2022-02-18 DIAGNOSIS — M5442 Lumbago with sciatica, left side: Secondary | ICD-10-CM | POA: Diagnosis not present

## 2022-02-18 NOTE — Progress Notes (Deleted)
htn

## 2022-02-18 NOTE — Progress Notes (Signed)
? ?  S:    ?PCP: Dr. Wynetta Emery  ? ?No chief complaint on file. ? ?Joe AURIEMMA Sr. is a 61 y.o. male who presents for hypertension evaluation, education, and management. 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 Patient was referred and last seen by Primary Care Provider, Dr. Wynetta Emery for a video visit on 11/19/21 and referred to Dr. Lurena Joiner. Patient was last seen by Dr. Lurena Joiner on 01/17/22 where his BP was 152/96 mmHg; amlodipine 5 mg was initiated in the visit. ? ?Today, patient arrives in good spirits and presents w/o assistance. Denies dizziness, headache, blurred vision, swelling and reports no concerns about his new prescription. ? ?Hypertension was first reported on 11/04/14. ? ?Family history: ?- Mother (deceased): cancer, HTN ?- Father (deceased): HTN ? ?Social history: Non-smoker, patient reports not consuming alcohol. ? ?Medication adherence optimal. Patient has taken all BP medications today (0730). ? ?Current antihypertensives include: losartan 50 mg, amlodipine 5 mg ? ?Antihypertensives tried in the past include: HCTZ (rash reported) ? ?Reported home BP readings: None; BP cuff at home is broken. ? ?Patient reported dietary habits: The patient has had no changes in diet since the last visit. He decreased his salt intake before and is still monitoring salt intake. The patient does not regularly consume caffeinated beverages. ? ?Patient-reported exercise habits: The patient does resistance training every day for 15 - 20 minutes and is pretty active at his job via walking. ? ?ASCVD risk factors include: HTN ? ? ?O:  ?Vitals:  ? 02/18/22 0909  ?BP: 128/78  ?Pulse: 68  ? ?Last 3 Office BP readings: ?BP Readings from Last 3 Encounters:  ?02/18/22 128/78  ?01/17/22 (!) 152/96  ?12/18/21 (!) 168/89  ? ?BMET ?   ?Component Value Date/Time  ? NA 142 05/01/2021 0921  ? K 4.5 05/01/2021 0921  ? CL 109 (H) 05/01/2021 4098  ? CO2 20 05/01/2021 0921  ?  GLUCOSE 88 05/01/2021 0921  ? GLUCOSE 95 10/16/2019 0858  ? BUN 14 05/01/2021 0921  ? CREATININE 0.90 05/01/2021 0921  ? CALCIUM 9.0 05/01/2021 0921  ? GFRNONAA 97 03/16/2020 0941  ? GFRAA 112 03/16/2020 0941  ? ?Renal function: ?CrCl cannot be calculated (Patient's most recent lab result is older than the maximum 21 days allowed.). ? ?Clinical ASCVD: No  ?The ASCVD Risk score (Arnett DK, et al., 2019) failed to calculate for the following reasons: ?  The valid total cholesterol range is 130 to 320 mg/dL ? ?A/P: ?Hypertension is currently controlled on current medications. BP goal < 130/80 mmHg. Medication adherence appears optimal. ?-No medication changes today. Commended patient on vast improvement to blood pressure. ?-Counseled on lifestyle modifications for blood pressure control including reduced dietary sodium, increased exercise, adequate sleep. ?-Encouraged patient to check BP at home and bring log of readings to next visit. Counseled on proper use of home BP cuff.  ? ?Results reviewed and written information provided. Patient verbalized understanding of treatment plan. Total time in face-to-face counseling 15 minutes.  ? ?Next visit is with PCP Dr. Wynetta Emery on 03/19/22. ? ?Patient seen with: ?Lanier Ensign, PharmD Candidate  ?[employer] ?[employer] ? ?Benard Halsted, PharmD, BCACP, CPP ?Clinical Pharmacist ?[place] ?[phone] ? ?

## 2022-02-19 ENCOUNTER — Telehealth: Payer: Self-pay

## 2022-02-19 LAB — LIPID PANEL
Chol/HDL Ratio: 1.6 ratio (ref 0.0–5.0)
Cholesterol, Total: 108 mg/dL (ref 100–199)
HDL: 66 mg/dL (ref >39)
LDL Chol Calc (NIH): 30 mg/dL (ref 0–99)
Triglycerides: 45 mg/dL (ref 0–149)
VLDL Cholesterol Cal: 12 mg/dL (ref 5–40)

## 2022-02-19 NOTE — Telephone Encounter (Signed)
Contacted pt to go over lab results pt didn't answer lvm  

## 2022-02-19 NOTE — Telephone Encounter (Signed)
Contacted pt to go over lab results pt is aware and doesn't have any questions or concerns 

## 2022-03-19 ENCOUNTER — Ambulatory Visit: Payer: Medicaid Other | Attending: Internal Medicine | Admitting: Internal Medicine

## 2022-03-19 ENCOUNTER — Ambulatory Visit: Payer: Self-pay | Admitting: *Deleted

## 2022-03-19 VITALS — BP 135/86 | HR 67 | Temp 98.2°F | Ht 70.5 in | Wt 156.0 lb

## 2022-03-19 DIAGNOSIS — I10 Essential (primary) hypertension: Secondary | ICD-10-CM | POA: Diagnosis present

## 2022-03-19 DIAGNOSIS — Z23 Encounter for immunization: Secondary | ICD-10-CM

## 2022-03-19 DIAGNOSIS — Z125 Encounter for screening for malignant neoplasm of prostate: Secondary | ICD-10-CM | POA: Diagnosis not present

## 2022-03-19 DIAGNOSIS — D649 Anemia, unspecified: Secondary | ICD-10-CM | POA: Diagnosis not present

## 2022-03-19 DIAGNOSIS — I728 Aneurysm of other specified arteries: Secondary | ICD-10-CM

## 2022-03-19 DIAGNOSIS — R7989 Other specified abnormal findings of blood chemistry: Secondary | ICD-10-CM | POA: Diagnosis not present

## 2022-03-19 DIAGNOSIS — R7303 Prediabetes: Secondary | ICD-10-CM | POA: Diagnosis not present

## 2022-03-19 MED ORDER — AMLODIPINE BESYLATE 10 MG PO TABS: 10.0000 mg | ORAL_TABLET | Freq: Every day | ORAL | 5 refills | Status: DC

## 2022-03-19 NOTE — Progress Notes (Signed)
Follow up HTN ?Needs RF on meds.  ?

## 2022-03-19 NOTE — Telephone Encounter (Signed)
2nd call attempted to contact patient. No answer. LVMTCB. [phone]. ?

## 2022-03-19 NOTE — Telephone Encounter (Signed)
Summary: advice - arm pain from injection  ? Pt was just seen today 5/16 and had a shot in his arm, pt stated he is now having arm pain and wanted to know if anything could be sent in for him, pt needed advice.   ?  ? ? ?Called patient to review arm pain. No answer, LVMTCB [phone]. ?

## 2022-03-19 NOTE — Patient Instructions (Addendum)
Your blood pressure is not at goal. ?We have increased the amlodipine from 5 mg daily to 10 mg daily.  I have sent an updated prescription to your pharmacy. ? ?

## 2022-03-19 NOTE — Progress Notes (Signed)
? ? ?Patient ID: Joe Loan Sr., male    DOB: 19-Oct-1961  MRN: [id number] ? ?CC: Hypertension ? ? ?Subjective: ?Joe [name] is a 61 y.o. male who presents for chronic disease management. 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 ? ?HYPERTENSION ?Currently taking: see medication list ?Med Adherence: [x] Yes patient on Cozaar 50 mg daily. Norvasc 5 mg added by clinical  pharmacist.  Evelina Bucy both meds already this a.m ?Medication side effects: [] Yes    [x] No ?Adherence with salt restriction: [x] Yes    [] No ?Home Monitoring?: [] Yes    [x] No, no device ?Monitoring Frequency:  ?Home BP results range:  ?SOB? [] Yes    [x] No ?Chest Pain?: [] Yes    [x] No ?Leg swelling?: [] Yes    [x] No ?Headaches?: [] Yes    [x] No ?Dizziness? [] Yes    [x] No ?Comments:  ? ?Abnormal LFTs: Right upper quadrant ultrasound revealed diffuse hepatic steatosis with dilated common bile duct but without significant change compared to prior study.  He was referred to gastroenterologist previously and was called but never called them back due to work schedule.  He was supposed to come to the lab to have repeat LFTs done after last visit and if still elevated we had planned to reinitiate the referral to the gastroenterologist.  He has not had the lab test done as yet. ? ?Anemia: Last CBC revealed mild anemia that was slightly worse with H/H 11.3/33.8 and low WBC of 2.6.  He denies any dizziness.  Repeat CBC and iron studies were ordered last visit but he did not come to have labs drawn ? 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  Moving bowels okay ? ?HM:  Due for Shingrix. Agrees to start series ?Patient Active Problem List  ? Diagnosis Date Noted  ? Mouth lesion 07/19/2021  ? Unexplained weight loss 07/19/2021  ? Chronic  diarrhea 07/19/2021  ? Normocytic anemia 07/19/2021  ? Full thickness tear of left subscapularis tendon 04/12/2020  ? Traumatic tear of supraspinatus tendon of left shoulder 04/12/2020  ? 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 Mass of pancreas 03/11/2019  ? Microcytic anemia   ? Chronic left-sided low back pain with left-sided sciatica 07/08/2018  ? Hypertension 11/04/2014  ?  ? ?Current Outpatient Medications on File Prior to Visit  ?Medication Sig Dispense Refill  ? Blood Pressure Monitor KIT Used to monitor blood pressure daily and as directed 1 each 0  ? losartan (COZAAR) 50 MG tablet Take 1 tablet (50 mg total) by mouth daily. 90 tablet 1  ? ?No current facility-administered medications on file prior to visit.  ? ? ?Allergies  ?Allergen Reactions  ? Hctz [Hydrochlorothiazide] Rash  ? ? ?Social History  ? ?Socioeconomic History  ? Marital status: Soil scientist  ?  Spouse name: [name]  ? Number of children: 2  ? Years of education: 75  ? Highest education level: 12th grade  ?Occupational History  ? Occupation: Packing/stacking/loading  ?  Comment: soaps, shaving implements, etc.  ?Tobacco Use  ? Smoking status: Never  ? Smokeless tobacco: Never  ?Vaping Use  ? Vaping Use: Never used  ?Substance and Sexual Activity  ? Alcohol use: Yes  ?  Comment: 6 pack of beer daily  ?  Drug use: No  ? Sexual activity: Yes  ?Other Topics Concern  ? Not on file  ?Social History Narrative  ? ** Merged History Encounter **  ?    ? Works second shift at [employer] ?Lives with his partner of 30+ years and 52 yo daughter, [name]. ? ?  ? ?Social Determinants of Health  ? ?Financial Resource Strain: Not on file  ?Food Insecurity: Not on file  ?Transportation Needs: Not on file  ?Physical Activity: Not on file  ?Stress: Not on file  ?Social Connections: Not on file  ?Intimate Partner Violence: Not on file  ? ? ?Family History  ?Problem Relation Age of Onset  ? Cancer Mother   ?      uncertain what was wrong--mother would not share, but gradual decline.  ? Hypertension Mother   ? Hypertension Father   ? ? ?Past Surgical History:  ?Procedure Laterality Date  ? COLONOSCOPY WITH PROPOFOL N/A 11/08/2018  ? Procedure: COLONOSCOPY WITH PROPOFOL;  Surgeon: Lavena Bullion, DO;  Location: [facility];  Service: Gastroenterology;  Laterality: N/A;  ? ESOPHAGOGASTRODUODENOSCOPY (EGD) WITH PROPOFOL N/A 11/06/2018  ? Procedure: ESOPHAGOGASTRODUODENOSCOPY (EGD) WITH PROPOFOL;  Surgeon: Carol Ada, MD;  Location: [facility];  Service: Endoscopy;  Laterality: N/A;  ? EUS  11/06/2018  ? Procedure: FULL UPPER ENDOSCOPIC ULTRASOUND (EUS) RADIAL;  Surgeon: Carol Ada, MD;  Location: [facility];  Service: Endoscopy;;  ? GIVENS CAPSULE STUDY N/A 11/08/2018  ? Procedure: GIVENS CAPSULE STUDY;  Surgeon: Lavena Bullion, DO;  Location: [facility];  Service: Gastroenterology;  Laterality: N/A;  ? IR ANGIOGRAM SELECTIVE EACH ADDITIONAL VESSEL  11/10/2018  ? IR ANGIOGRAM VISCERAL SELECTIVE  11/10/2018  ? IR ANGIOGRAM VISCERAL SELECTIVE  11/10/2018  ? IR EMBO ART  VEN HEMORR LYMPH EXTRAV  INC GUIDE ROADMAPPING  11/10/2018  ? IR US GUIDE VASC ACCESS RIGHT  11/10/2018  ? KNEE SURGERY Bilateral 1985  ? Arthroscopic for torn cartilage and ligaments.    ? SHOULDER OPEN ROTATOR CUFF REPAIR Left 05/10/2020  ? Procedure: LEFT SHOULDER OPEN ROTATOR CUFF REPAIR AND BICEPS TENODESIS;  Surgeon: Leandrew Koyanagi, MD;  Location: [facility];  Service: Orthopedics;  Laterality: Left;  ? ? ?ROS: ?Review of Systems ?Negative except as stated above ? ?PHYSICAL EXAM: ?BP 135/86   Pulse 67   Temp 98.2 ?F (36.8 ?C) (Oral)   Ht 5' 10.5" (1.791 m)   Wt 156 lb (70.8 kg)   SpO2 99%   BMI 22.07 kg/m?   ?Physical Exam ?144/97 ? ?General appearance - alert, well appearing, older African-American male and in no distress ?Mental status - normal mood, behavior, speech, dress, motor activity, and thought processes ?Mouth - mucous  membranes moist, pharynx normal without lesions ?Neck - supple, no significant adenopathy ?Chest - clear to auscultation, no wheezes, rales or rhonchi, symmetric air entry ?Heart - normal rate, regular rhythm, normal S1, S2, no murmurs, rubs, clicks or gallops ?Extremities - peripheral pulses normal, no pedal edema, no clubbing or cyanosis ?Skin: Small 1/2 cm raised soft movable soft tissue mass below the lower lip on the right side.  Patient states this was from an old injury.  He has had it for years and it has not changed in size. ? ?  Latest Ref Rng & Units 07/19/2021  ?  9:15 AM 05/01/2021  ?  9:21 AM 03/16/2020  ?  9:41 AM  ?CMP  ?Glucose 65 - 99 mg/dL  88   102    ?  BUN 6 - 24 mg/dL  14   8    ?Creatinine 0.76 - 1.27 mg/dL  0.90   0.84    ?Sodium 134 - 144 mmol/L  142   144    ?Potassium 3.5 - 5.2 mmol/L  4.5   4.6    ?Chloride 96 - 106 mmol/L  109   108    ?CO2 20 - 29 mmol/L  20   18    ?Calcium 8.7 - 10.2 mg/dL  9.0   9.3    ?Total Protein 6.0 - 8.5 g/dL 5.8   5.7   7.2    ?Total Bilirubin 0.0 - 1.2 mg/dL 1.6   0.9   1.5    ?Alkaline Phos 44 - 121 IU/L 129   127   132    ?AST 0 - 40 IU/L 72   60   194    ?ALT 0 - 44 IU/L 61   64   106    ? ?Lipid Panel  ?   ?Component Value Date/Time  ? CHOL 108 02/18/2022 0917  ? TRIG 45 02/18/2022 0917  ? HDL 66 02/18/2022 0917  ? CHOLHDL 1.6 02/18/2022 0917  ? Vici 30 02/18/2022 0917  ? ? ?CBC ?   ?Component Value Date/Time  ? WBC 2.6 (L) 07/19/2021 0915  ? WBC 2.7 (L) 10/16/2019 0858  ? RBC 4.19 07/19/2021 0915  ? RBC 3.52 (L) 10/16/2019 0931  ? RBC 3.35 (L) 10/16/2019 0858  ? HGB 11.3 (L) 07/19/2021 0915  ? HCT 33.8 (L) 07/19/2021 0915  ? PLT 265 07/19/2021 0915  ? MCV 81 07/19/2021 0915  ? MCH 27.0 07/19/2021 0915  ? MCH 16.4 (L) 10/16/2019 0858  ? MCHC 33.4 07/19/2021 0915  ? MCHC 26.1 (L) 10/16/2019 0858  ? RDW 13.2 07/19/2021 0915  ? LYMPHSABS 0.9 03/16/2020 0941  ? MONOABS 0.3 11/05/2018 1024  ? EOSABS 0.1 03/16/2020 0941  ? BASOSABS 0.0 03/16/2020 0941   ? ? ?ASSESSMENT AND PLAN: ?1. Essential hypertension ?Not at goal.  Increase amlodipine to 10 mg daily.  Continue Cozaar 50 mg daily. ?- amLODipine (NORVASC) 10 MG tablet; Take 1 tablet (10 mg total) by mouth daily.  Dispense:

## 2022-03-20 ENCOUNTER — Other Ambulatory Visit: Payer: Self-pay | Admitting: Internal Medicine

## 2022-03-20 DIAGNOSIS — R7989 Other specified abnormal findings of blood chemistry: Secondary | ICD-10-CM

## 2022-03-20 LAB — IRON,TIBC AND FERRITIN PANEL
Ferritin: 78 ng/mL (ref 30–400)
Iron Saturation: 32 % (ref 15–55)
Iron: 121 ug/dL (ref 38–169)
Total Iron Binding Capacity: 375 ug/dL (ref 250–450)
UIBC: 254 ug/dL (ref 111–343)

## 2022-03-20 LAB — CBC
Hematocrit: 39.3 % (ref 37.5–51.0)
Hemoglobin: 12.8 g/dL — ABNORMAL LOW (ref 13.0–17.7)
MCH: 27.2 pg (ref 26.6–33.0)
MCHC: 32.6 g/dL (ref 31.5–35.7)
MCV: 83 fL (ref 79–97)
Platelets: 304 10*3/uL (ref 150–450)
RBC: 4.71 x10E6/uL (ref 4.14–5.80)
RDW: 15 % (ref 11.6–15.4)
WBC: 2.6 10*3/uL — ABNORMAL LOW (ref 3.4–10.8)

## 2022-03-20 LAB — HEPATIC FUNCTION PANEL
ALT: 51 IU/L — ABNORMAL HIGH (ref 0–44)
AST: 66 IU/L — ABNORMAL HIGH (ref 0–40)
Albumin: 4.4 g/dL (ref 3.8–4.9)
Alkaline Phosphatase: 104 IU/L (ref 44–121)
Bilirubin Total: 0.9 mg/dL (ref 0.0–1.2)
Bilirubin, Direct: 0.29 mg/dL (ref 0.00–0.40)
Total Protein: 7.1 g/dL (ref 6.0–8.5)

## 2022-03-20 LAB — PSA: Prostate Specific Ag, Serum: 3.2 ng/mL (ref 0.0–4.0)

## 2022-03-20 NOTE — Telephone Encounter (Signed)
Third attempt to call pt. LM to call back . Clinic number provided. Routing to Pcp. ?

## 2022-03-20 NOTE — Telephone Encounter (Signed)
Left message on voicemail to return call.

## 2022-03-20 NOTE — Telephone Encounter (Signed)
Reason for Disposition ?? Third attempt to contact caller AND no contact made. Phone number verified. ? ?Protocols used: No Contact or Duplicate Contact Call-A-AH ? ?

## 2022-03-20 NOTE — Progress Notes (Signed)
Let patient know that his liver enzymes have decreased compared to previous check in September of last year but remains elevated.  I would like to refer him to the gastroenterologist for further evaluation.  Referral submitted.  If he is drinking more than two 12 oz beers a day, he should cut back. ?-No longer anemic.  White blood cell count remains low but stable.  We will continue to observe for now.  Iron level normal. ?-PSA which is the screening test for prostate cancer is within normal range.

## 2022-03-21 ENCOUNTER — Telehealth: Payer: Self-pay

## 2022-03-21 NOTE — Telephone Encounter (Signed)
Contacted pt to go over lab results pt is aware and doesn't have any questions or concerns 

## 2022-03-22 ENCOUNTER — Telehealth: Payer: Self-pay | Admitting: Internal Medicine

## 2022-03-22 NOTE — Telephone Encounter (Signed)
Copied from [place] [id number]. Topic: General - Other >> Mar 22, 2022  2:04 PM Joe [name] A wrote: Reason for CRM: The patient has called to request a printed copy of their lab results from 03/19/22 The patient would like to pick up that copy today 03/22/22 Please contact the patient further if needed

## 2022-03-22 NOTE — Telephone Encounter (Signed)
Labs has been printed and placed up front for pick up.

## 2022-06-19 ENCOUNTER — Encounter: Payer: Self-pay | Admitting: Internal Medicine

## 2022-06-19 ENCOUNTER — Ambulatory Visit (INDEPENDENT_AMBULATORY_CARE_PROVIDER_SITE_OTHER): Payer: Medicaid Other | Admitting: Internal Medicine

## 2022-06-19 ENCOUNTER — Other Ambulatory Visit (INDEPENDENT_AMBULATORY_CARE_PROVIDER_SITE_OTHER): Payer: Medicaid Other

## 2022-06-19 VITALS — BP 122/62 | HR 80 | Ht 70.5 in | Wt 147.4 lb

## 2022-06-19 DIAGNOSIS — K8689 Other specified diseases of pancreas: Secondary | ICD-10-CM | POA: Diagnosis not present

## 2022-06-19 DIAGNOSIS — R748 Abnormal levels of other serum enzymes: Secondary | ICD-10-CM | POA: Diagnosis not present

## 2022-06-19 DIAGNOSIS — K838 Other specified diseases of biliary tract: Secondary | ICD-10-CM | POA: Diagnosis not present

## 2022-06-19 DIAGNOSIS — K760 Fatty (change of) liver, not elsewhere classified: Secondary | ICD-10-CM

## 2022-06-19 DIAGNOSIS — R195 Other fecal abnormalities: Secondary | ICD-10-CM

## 2022-06-19 DIAGNOSIS — D649 Anemia, unspecified: Secondary | ICD-10-CM

## 2022-06-19 DIAGNOSIS — F1010 Alcohol abuse, uncomplicated: Secondary | ICD-10-CM | POA: Diagnosis not present

## 2022-06-19 DIAGNOSIS — K635 Polyp of colon: Secondary | ICD-10-CM

## 2022-06-19 LAB — COMPREHENSIVE METABOLIC PANEL
ALT: 50 U/L (ref 0–53)
AST: 73 U/L — ABNORMAL HIGH (ref 0–37)
Albumin: 4.3 g/dL (ref 3.5–5.2)
Alkaline Phosphatase: 96 U/L (ref 39–117)
BUN: 10 mg/dL (ref 6–23)
CO2: 23 mEq/L (ref 19–32)
Calcium: 9.1 mg/dL (ref 8.4–10.5)
Chloride: 105 mEq/L (ref 96–112)
Creatinine, Ser: 0.77 mg/dL (ref 0.40–1.50)
GFR: 97.01 mL/min (ref >60.00)
Glucose, Bld: 99 mg/dL (ref 70–99)
Potassium: 3.9 mEq/L (ref 3.5–5.1)
Sodium: 139 mEq/L (ref 135–145)
Total Bilirubin: 1 mg/dL (ref 0.2–1.2)
Total Protein: 7.3 g/dL (ref 6.0–8.3)

## 2022-06-19 LAB — VITAMIN B12: Vitamin B-12: 174 pg/mL — ABNORMAL LOW (ref 211–911)

## 2022-06-19 LAB — CBC WITH DIFFERENTIAL/PLATELET
Basophils Absolute: 0 10*3/uL (ref 0.0–0.1)
Basophils Relative: 0.9 % (ref 0.0–3.0)
Eosinophils Absolute: 0 10*3/uL (ref 0.0–0.7)
Eosinophils Relative: 1.7 % (ref 0.0–5.0)
HCT: 39.7 % (ref 39.0–52.0)
Hemoglobin: 12.9 g/dL — ABNORMAL LOW (ref 13.0–17.0)
Lymphocytes Relative: 30.8 % (ref 12.0–46.0)
Lymphs Abs: 0.7 10*3/uL (ref 0.7–4.0)
MCHC: 32.5 g/dL (ref 30.0–36.0)
MCV: 83.5 fl (ref 78.0–100.0)
Monocytes Absolute: 0.3 10*3/uL (ref 0.1–1.0)
Monocytes Relative: 15.6 % — ABNORMAL HIGH (ref 3.0–12.0)
Neutro Abs: 1.1 10*3/uL — ABNORMAL LOW (ref 1.4–7.7)
Neutrophils Relative %: 51 % (ref 43.0–77.0)
Platelets: 291 10*3/uL (ref 150.0–400.0)
RBC: 4.76 Mil/uL (ref 4.22–5.81)
RDW: 15.2 % (ref 11.5–15.5)
WBC: 2.2 10*3/uL — ABNORMAL LOW (ref 4.0–10.5)

## 2022-06-19 LAB — AMYLASE: Amylase: 56 U/L (ref 27–131)

## 2022-06-19 LAB — LIPASE: Lipase: 8 U/L — ABNORMAL LOW (ref 11.0–59.0)

## 2022-06-19 NOTE — Progress Notes (Signed)
Joe Panas Mallek Sr. 61 y.o. 05-05-1961 [id number]  Assessment & Plan:   Encounter Diagnoses  Name Primary?   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  However he does have this history of an abnormal pancreas and a persistently mildly dilated bile duct.  He also describes stools that might be steatorrhea.  He has dilated pancreatic duct changes on prior imaging that suggest chronic pancreatitis as well.  Work-up as below.  Reduce and try to eliminate alcohol.  Further plans pending below.   Orders Placed This Encounter  Procedures   MR ABDOMEN MRCP W WO CONTAST   CBC with Differential/Platelet   Comprehensive metabolic panel   Amylase   Lipase   ANA   Vitamin B12   Pancreatic Elastase, Fecal    Note his colonoscopy was not adequate for polyp evaluation and he should have 1 to look for this polyp that was not removed and evaluate for others.  We will schedule that pending the above work-up.  CC: [name], MD  Subjective:   Chief Complaint: Abnormal LFTs  HPI 61 year old African-American man with a history of GI bleeding, acute pancreatitis and chronic pancreatitis changes on prior imaging and a pancreatic mass lesion in 2020 that resolved after embolization of a pseudoaneurysm.  He had seen me once via telehealth during the [place] pandemic after that but was actually following with Dr. Benson Norway but has not seen him in some time.  He has been seen in primary care and transaminases have been elevated.  He reports that he eats a lot but cannot gain weight.  He drinks a 40 ounce beer each day.  2-3 stools a day that sometimes seem orange and greasy.  Sometimes he  feels slightly dizzy when he gets up once or twice a week.  He does not drink soda or other sugary beverages, he says he drinks Kool-Aid but does not add sugar to the mix.  He works second shift at [employer] for many years.  No abdominal pain.  Previous work-up as below.  He has also had negative HCV antibody positive hep B surface antibody negative surface antigen in 2022.  Mitochondrial antibodies were negative.  No ANA.  Ferritin was actually low in 2020 at 5.  In May it was 9.  Latest Reference Range & Units 07/19/21 09:15 03/19/22 09:53  AST 0 - 40 IU/L 72 (H) 66 (H)  ALT 0 - 44 IU/L 61 (H) 51 (H)  Total Protein 6.0 - 8.5 g/dL 5.8 (L) 7.1  Total Bilirubin 0.0 - 1.2 mg/dL 1.6 (H) 0.9  (H): Data is abnormally high (L): Data is abnormally low  Wt Readings from Last 3 Encounters:  06/19/22 147 lb 6 oz (66.8 kg)  03/19/22 156 lb (70.8 kg)  07/19/21 153 lb 3.2 oz (69.5 kg)     RUQ US IMPRESSION: No evidence of cholelithiasis.   Diffuse hepatic steatosis.   Dilated common bile duct measuring 11 mm, but without significant change compared to prior study.     Electronically Signed   By: [name] M.D.   On: 08/01/2021 10:38  MR Abdomen IMPRESSION: 1. Following embolization of the previously noted gastroduodenal artery pseudoaneurysm, the mass-like enlargement in the head of the pancreas has essentially resolved. There continues to be ectasia and ductal dilatation throughout the body and tail of the pancreas, which may suggest residual stricture of the pancreatic duct. In addition, there is marked narrowing of the distal common bile duct which could suggest a common bile duct stricture. At this time, the proximal common bile duct is mildly dilated measuring up to 12 mm in the porta hepatis, but there is no significant intrahepatic biliary ductal dilatation.     Electronically Signed   By: [name] M.D.   On: 04/15/2019 09:16  Colonoscopy 11/08/2018 -  Preparation of the colon was fair and adequate for the purposes of this study. Visualization improved with copious irrigation and lavage. - Blood-tinged, marroon fluid noted throughout the colon. This was lavaged as above. No active bleeding site noted on this study as noted above. - One 4 mm polyp in the transverse colon. This was not resected. - Non-bleeding internal hemorrhoids. - The visualized mucosa was otherwise normal appearing throughout the colon. No areas of mucosal erythema, edema, erosions, or ulceration noted. - The examined portion of the ileum was normal. - No specimens collected.  EGD 11/06/2018  Normal  EUS 11/06/2018-Dr. Benson Norway - Ascites was found on endosonographic examination of the peritoneal cavity. - A mass was identified in the pancreatic head. - There was dilation in the middle third of the main bile duct and in the upper third of the main bile duct which measured up to 13 mm. - There was no sign of significant pathology in the gallbladder. - No specimens collected. Allergies  Allergen Reactions   Hctz [Hydrochlorothiazide] Rash   Current Meds  Medication Sig   amLODipine (NORVASC) 10 MG tablet Take 1 tablet (10 mg total) by mouth daily.   Blood Pressure Monitor KIT Used to monitor blood pressure daily and as directed   losartan (COZAAR) 50 MG tablet Take 1 tablet (50 mg total) by mouth daily.   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 03/11/2019   Past Surgical History:  Procedure Laterality Date   COLONOSCOPY WITH PROPOFOL N/A 11/08/2018   Procedure: COLONOSCOPY WITH PROPOFOL;  Surgeon: Lavena Bullion, DO;  Location: [facility];  Service: Gastroenterology;  Laterality: N/A;   ESOPHAGOGASTRODUODENOSCOPY (EGD) WITH PROPOFOL N/A 11/06/2018   Procedure:  ESOPHAGOGASTRODUODENOSCOPY (EGD) WITH PROPOFOL;  Surgeon: Carol Ada, MD;  Location: [facility];  Service: Endoscopy;  Laterality: N/A;   EUS  11/06/2018   Procedure: FULL UPPER ENDOSCOPIC ULTRASOUND (EUS) RADIAL;  Surgeon: Carol Ada, MD;  Location: [facility];  Service: Endoscopy;;   GIVENS CAPSULE STUDY N/A 11/08/2018   Procedure: GIVENS CAPSULE STUDY;  Surgeon: Lavena Bullion, DO;  Location: [facility];  Service: Gastroenterology;  Laterality: N/A;   IR ANGIOGRAM SELECTIVE EACH ADDITIONAL VESSEL  11/10/2018   IR ANGIOGRAM VISCERAL SELECTIVE  11/10/2018   IR ANGIOGRAM VISCERAL SELECTIVE  11/10/2018   IR EMBO ART  VEN HEMORR LYMPH EXTRAV  INC GUIDE ROADMAPPING  11/10/2018   IR US GUIDE VASC ACCESS RIGHT  11/10/2018   KNEE SURGERY Bilateral 1985   Arthroscopic for torn cartilage and ligaments.     SHOULDER OPEN ROTATOR CUFF REPAIR  Left 05/10/2020   Procedure: LEFT SHOULDER OPEN ROTATOR CUFF REPAIR AND BICEPS TENODESIS;  Surgeon: Leandrew Koyanagi, MD;  Location: [facility];  Service: Orthopedics;  Laterality: Left;   Social History   Social History Narrative   Works second shift at [employer] and [employer] with his partner of 30+ years and adult daughter [name].   40 oz beer/day   No drug   Never smoker      family history includes Cancer in his mother; Hypertension in his father and mother.   Review of Systems See HPI  Objective:   Physical Exam _0  122/62   Pulse 80   Ht 5' 10.5" (1.791 m)   Wt 147 lb 6 oz (66.8 kg)   BMI 20.85 kg/m @  General:  Well-developed, well-nourished and in no acute distress Eyes:  anicteric. ENT:   Mouth and posterior pharynx free of lesions.  Neck:   supple w/o thyromegaly or mass.  Lungs: Clear to auscultation bilaterally. Heart:  S1S2, no rubs, murmurs, gallops. Abdomen:  soft, non-tender, no hepatosplenomegaly, hernia, or mass and BS+.   Lymph:  no cervical or supraclavicular adenopathy. Extremities:   no edema, cyanosis  or clubbing Skin   no rash. Neuro:  A&O x 3.  Psych:  appropriate mood and  Affect.   Data Reviewed: See

## 2022-06-19 NOTE — Patient Instructions (Signed)
Your provider has requested that you go to the basement level for lab work before leaving today. Press "B" on the elevator. The lab is located at the first door on the left as you exit the elevator.  Due to recent changes in healthcare laws, you may see the results of your imaging and laboratory studies on MyChart before your provider has had a chance to review them.  We understand that in some cases there may be results that are confusing or concerning to you. Not all laboratory results come back in the same time frame and the provider may be waiting for multiple results in order to interpret others.  Please give [place] 48 hours in order for your provider to thoroughly review all the results before contacting the office for clarification of your results.   Try to cut down/eliminate alcohol.  You will be contacted by [facility] in the next 2 days to arrange a MRCP.  The number on your caller ID will be [phone], please answer when they call.  If you have not heard from them in 2 days please call [phone] to schedule.    You have been scheduled for an MRCP at ____________ on ________________. Your appointment time is ______________. Please arrive to admitting (at main entrance of the hospital) 15 minutes prior to your appointment time for registration purposes. Please make certain not to have anything to eat or drink 6 hours prior to your test. In addition, if you have any metal in your body, have a pacemaker or defibrillator, please be sure to let your ordering physician know. This test typically takes 45 minutes to 1 hour to complete. Should you need to reschedule, please call [phone] to do so.   I appreciate the opportunity to care for you. Silvano Rusk, MD, [facility]

## 2022-06-20 ENCOUNTER — Other Ambulatory Visit: Payer: Medicaid Other

## 2022-06-20 DIAGNOSIS — R748 Abnormal levels of other serum enzymes: Secondary | ICD-10-CM

## 2022-06-20 DIAGNOSIS — R195 Other fecal abnormalities: Secondary | ICD-10-CM

## 2022-06-21 ENCOUNTER — Other Ambulatory Visit: Payer: Self-pay

## 2022-06-21 DIAGNOSIS — E538 Deficiency of other specified B group vitamins: Secondary | ICD-10-CM

## 2022-06-21 MED ORDER — VITAMIN B-12 1000 MCG PO TABS: 1000.0000 ug | ORAL_TABLET | Freq: Every day | ORAL | 2 refills | Status: DC

## 2022-06-22 ENCOUNTER — Telehealth: Payer: Self-pay | Admitting: Internal Medicine

## 2022-06-22 LAB — ANTI-NUCLEAR AB-TITER (ANA TITER): ANA Titer 1: 1:40 {titer} — ABNORMAL HIGH

## 2022-06-22 LAB — ANA: Anti Nuclear Antibody (ANA): POSITIVE — AB

## 2022-06-26 NOTE — Telephone Encounter (Signed)
RTC spoke w/ pt in regards to info per pcp in regards to Vitamin B12 levels and being scheduled for nurse visit. Pt expressed understanding. Pt scheduled for nurse visit Weds. 08/30'@8'$ :30am..----DD,RMA

## 2022-06-26 NOTE — Telephone Encounter (Signed)
LVM for pt to RTC.--DD,RMA

## 2022-06-26 NOTE — Telephone Encounter (Signed)
Patient returned the call please assist patient further regarding voice mall and labs

## 2022-06-28 LAB — PANCREATIC ELASTASE, FECAL: Pancreatic Elastase-1, Stool: <15 mcg/g — ABNORMAL LOW

## 2022-07-01 ENCOUNTER — Other Ambulatory Visit: Payer: Self-pay | Admitting: Internal Medicine

## 2022-07-01 DIAGNOSIS — K8689 Other specified diseases of pancreas: Secondary | ICD-10-CM

## 2022-07-01 MED ORDER — PANCRELIPASE (LIP-PROT-AMYL) 36000-114000 UNITS PO CPEP: ORAL_CAPSULE | ORAL | 5 refills | Status: DC

## 2022-07-03 ENCOUNTER — Ambulatory Visit: Payer: Medicaid Other | Attending: Internal Medicine

## 2022-07-03 DIAGNOSIS — E559 Vitamin D deficiency, unspecified: Secondary | ICD-10-CM

## 2022-07-03 DIAGNOSIS — E538 Deficiency of other specified B group vitamins: Secondary | ICD-10-CM

## 2022-07-03 MED ORDER — CYANOCOBALAMIN 1000 MCG/ML IJ SOLN
1000.0000 ug | Freq: Once | INTRAMUSCULAR | Status: AC
  Administered 2022-07-03: 1000 ug via INTRAMUSCULAR

## 2022-07-03 NOTE — Patient Instructions (Signed)
Vitamin B12 Injection What is this medication? Vitamin B12 (VAHY tuh min B12) prevents and treats low vitamin B12 levels in your body. It is used in people who do not get enough vitamin B12 from their diet or when their digestive tract does not absorb enough. Vitamin B12 plays an important role in maintaining the health of your nervous system and red blood cells. This medicine may be used for other purposes; ask your health care provider or pharmacist if you have questions. COMMON BRAND NAME(S): B-12 Compliance Kit, B-12 Injection Kit, Cyomin, Dodex, LA-12, Nutri-Twelve, Physicians EZ Use B-12, Primabalt What should I tell my care team before I take this medication? They need to know if you have any of these conditions: Kidney disease Leber's disease Megaloblastic anemia An unusual or allergic reaction to cyanocobalamin, cobalt, other medications, foods, dyes, or preservatives Pregnant or trying to get pregnant Breast-feeding How should I use this medication? This medication is injected into a muscle or deeply under the skin. It is usually given in a clinic or care team's office. However, your care team may teach you how to inject yourself. Follow all instructions. Talk to your care team about the use of this medication in children. Special care may be needed. Overdosage: If you think you have taken too much of this medicine contact a poison control center or emergency room at once. NOTE: This medicine is only for you. Do not share this medicine with others. What if I miss a dose? If you are given your dose at a clinic or care team's office, call to reschedule your appointment. If you give your own injections, and you miss a dose, take it as soon as you can. If it is almost time for your next dose, take only that dose. Do not take double or extra doses. What may interact with this medication? Alcohol Colchicine This list may not describe all possible interactions. Give your health care  provider a list of all the medicines, herbs, non-prescription drugs, or dietary supplements you use. Also tell them if you smoke, drink alcohol, or use illegal drugs. Some items may interact with your medicine. What should I watch for while using this medication? Visit your care team regularly. You may need blood work done while you are taking this medication. You may need to follow a special diet. Talk to your care team. Limit your alcohol intake and avoid smoking to get the best benefit. What side effects may I notice from receiving this medication? Side effects that you should report to your care team as soon as possible: Allergic reactions--skin rash, itching, hives, swelling of the face, lips, tongue, or throat Swelling of the ankles, hands, or feet Trouble breathing Side effects that usually do not require medical attention (report to your care team if they continue or are bothersome): Diarrhea This list may not describe all possible side effects. Call your doctor for medical advice about side effects. You may report side effects to FDA at 1-800-FDA-1088. Where should I keep my medication? Keep out of the reach of children. Store at room temperature between 15 and 30 degrees C (59 and 85 degrees F). Protect from light. Throw away any unused medication after the expiration date. NOTE: This sheet is a summary. It may not cover all possible information. If you have questions about this medicine, talk to your doctor, pharmacist, or health care provider.  2023 Elsevier/Gold Standard (2020-12-28 00:00:00)

## 2022-07-03 NOTE — Progress Notes (Signed)
Pt arrived for b-12 injection B-12 injection given in right deltoid muscle. Pt tolerated injection well.

## 2022-07-04 ENCOUNTER — Ambulatory Visit (HOSPITAL_COMMUNITY)
Admission: RE | Admit: 2022-07-04 | Discharge: 2022-07-04 | Disposition: A | Discharge status: Home / Self Care | Payer: Medicaid Other | Source: Ambulatory Visit | Attending: Internal Medicine | Admitting: Internal Medicine

## 2022-07-04 ENCOUNTER — Other Ambulatory Visit: Payer: Self-pay | Admitting: Internal Medicine

## 2022-07-04 DIAGNOSIS — K838 Other specified diseases of biliary tract: Secondary | ICD-10-CM | POA: Diagnosis not present

## 2022-07-04 MED ORDER — GADOBUTROL 1 MMOL/ML IV SOLN
6.0000 mL | Freq: Once | INTRAVENOUS | Status: AC | PRN
  Administered 2022-07-04: 6 mL via INTRAVENOUS

## 2022-07-09 ENCOUNTER — Other Ambulatory Visit: Payer: Self-pay

## 2022-07-09 DIAGNOSIS — K8689 Other specified diseases of pancreas: Secondary | ICD-10-CM

## 2022-07-09 DIAGNOSIS — K838 Other specified diseases of biliary tract: Secondary | ICD-10-CM

## 2022-07-12 ENCOUNTER — Telehealth: Payer: Self-pay | Admitting: Internal Medicine

## 2022-07-12 NOTE — Telephone Encounter (Signed)
Patient returned your call, please advise. 

## 2022-07-12 NOTE — Telephone Encounter (Signed)
See alternate note  

## 2022-07-30 ENCOUNTER — Other Ambulatory Visit: Payer: Self-pay

## 2022-07-30 ENCOUNTER — Ambulatory Visit: Payer: Medicaid Other | Attending: Internal Medicine | Admitting: Internal Medicine

## 2022-07-30 VITALS — BP 132/79 | HR 66 | Temp 97.7°F | Ht 70.0 in | Wt 152.6 lb

## 2022-07-30 DIAGNOSIS — Z23 Encounter for immunization: Secondary | ICD-10-CM

## 2022-07-30 DIAGNOSIS — Z79899 Other long term (current) drug therapy: Secondary | ICD-10-CM | POA: Insufficient documentation

## 2022-07-30 DIAGNOSIS — K861 Other chronic pancreatitis: Secondary | ICD-10-CM

## 2022-07-30 DIAGNOSIS — R7989 Other specified abnormal findings of blood chemistry: Secondary | ICD-10-CM | POA: Diagnosis not present

## 2022-07-30 DIAGNOSIS — K838 Other specified diseases of biliary tract: Secondary | ICD-10-CM | POA: Diagnosis not present

## 2022-07-30 DIAGNOSIS — E538 Deficiency of other specified B group vitamins: Secondary | ICD-10-CM

## 2022-07-30 DIAGNOSIS — I10 Essential (primary) hypertension: Secondary | ICD-10-CM

## 2022-07-30 DIAGNOSIS — K760 Fatty (change of) liver, not elsewhere classified: Secondary | ICD-10-CM

## 2022-07-30 DIAGNOSIS — D72819 Decreased white blood cell count, unspecified: Secondary | ICD-10-CM

## 2022-07-30 DIAGNOSIS — K8689 Other specified diseases of pancreas: Secondary | ICD-10-CM

## 2022-07-30 DIAGNOSIS — Z2821 Immunization not carried out because of patient refusal: Secondary | ICD-10-CM

## 2022-07-30 DIAGNOSIS — D709 Neutropenia, unspecified: Secondary | ICD-10-CM | POA: Diagnosis not present

## 2022-07-30 MED ORDER — PANCRELIPASE (LIP-PROT-AMYL) 36000-114000 UNITS PO CPEP
ORAL_CAPSULE | ORAL | 5 refills | Status: DC
  Filled 2022-07-30: qty 240, 27d supply, fill #0
  Filled 2022-07-30: qty 240, 30d supply, fill #0

## 2022-07-30 NOTE — Progress Notes (Unsigned)
Patient ID: Joe Loan Sr., male    DOB: 03-01-1961  MRN: [id number]  CC: Chronic disease management  Subjective: Joe [name] is a 61 y.o. 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  Pt does not have meds with him  HTN: Reports compliance with Norvasc 10 mg, Cozaar 50 mg daily. Took medications already for today. Limits salt in the foods. No chest pains, shortness of breath, lower extremity edema. No device to check BP  Abnormal LFTs: AST and ALT had improved from 72/61 to 66/51. -Referred to GI Joe [name] for further evaluation of abnormal LFTs and hepatic steatosis. -Patient with history of dilated bile duct and pancreatic duct.  His assessment was that patient has EtOH induced +/- NAFLD as cause of elev LFTs.  We also assessed patient to have chronic pancreatitis based on steatorrhea, dilated pancreatic duct changes on prior imaging and abnormal fecal pancreatic elastase test.  MRI/MRCP of the abdomen revealed increasing dilation of the main pancreatic duct; findings suggest chronic pancreatitis with stricture.  Stable appearance of biliary duct dilation. Prescribed Creon. Found to have Vit B12 def with level of 174. Rec pt take Vit B12 supplement 1000 mcg daily and to f/u with me regarding B12 inj. besides mild anemia on CBC, patient noted to have decreased WBC with trend over past yr being 2.2-3. -per GI note, pt admitted to drinking 40 oz beer a day.  Today he reports he has cut back to one 12 oz beer every other day.   Pt does not have Creon as yet.  States [employer] did not have it.   EUS scheduled for 08/22/2022. Received Vit B12 inj from [place] 07/03/22 and was also taking Vit B12 oral supplement 1000 mcg daily.  Reports frequent  BM after getting the shot.  He change to taking the oral B12 every other day because of this.    HM:  declines flu shot.  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 30 tablet 5   Blood Pressure Monitor KIT Used to monitor blood pressure daily and as directed 1 each 0   cyanocobalamin (VITAMIN B12) 1000 MCG tablet Take 1 tablet (1,000 mcg total) by mouth daily. 30 tablet 2   losartan (COZAAR) 50 MG tablet Take 1 tablet (50 mg total) by mouth daily. 90 tablet 1   No current facility-administered medications on file prior to visit.    Allergies  Allergen Reactions   Hctz [Hydrochlorothiazide] Rash    Social History   Socioeconomic History   Marital status: Joe [name]    Spouse name: Joe [name]   Number  of children: 2   Years of education: 12   Highest education level: 12th grade  Occupational History   Occupation: Packing/stacking/loading    Comment: soaps, shaving implements, etc.  Tobacco Use   Smoking status: Never   Smokeless tobacco: Never  Vaping Use   Vaping Use: Never used  Substance and Sexual Activity   Alcohol use: Yes    Comment: 6 pack of beer daily   Drug use: No   Sexual activity: Yes  Other Topics Concern   Not on file  Social History Narrative   Works second shift  at [employer] and [place] with his partner of 30+ years and adult daughter Joe [name].   40 oz beer/day   No drug   Never smoker      Social Determinants of Radio broadcast assistant Strain: Not on file  Food Insecurity: Not on file  Transportation Needs: Not on file  Physical Activity: Not on file  Stress: Not on file  Social Connections: Not on file  Intimate Partner Violence: Not At Risk (02/18/2018)   Humiliation, Afraid, Rape, and Kick questionnaire    Fear of Current or Ex-Partner: No    Emotionally Abused: No    Physically Abused: No    Sexually Abused: No    Family History  Problem Relation Age of Onset   Cancer Mother        uncertain what was wrong--mother would not share, but gradual decline.   Hypertension Mother    Hypertension Father     Past Surgical History:  Procedure Laterality Date   COLONOSCOPY WITH PROPOFOL N/A 11/08/2018   Procedure: COLONOSCOPY WITH PROPOFOL;  Surgeon: Joe Bullion, Joe [name];  Location: [facility];  Service: Gastroenterology;  Laterality: N/A;   ESOPHAGOGASTRODUODENOSCOPY (EGD) WITH PROPOFOL N/A 11/06/2018   Procedure: ESOPHAGOGASTRODUODENOSCOPY (EGD) WITH PROPOFOL;  Surgeon: Joe Ada, Joe [name];  Location: [facility];  Service: Endoscopy;  Laterality: N/A;   EUS  11/06/2018   Procedure: FULL UPPER ENDOSCOPIC ULTRASOUND (EUS) RADIAL;  Surgeon: Joe Ada, Joe [name];  Location: [facility];  Service: Endoscopy;;   GIVENS CAPSULE STUDY N/A 11/08/2018   Procedure: GIVENS CAPSULE STUDY;  Surgeon: Joe Bullion, Joe [name];  Location: [facility];  Service: Gastroenterology;  Laterality: N/A;   IR ANGIOGRAM SELECTIVE EACH ADDITIONAL VESSEL  11/10/2018   IR ANGIOGRAM VISCERAL SELECTIVE  11/10/2018   IR ANGIOGRAM VISCERAL SELECTIVE  11/10/2018   IR EMBO ART  VEN HEMORR LYMPH EXTRAV  INC GUIDE ROADMAPPING  11/10/2018   IR US GUIDE VASC ACCESS RIGHT  11/10/2018   KNEE SURGERY Bilateral 1985   Arthroscopic for torn cartilage and ligaments.     SHOULDER OPEN  ROTATOR CUFF REPAIR Left 05/10/2020   Procedure: LEFT SHOULDER OPEN ROTATOR CUFF REPAIR AND BICEPS TENODESIS;  Surgeon: Leandrew Koyanagi, Joe [name];  Location: [facility];  Service: Orthopedics;  Laterality: Left;    ROS: Review of Systems Negative except as stated above  PHYSICAL EXAM: BP 132/79   Pulse 66   Temp 97.7 F (36.5 C) (Oral)   Ht 5' 10" (1.778 m)   Wt 152 lb 9.6 oz (69.2 kg)   BMI 21.90 kg/m   Wt Readings from Last 3 Encounters:  07/30/22 152 lb 9.6 oz (69.2 kg)  06/19/22 147 lb 6 oz (66.8 kg)  03/19/22 156 lb (70.8 kg)    Physical Exam   General appearance - alert, well appearing, older African-American male and in no distress Mental status - normal mood, behavior,  speech, dress, motor activity, and thought processes Neck - supple, no significant adenopathy Chest - clear to auscultation, no wheezes, rales or rhonchi, symmetric air entry Heart - normal rate, regular rhythm, normal S1, S2, no murmurs, rubs, clicks or gallops Extremities - peripheral pulses normal, no pedal edema, no clubbing or cyanosis     Latest Ref Rng & Units 06/19/2022    9:21 AM 03/19/2022    9:53 AM 07/19/2021    9:15 AM  CMP  Glucose 70 - 99 mg/dL 99     BUN 6 - 23 mg/dL 10     Creatinine 0.40 - 1.50 mg/dL 0.77     Sodium 135 - 145 mEq/L 139     Potassium 3.5 - 5.1 mEq/L 3.9     Chloride 96 - 112 mEq/L 105     CO2 19 - 32 mEq/L 23     Calcium 8.4 - 10.5 mg/dL 9.1     Total Protein 6.0 - 8.3 g/dL 7.3  7.1  5.8   Total Bilirubin 0.2 - 1.2 mg/dL 1.0  0.9  1.6   Alkaline Phos 39 - 117 U/L 96  104  129   AST 0 - 37 U/L 73  66  72   ALT 0 - 53 U/L 50  51  61    Lipid Panel     Component Value Date/Time   CHOL 108 02/18/2022 0917   TRIG 45 02/18/2022 0917   HDL 66 02/18/2022 0917   CHOLHDL 1.6 02/18/2022 0917   LDLCALC 30 02/18/2022 0917    CBC    Component Value Date/Time   WBC 2.2 Repeated and verified X2. (L) 06/19/2022 0921   RBC 4.76 06/19/2022 0921   HGB 12.9 (L)  06/19/2022 0921   HGB 12.8 (L) 03/19/2022 0953   HCT 39.7 06/19/2022 0921   HCT 39.3 03/19/2022 0953   PLT 291.0 06/19/2022 0921   PLT 304 03/19/2022 0953   MCV 83.5 06/19/2022 0921   MCV 83 03/19/2022 0953   MCH 27.2 03/19/2022 0953   MCH 16.4 (L) 10/16/2019 0858   MCHC 32.5 06/19/2022 0921   RDW 15.2 06/19/2022 0921   RDW 15.0 03/19/2022 0953   LYMPHSABS 0.7 06/19/2022 0921   LYMPHSABS 0.9 03/16/2020 0941   MONOABS 0.3 06/19/2022 0921   EOSABS 0.0 06/19/2022 0921   EOSABS 0.1 03/16/2020 0941   BASOSABS 0.0 06/19/2022 0921   BASOSABS 0.0 03/16/2020 0941    ASSESSMENT AND PLAN: 1. Essential hypertension Close to goal. Continue amlodipine 10 mg daily and Cozaar 50 mg daily.  2. Vitamin B12 deficiency His first and last B12 shot was 1 month ago.  He is taking oral B12 supplement 1000 mcg every other day.  I recommend another B12 shot today but patient declined stating that it caused diarrhea for him the last time.  Informed him that I think the diarrhea is most likely related to his chronic pancreatitis but patient still declined.  Went over signs and symptoms that can develop as a result of B12 deficiency.  Recommend that he takes the oral vitamin B12 supplement daily instead of every other day. - CBC With Diff/Platelet; Future - Vitamin B12; Future  3. Neutropenia, unspecified type ([place]) Likely due to B12 deficiency.  Recommend that he returns to the lab in about 1 month for repeat CBC and vitamin B12 level check.  If neutropenia persists, we will refer to hematology. - CBC With Diff/Platelet; Future  4. Chronic pancreatitis, unspecified pancreatitis type ([place]) I  have sent the prescription for the Creon to our pharmacy to see if we have it and if not hopefully they can order it for him. Commended him on cutting back on the amount that he drinks.  Went over with him how much is too much alcoholic beverage for a male per day. - lipase/protease/amylase (CREON) 36000 UNITS CPEP  capsule; Take 2 capsules (72,000 Units total) by mouth 3 (three) times daily with meals AND 1 capsule (36,000 Units total) with snacks.  Dispense: 240 capsule; Refill: 5  5. Hepatic steatosis See #4 above. Followed by GI.  6. Common bile duct dilatation 7. Pancreatic duct dilated Being worked up by GI.  Scheduled for EUS next month.  8. Influenza vaccination declined Recommended.  Patient declined.  9. Need for shingles vaccine Second Shingrix vaccine given today.   Patient was given the opportunity to ask questions.  Patient verbalized understanding of the plan and was able to repeat key elements of the plan.   This documentation was completed using Radio producer.  Any transcriptional errors are unintentional.  Orders Placed This Encounter  Procedures   Varicella-zoster vaccine IM   CBC With Diff/Platelet   Vitamin B12     Requested Prescriptions   Signed Prescriptions Disp Refills   lipase/protease/amylase (CREON) 36000 UNITS CPEP capsule 240 capsule 5    Sig: Take 2 capsules (72,000 Units total) by mouth 3 (three) times daily with meals AND 1 capsule (36,000 Units total) with snacks.    Return in about 4 months (around 11/29/2022) for Give lab appt in 1 mth.  Karle Plumber, Joe [name], FACP

## 2022-07-30 NOTE — Patient Instructions (Signed)
Please take your vitamin B12 supplement 1000 mcg every day. Please stop downstairs in our pharmacy to get the medication called Creon for the chronic pancreatitis.  This will help decrease the diarrhea that you have been experiencing. I encourage you to continue to cut back on the amount of beer that you drink.

## 2022-07-31 ENCOUNTER — Other Ambulatory Visit: Payer: Self-pay

## 2022-07-31 DIAGNOSIS — E538 Deficiency of other specified B group vitamins: Secondary | ICD-10-CM | POA: Insufficient documentation

## 2022-07-31 DIAGNOSIS — K760 Fatty (change of) liver, not elsewhere classified: Secondary | ICD-10-CM | POA: Insufficient documentation

## 2022-08-15 ENCOUNTER — Encounter (HOSPITAL_COMMUNITY): Payer: Self-pay | Admitting: Gastroenterology

## 2022-08-15 NOTE — Progress Notes (Signed)
Attempted to obtain medical history via telephone, unable to reach at this time. HIPAA compliant voicemail message left requesting return call to pre surgical testing department. 

## 2022-08-21 ENCOUNTER — Other Ambulatory Visit: Payer: Self-pay | Admitting: Internal Medicine

## 2022-08-21 DIAGNOSIS — I10 Essential (primary) hypertension: Secondary | ICD-10-CM

## 2022-08-22 ENCOUNTER — Ambulatory Visit (HOSPITAL_COMMUNITY)
Admission: RE | Admit: 2022-08-22 | Discharge: 2022-08-22 | Disposition: A | Discharge status: Home / Self Care | Payer: Medicaid Other | Source: Ambulatory Visit | Attending: Gastroenterology | Admitting: Gastroenterology

## 2022-08-22 ENCOUNTER — Other Ambulatory Visit: Payer: Self-pay

## 2022-08-22 ENCOUNTER — Ambulatory Visit (HOSPITAL_COMMUNITY): Payer: Medicaid Other | Admitting: Certified Registered"

## 2022-08-22 ENCOUNTER — Encounter (HOSPITAL_COMMUNITY): Payer: Self-pay | Admitting: Gastroenterology

## 2022-08-22 ENCOUNTER — Encounter (HOSPITAL_COMMUNITY)
Admission: RE | Disposition: A | Discharge status: Home / Self Care | Payer: Self-pay | Source: Ambulatory Visit | Attending: Gastroenterology

## 2022-08-22 DIAGNOSIS — Z538 Procedure and treatment not carried out for other reasons: Secondary | ICD-10-CM | POA: Insufficient documentation

## 2022-08-22 DIAGNOSIS — K838 Other specified diseases of biliary tract: Secondary | ICD-10-CM

## 2022-08-22 DIAGNOSIS — K8689 Other specified diseases of pancreas: Secondary | ICD-10-CM

## 2022-08-22 SURGERY — CANCELLED PROCEDURE

## 2022-08-22 NOTE — Anesthesia Preprocedure Evaluation (Deleted)
Anesthesia Evaluation  Patient identified by MRN, date of birth, ID band Patient awake    Reviewed: Allergy & Precautions, NPO status , Patient's Chart, lab work & pertinent test results  History of Anesthesia Complications Negative for: history of anesthetic complications  Airway Mallampati: II  TM Distance: >3 FB Neck ROM: Full    Dental  (+) Teeth Intact, Dental Advisory Given   Pulmonary neg pulmonary ROS,    breath sounds clear to auscultation       Cardiovascular hypertension, Pt. on medications (-) angina(-) Past MI and (-) CHF  Rhythm:Regular     Neuro/Psych  Neuromuscular disease negative psych ROS   GI/Hepatic   Endo/Other  diabetes  Renal/GU      Musculoskeletal   Abdominal   Peds  Hematology Lab Results      Component                Value               Date                      WBC                                          06/19/2022            2.2 Repeated and verified X2. (L)      HGB                      12.9 (L)            06/19/2022                HCT                      39.7                06/19/2022                MCV                      83.5                06/19/2022                PLT                      291.0               06/19/2022              Anesthesia Other Findings Pancreatic mass/pseudocyst  Reproductive/Obstetrics                            Anesthesia Physical Anesthesia Plan  ASA: 2  Anesthesia Plan: MAC   Post-op Pain Management: Minimal or no pain anticipated   Induction: Intravenous  PONV Risk Score and Plan: 1 and Propofol infusion and Treatment may vary due to age or medical condition  Airway Management Planned: Nasal Cannula and Natural Airway  Additional Equipment: None  Intra-op Plan:   Post-operative Plan:   Informed Consent: I have reviewed the patients History and Physical, chart, labs and discussed the procedure including the  risks, benefits and alternatives for the proposed anesthesia with the  patient or authorized representative who has indicated his/her understanding and acceptance.     Dental advisory given  Plan Discussed with: CRNA  Anesthesia Plan Comments:         Anesthesia Quick Evaluation

## 2022-08-22 NOTE — OR Nursing (Signed)
Patient ate 8a, Dr Rush Landmark spoke with patient and rescheduling for Thurs, Nov 30th 930am. Reminded patient NPO after MN except sips of water with meds.

## 2022-08-22 NOTE — Discharge Instructions (Addendum)
    [facility] ENDOSCOPY [place], [place]  [address] Phone:  [phone]   Patient: Joe ROSERO Sr.  Date of Birth: Apr 21, 1961  Date of Visit: 08/22/2022   To Whom It May Concern:  Craige Patel was seen and treated on 08/22/2022 at [facility].          Sincerely,     Treatment Team:  Attending Provider: Mansouraty, Telford Nab., MD

## 2022-08-29 ENCOUNTER — Ambulatory Visit: Payer: Medicaid Other | Attending: Internal Medicine

## 2022-08-29 DIAGNOSIS — D709 Neutropenia, unspecified: Secondary | ICD-10-CM

## 2022-08-29 DIAGNOSIS — E538 Deficiency of other specified B group vitamins: Secondary | ICD-10-CM

## 2022-08-30 LAB — CBC WITH DIFF/PLATELET
Basophils Absolute: 0 10*3/uL (ref 0.0–0.2)
Basos: 0 %
EOS (ABSOLUTE): 0.2 10*3/uL (ref 0.0–0.4)
Eos: 4 %
Hematocrit: 36.4 % — ABNORMAL LOW (ref 37.5–51.0)
Hemoglobin: 12.1 g/dL — ABNORMAL LOW (ref 13.0–17.7)
Immature Grans (Abs): 0 10*3/uL (ref 0.0–0.1)
Immature Granulocytes: 0 %
Lymphocytes Absolute: 0.7 10*3/uL (ref 0.7–3.1)
Lymphs: 18 %
MCH: 27.1 pg (ref 26.6–33.0)
MCHC: 33.2 g/dL (ref 31.5–35.7)
MCV: 82 fL (ref 79–97)
Monocytes Absolute: 0.5 10*3/uL (ref 0.1–0.9)
Monocytes: 12 %
Neutrophils Absolute: 2.8 10*3/uL (ref 1.4–7.0)
Neutrophils: 66 %
Platelets: 262 10*3/uL (ref 150–450)
RBC: 4.46 x10E6/uL (ref 4.14–5.80)
RDW: 13.7 % (ref 11.6–15.4)
WBC: 4.2 10*3/uL (ref 3.4–10.8)

## 2022-08-30 LAB — VITAMIN B12: Vitamin B-12: 901 pg/mL (ref 232–1245)

## 2022-09-02 ENCOUNTER — Other Ambulatory Visit: Payer: Self-pay

## 2022-09-02 ENCOUNTER — Telehealth: Payer: Self-pay

## 2022-09-02 DIAGNOSIS — I10 Essential (primary) hypertension: Secondary | ICD-10-CM

## 2022-09-02 DIAGNOSIS — E538 Deficiency of other specified B group vitamins: Secondary | ICD-10-CM

## 2022-09-02 MED ORDER — VITAMIN B-12 1000 MCG PO TABS
1000.0000 ug | ORAL_TABLET | Freq: Every day | ORAL | 2 refills | Status: DC
  Filled 2022-09-02: qty 130, 130d supply, fill #0

## 2022-09-02 MED ORDER — AMLODIPINE BESYLATE 10 MG PO TABS
10.0000 mg | ORAL_TABLET | Freq: Every day | ORAL | 5 refills | Status: DC
  Filled 2022-09-02: qty 30, 30d supply, fill #0
  Filled 2022-11-13 – 2022-12-02 (×2): qty 30, 30d supply, fill #1

## 2022-09-02 MED ORDER — LOSARTAN POTASSIUM 50 MG PO TABS
50.0000 mg | ORAL_TABLET | Freq: Every day | ORAL | 1 refills | Status: DC
  Filled 2022-09-02: qty 90, 90d supply, fill #0
  Filled 2022-11-13: qty 90, 90d supply, fill #1

## 2022-09-02 NOTE — Telephone Encounter (Signed)
Pt is needing refills on medications.

## 2022-09-03 ENCOUNTER — Other Ambulatory Visit: Payer: Self-pay

## 2022-09-13 ENCOUNTER — Ambulatory Visit (INDEPENDENT_AMBULATORY_CARE_PROVIDER_SITE_OTHER): Payer: Medicaid Other | Admitting: Orthopaedic Surgery

## 2022-09-13 DIAGNOSIS — M12812 Other specific arthropathies, not elsewhere classified, left shoulder: Secondary | ICD-10-CM

## 2022-09-13 NOTE — Progress Notes (Signed)
Office Visit Note   Patient: Joe EVETTS Sr.           Date of Birth: 01-01-1961           MRN: [id number] Visit Date: 09/13/2022              Requested by: [name], MD [address] [place],  [place] [address] PCP: [name], MD   Assessment & Plan: Visit Diagnoses:  1. Rotator cuff arthropathy of left shoulder     Plan: Impression is left shoulder rotator cuff arthropathy.  Patient has had fantastic relief from his last cortisone injection.  We have discussed repeat injection for now as he is too young to proceed with reverse total shoulder replacement.  He is agreeable to this plan.  He will follow-up with Dr. Rolena Infante for injection.  Call with concerns or questions in the meantime.  Follow-Up Instructions: Return if symptoms worsen or fail to improve.   Orders:  No orders of the defined types were placed in this encounter.  No orders of the defined types were placed in this encounter.     Procedures: No procedures performed   Clinical Data: No additional findings.   Subjective: Chief Complaint  Patient presents with   Left Shoulder - Pain    HPI patient is a pleasant 61 year old gentleman who comes in today with recurrent left shoulder pain.  History of rotator cuff arthropathy with irreparable supraspinatus and subscap tendon tears.  He has been seen by Dr. Marlou Sa for surgical consultation which would include a reverse shoulder replacement but at the time the patient was having good relief from cortisone injection and was told to try to hold off from surgical intervention due to his young age.  The patient's cortisone injection was in July of last year which significantly helped until this past week.  His symptoms have returned.  Pain is to the entire shoulder and is worse with any movement of the shoulder.  He does get relief at rest.  He is not taking anything for pain.  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  Doing well with this.  Review of Systems as detailed in HPI.  All others reviewed and are negative.   Objective: Vital Signs: There were no vitals taken for this visit.  Physical Exam well-developed well-nourished gentleman in no acute distress.  Alert and oriented x3.  Ortho Exam left shoulder exam reveals full active range of motion in all planes.  Near full strength throughout.  He is neurovascular intact distally.  Specialty Comments:  No specialty comments available.  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 03/11/2019   Mass of pancreas 03/11/2019   Chronic pancreatitis ([place])    Microcytic anemia    Chronic left-sided low back pain with left-sided sciatica 07/08/2018   Hypertension 11/04/2014   Past Medical History:  Diagnosis Date   Diabetes mellitus without complication ([place])    Hypertension 2016   Left shoulder  pain    Mass of pancreas 03/11/2019   Microcytic anemia    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   Hypertension Mother    Hypertension Father     Past Surgical History:  Procedure Laterality Date   COLONOSCOPY WITH PROPOFOL N/A 11/08/2018   Procedure: COLONOSCOPY WITH PROPOFOL;  Surgeon: Lavena Bullion, DO;  Location: [facility];  Service: Gastroenterology;  Laterality: N/A;    ESOPHAGOGASTRODUODENOSCOPY (EGD) WITH PROPOFOL N/A 11/06/2018   Procedure: ESOPHAGOGASTRODUODENOSCOPY (EGD) WITH PROPOFOL;  Surgeon: Carol Ada, MD;  Location: [place];  Service: Endoscopy;  Laterality: N/A;   EUS  11/06/2018   Procedure: FULL UPPER ENDOSCOPIC ULTRASOUND (EUS) RADIAL;  Surgeon: Carol Ada, MD;  Location: [place] [facility];  Service: Endoscopy;;   GIVENS CAPSULE STUDY N/A 11/08/2018   Procedure: GIVENS CAPSULE STUDY;  Surgeon: Lavena Bullion, DO;  Location: [facility];  Service: Gastroenterology;  Laterality: N/A;   IR ANGIOGRAM SELECTIVE EACH ADDITIONAL VESSEL  11/10/2018   IR ANGIOGRAM VISCERAL SELECTIVE  11/10/2018   IR ANGIOGRAM VISCERAL SELECTIVE  11/10/2018   IR EMBO ART  VEN HEMORR LYMPH EXTRAV  INC GUIDE ROADMAPPING  11/10/2018   IR US GUIDE VASC ACCESS RIGHT  11/10/2018   KNEE SURGERY Bilateral 1985   Arthroscopic for torn cartilage and ligaments.     SHOULDER OPEN ROTATOR CUFF REPAIR Left 05/10/2020   Procedure: LEFT SHOULDER OPEN ROTATOR CUFF REPAIR AND BICEPS TENODESIS;  Surgeon: Leandrew Koyanagi, MD;  Location: [facility];  Service: Orthopedics;  Laterality: Left;   Social History   Occupational History   Occupation: Packing/stacking/loading    Comment: soaps, shaving implements, etc.  Tobacco Use   Smoking status: Never   Smokeless tobacco: Never  Vaping Use   Vaping Use: Never used  Substance and Sexual Activity   Alcohol use: Yes    Comment: 6 pack of beer daily   Drug use: No   Sexual activity: Yes

## 2022-09-16 ENCOUNTER — Ambulatory Visit: Payer: Self-pay

## 2022-09-16 ENCOUNTER — Encounter: Payer: Self-pay | Admitting: Sports Medicine

## 2022-09-16 ENCOUNTER — Ambulatory Visit (INDEPENDENT_AMBULATORY_CARE_PROVIDER_SITE_OTHER): Payer: Medicaid Other | Admitting: Sports Medicine

## 2022-09-16 DIAGNOSIS — G8929 Other chronic pain: Secondary | ICD-10-CM | POA: Diagnosis not present

## 2022-09-16 DIAGNOSIS — M12812 Other specific arthropathies, not elsewhere classified, left shoulder: Secondary | ICD-10-CM | POA: Diagnosis not present

## 2022-09-16 DIAGNOSIS — M25512 Pain in left shoulder: Secondary | ICD-10-CM | POA: Diagnosis not present

## 2022-09-16 MED ORDER — BUPIVACAINE HCL 0.25 % IJ SOLN
2.0000 mL | INTRAMUSCULAR | Status: AC | PRN
  Administered 2022-09-16: 2 mL via INTRA_ARTICULAR

## 2022-09-16 MED ORDER — LIDOCAINE HCL 1 % IJ SOLN
2.0000 mL | INTRAMUSCULAR | Status: AC | PRN
  Administered 2022-09-16: 2 mL

## 2022-09-16 MED ORDER — METHYLPREDNISOLONE ACETATE 40 MG/ML IJ SUSP
80.0000 mg | INTRAMUSCULAR | Status: AC | PRN
  Administered 2022-09-16: 80 mg via INTRA_ARTICULAR

## 2022-09-16 NOTE — Progress Notes (Signed)
   Procedure Note  Patient: Joe CHARTERS Sr.             Date of Birth: 08/08/1961           MRN: [id number]             Visit Date: 09/16/2022  Procedures: Visit Diagnoses:  1. Rotator cuff arthropathy of left shoulder    Large Joint Inj: L glenohumeral on 09/16/2022 3:11 PM Indications: pain Details: 22 G 3.5 in needle, ultrasound-guided posterior approach Medications: 2 mL lidocaine 1 %; 2 mL bupivacaine 0.25 %; 80 mg methylPREDNISolone acetate 40 MG/ML Outcome: tolerated well, no immediate complications  US-guided glenohumeral joint injection, left shoulder After discussion on risks/benefits/indications, informed verbal consent was obtained. A timeout was then performed. The patient was positioned lying lateral recumbent on examination table. The patient's shoulder was prepped with betadine and multiple alcohol swabs and utilizing ultrasound guidance, the patient's glenohumeral joint was identified on ultrasound. Using ultrasound guidance a 22-gauge, 3.5 inch needle with a mixture of 2:2:2 cc's lidocaine:bupivicaine:depomedrol was directed from a lateral to medial direction via in-plane technique into the glenohumeral joint with visualization of appropriate spread of injectate into the joint. Patient tolerated the procedure well without immediate complications.      Procedure, treatment alternatives, risks and benefits explained, specific risks discussed. Consent was given by the patient. Immediately prior to procedure a time out was called to verify the correct patient, procedure, equipment, support staff and site/side marked as required. Patient was prepped and draped in the usual sterile fashion.     - I evaluated the patient about 10 minutes post-injection and he had improvement in pain and range of motion - follow-up with Dr. Erlinda Hong as indicated; I am happy to see them as needed  Joe Barman, DO [place]  This  note was dictated using Dragon naturally speaking software and may contain errors in syntax, spelling, or content which have not been identified prior to signing this note.

## 2022-09-24 ENCOUNTER — Encounter (HOSPITAL_COMMUNITY): Payer: Self-pay | Admitting: Gastroenterology

## 2022-09-24 NOTE — Progress Notes (Signed)
Attempted to obtain medical history via telephone, unable to reach at this time. HIPAA compliant voicemail message left requesting return call to pre surgical testing department. 

## 2022-10-03 ENCOUNTER — Ambulatory Visit (HOSPITAL_BASED_OUTPATIENT_CLINIC_OR_DEPARTMENT_OTHER): Payer: Medicaid Other | Admitting: Certified Registered Nurse Anesthetist

## 2022-10-03 ENCOUNTER — Encounter (HOSPITAL_COMMUNITY)
Admission: RE | Disposition: A | Discharge status: Home / Self Care | Payer: Self-pay | Source: Home / Self Care | Attending: Gastroenterology

## 2022-10-03 ENCOUNTER — Ambulatory Visit (HOSPITAL_COMMUNITY)
Admission: RE | Admit: 2022-10-03 | Discharge: 2022-10-03 | Disposition: A | Discharge status: Home / Self Care | Payer: Medicaid Other | Attending: Gastroenterology | Admitting: Gastroenterology

## 2022-10-03 ENCOUNTER — Ambulatory Visit (HOSPITAL_COMMUNITY): Payer: Medicaid Other | Admitting: Certified Registered Nurse Anesthetist

## 2022-10-03 ENCOUNTER — Encounter (HOSPITAL_COMMUNITY): Payer: Self-pay | Admitting: Gastroenterology

## 2022-10-03 ENCOUNTER — Other Ambulatory Visit: Payer: Self-pay

## 2022-10-03 DIAGNOSIS — Z79899 Other long term (current) drug therapy: Secondary | ICD-10-CM | POA: Diagnosis not present

## 2022-10-03 DIAGNOSIS — K838 Other specified diseases of biliary tract: Secondary | ICD-10-CM | POA: Diagnosis present

## 2022-10-03 DIAGNOSIS — I899 Noninfective disorder of lymphatic vessels and lymph nodes, unspecified: Secondary | ICD-10-CM | POA: Diagnosis not present

## 2022-10-03 DIAGNOSIS — K8689 Other specified diseases of pancreas: Secondary | ICD-10-CM | POA: Insufficient documentation

## 2022-10-03 DIAGNOSIS — K869 Disease of pancreas, unspecified: Secondary | ICD-10-CM | POA: Diagnosis not present

## 2022-10-03 DIAGNOSIS — K3189 Other diseases of stomach and duodenum: Secondary | ICD-10-CM | POA: Insufficient documentation

## 2022-10-03 DIAGNOSIS — B9681 Helicobacter pylori [H. pylori] as the cause of diseases classified elsewhere: Secondary | ICD-10-CM | POA: Diagnosis not present

## 2022-10-03 DIAGNOSIS — K2289 Other specified disease of esophagus: Secondary | ICD-10-CM

## 2022-10-03 DIAGNOSIS — I10 Essential (primary) hypertension: Secondary | ICD-10-CM

## 2022-10-03 DIAGNOSIS — K828 Other specified diseases of gallbladder: Secondary | ICD-10-CM | POA: Insufficient documentation

## 2022-10-03 DIAGNOSIS — E119 Type 2 diabetes mellitus without complications: Secondary | ICD-10-CM | POA: Insufficient documentation

## 2022-10-03 DIAGNOSIS — R933 Abnormal findings on diagnostic imaging of other parts of digestive tract: Secondary | ICD-10-CM | POA: Insufficient documentation

## 2022-10-03 HISTORY — PX: EUS: SHX5427

## 2022-10-03 HISTORY — PX: ESOPHAGOGASTRODUODENOSCOPY (EGD) WITH PROPOFOL: SHX5813

## 2022-10-03 HISTORY — PX: BIOPSY: SHX5522

## 2022-10-03 SURGERY — UPPER ENDOSCOPIC ULTRASOUND (EUS) RADIAL
Anesthesia: Monitor Anesthesia Care

## 2022-10-03 MED ORDER — GLYCOPYRROLATE PF 0.2 MG/ML IJ SOSY
PREFILLED_SYRINGE | INTRAMUSCULAR | Status: DC | PRN
  Administered 2022-10-03: .2 mg via INTRAVENOUS

## 2022-10-03 MED ORDER — PHENYLEPHRINE 80 MCG/ML (10ML) SYRINGE FOR IV PUSH (FOR BLOOD PRESSURE SUPPORT)
PREFILLED_SYRINGE | INTRAVENOUS | Status: DC | PRN
  Administered 2022-10-03: 80 ug via INTRAVENOUS
  Administered 2022-10-03: 40 ug via INTRAVENOUS
  Administered 2022-10-03 (×3): 80 ug via INTRAVENOUS

## 2022-10-03 MED ORDER — ONDANSETRON HCL 4 MG/2ML IJ SOLN
INTRAMUSCULAR | Status: DC | PRN
  Administered 2022-10-03: 4 mg via INTRAVENOUS

## 2022-10-03 MED ORDER — PROPOFOL 500 MG/50ML IV EMUL
INTRAVENOUS | Status: DC | PRN
  Administered 2022-10-03: 125 ug/kg/min via INTRAVENOUS

## 2022-10-03 MED ORDER — LIDOCAINE 2% (20 MG/ML) 5 ML SYRINGE
INTRAMUSCULAR | Status: DC | PRN
  Administered 2022-10-03: 80 mg via INTRAVENOUS

## 2022-10-03 MED ORDER — DEXMEDETOMIDINE HCL IN NACL 80 MCG/20ML IV SOLN
INTRAVENOUS | Status: DC | PRN
  Administered 2022-10-03 (×3): 4 ug via BUCCAL

## 2022-10-03 MED ORDER — SODIUM CHLORIDE 0.9 % IV SOLN: INTRAVENOUS | Status: DC

## 2022-10-03 MED ORDER — OMEPRAZOLE MAGNESIUM 20 MG PO TBEC: 20.0000 mg | DELAYED_RELEASE_TABLET | Freq: Every day | ORAL | 6 refills | Status: DC

## 2022-10-03 MED ORDER — PROPOFOL 10 MG/ML IV BOLUS
INTRAVENOUS | Status: DC | PRN
  Administered 2022-10-03: 10 mg via INTRAVENOUS
  Administered 2022-10-03 (×2): 20 mg via INTRAVENOUS

## 2022-10-03 NOTE — Anesthesia Postprocedure Evaluation (Signed)
Anesthesia Post Note  Patient: Joe Battaglini Blystone Sr.  Procedure(s) Performed: UPPER ENDOSCOPIC ULTRASOUND (EUS) RADIAL BIOPSY     Patient location during evaluation: Endoscopy Anesthesia Type: MAC Level of consciousness: awake and alert Pain management: pain level controlled Vital Signs Assessment: post-procedure vital signs reviewed and stable Respiratory status: spontaneous breathing, nonlabored ventilation, respiratory function stable and patient connected to nasal cannula oxygen Cardiovascular status: blood pressure returned to baseline and stable Postop Assessment: no apparent nausea or vomiting Anesthetic complications: no  No notable events documented.  Last Vitals:  Vitals:   10/03/22 1110 10/03/22 1120  BP: 117/79 119/85  Pulse: 71 63  Resp: 15 11  Temp:    SpO2: 99% 100%    Last Pain:  Vitals:   10/03/22 1120  TempSrc:   PainSc: 0-No pain                 Chalese Peach L Sanjith Siwek

## 2022-10-03 NOTE — Transfer of Care (Signed)
Immediate Anesthesia Transfer of Care Note  Patient: Joe Fyock Goth Sr.  Procedure(s) Performed: UPPER ENDOSCOPIC ULTRASOUND (EUS) RADIAL BIOPSY  Patient Location: Endoscopy Unit  Anesthesia Type:MAC  Level of Consciousness: drowsy  Airway & Oxygen Therapy: Patient Spontanous Breathing and Patient connected to face mask oxygen  Post-op Assessment: Report given to RN and Post -op Vital signs reviewed and stable  Post vital signs: Reviewed and stable  Last Vitals:  Vitals Value Taken Time  BP    Temp    Pulse    Resp    SpO2      Last Pain:  Vitals:   10/03/22 0847  TempSrc: Temporal  PainSc: 0-No pain         Complications: No notable events documented.

## 2022-10-03 NOTE — Op Note (Signed)
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 Estimated Blood Loss:     Estimated blood loss was minimal. Procedure:                Pre-Anesthesia Assessment:                           - Prior to the procedure, a History and Physical                            was performed, and patient medications and                            allergies were reviewed. The patient's tolerance of                            previous anesthesia was also reviewed. The risks                            and benefits of the procedure and the sedation                            options and risks were discussed with the patient.                            All questions were answered, and informed consent                            was obtained. Prior Anticoagulants: The patient has                            taken no anticoagulant or antiplatelet agents. ASA  Grade Assessment: III - A patient with severe                            systemic disease. After reviewing the risks and                            benefits, the patient was deemed in satisfactory                            condition to undergo the procedure.                           After obtaining informed consent, the endoscope was                             passed under direct vision. Throughout the                            procedure, the patient's blood pressure, pulse, and                            oxygen saturations were monitored continuously. The                            [id number]-[id number] ([id number]) Olympus endoscope was introduced                            through the mouth, and advanced to the second part                            of duodenum. The [id number]-[id number] ([id number]) Olympus                            duodenoscope was introduced through the mouth, and                            advanced to the area of papilla. The [id number]                            ([id number]) Olympus linear ultrasound scope was                            introduced through the mouth, and advanced to the                            duodenum for ultrasound examination from the                            stomach and duodenum. The upper EUS was                            accomplished without difficulty. The patient                              tolerated the procedure. Scope In: Scope Out: Findings:      ENDOSCOPIC FINDING: :      No gross lesions were noted in the proximal esophagus and in the mid       esophagus.      Scattered islands of salmon-colored mucosa were present from 38 to 40       cm. No other visible abnormalities were present. Biopsies were taken       with a cold forceps for histology.      Patchy mildly erythematous mucosa without bleeding was found in the       entire examined stomach. Biopsies were taken with a cold forceps for       histology and Helicobacter pylori testing.      No gross lesions were noted in the duodenal bulb, in the first portion       of the duodenum and in the second portion of the duodenum.      The major papilla and minor papilla were normal in endoscopic appearance.      ENDOSONOGRAPHIC FINDING: :      Pancreatic parenchymal abnormalities were noted in the entire pancreas.       These consisted of atrophy,  lobularity without honeycombing and       hyperechoic strands.      The pancreatic duct had a dilated endosonographic appearance, had       appearance of an intraductal stone and had a strictured endosonographic       appearance. The duct within the pancreatic head measured between 4.6 mm       to 6.4 mm, genu of the pancreas up to 9.2 mm, body of the pancreas       between 3.9 mm to 6.4 mm and tail of the pancreas up to 7.5 mm. There       appeared to be a pancreatic stricture in the body which likely is       resulting in the duct dilation noted. Overt masslike areas were not       present. There appears to be in this region artifact and I am not sure       if this is from prior coils in the area. Within the pancreatic head,       there appears to be a stone that is in the region of duct dilation.      There was dilation in the common bile duct (6.6 mm) and in the common       hepatic duct (11.9 mm). The gallbladder is distended with gallbladder       sludge but no stones were noted in any area of the bile duct system.      Endosonographic imaging of the ampulla showed no intramural       (subepithelial) lesion.      Endosonographic imaging in the visualized portion of the liver showed no       mass.      No malignant-appearing lymph nodes were visualized in the celiac region       (level 20), peripancreatic region and porta hepatis region.      The celiac region was visualized. Impression:               EGD impression:                           - No gross lesions in the proximal esophagus and in  the mid esophagus.                           - Salmon-colored mucosal islands suspicious for                            short-segment Barrett's esophagus. Biopsied.                           - Erythematous mucosa in the stomach. Biopsied.                           - No gross lesions in the duodenal bulb, in the                            first portion of the duodenum  and in the second                            portion of the duodenum.                           - Normal major papilla and minor papilla.                           EUS impression:                           - Pancreatic parenchymal abnormalities consisting                            of atrophy, lobularity and hyperechoic strands were                            noted in the entire pancreas.                           - The pancreatic duct had a dilated endosonographic                            appearance, had concern for an intraductal stone in                            the head of pancreas and had a strictured                            endosonographic appearance within the body of the                            pancreas. Duct dilation noted throughout (see                            above).                           - There was dilation in the common bile duct and in  the common hepatic duct. There is gallbladder                            sludge but no evidence of stone disease within the                            biliary system                           - No malignant-appearing lymph nodes were                            visualized in the celiac region (level 20),                            peripancreatic region and porta hepatis region. Moderate Sedation:      Not Applicable - Patient had care per Anesthesia. Recommendation:           - The patient will be observed post-procedure,                            until all discharge criteria are met.                           - Discharge patient to home.                           - Patient has a contact number available for                            emergencies. The signs and symptoms of potential                            delayed complications were discussed with the                            patient. Return to normal activities tomorrow.                            Written discharge instructions were provided  to the                            patient.                           - Observe patient's clinical course.                           - Omeprazole 20 mg daily to be initiated.                           -Continue PERT therapy.                           - Continue present medications.                           -  If patient remains completely abstinent of                            alcohol and work to continue to have issues                            concerning for recurrent pancreatitis or severe                            abdominal pain episodes, query the possibility of                            attempted pancreatic ERCP to traverse the PD stone                            in the head of pancreas and also evaluate and see                            if we can traverse the body of pancreas stricture                            that is present.                           - Recommend repeat MRI/MRCP in 1 year otherwise.                           - The findings and recommendations were discussed                            with the patient.                           - The findings and recommendations were discussed                            with the patient's family. Procedure Code(s):        --- Professional ---                           43237, Esophagogastroduodenoscopy, flexible,                            transoral; with endoscopic ultrasound examination                            limited to the esophagus, stomach or duodenum, and                            adjacent structures                           43239, Esophagogastroduodenoscopy, flexible,                              transoral; with biopsy, single or multiple Diagnosis Code(s):        --- Professional ---                           K22.89, Other specified disease of esophagus                           K31.89, Other diseases of stomach and duodenum                           K86.9, Disease of pancreas, unspecified                            K86.89, Other specified diseases of pancreas                           R93.3, Abnormal findings on diagnostic imaging of                            other parts of digestive tract                           K83.8, Other specified diseases of biliary tract                           I89.9, Noninfective disorder of lymphatic vessels                            and lymph nodes, unspecified CPT copyright 2022 American Medical Association. All rights reserved. The codes documented in this report are preliminary and upon coder review may  be revised to meet current compliance requirements. Gabriel Mansouraty, MD 10/03/2022 11:06:34 AM Number of Addenda: 0 

## 2022-10-03 NOTE — H&P (Signed)
GASTROENTEROLOGY PROCEDURE H&P NOTE   Primary Care Physician: [name], MD  HPI: Joe Durand Brasington Sr. is a 61 y.o. 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   Past Surgical History:  Procedure Laterality Date   COLONOSCOPY WITH PROPOFOL N/A 11/08/2018   Procedure: COLONOSCOPY WITH PROPOFOL;  Surgeon: Lavena Bullion, DO;  Location: [place];  Service: Gastroenterology;  Laterality: N/A;   ESOPHAGOGASTRODUODENOSCOPY (EGD) WITH PROPOFOL N/A 11/06/2018   Procedure: ESOPHAGOGASTRODUODENOSCOPY (EGD) WITH PROPOFOL;  Surgeon: Carol Ada, MD;  Location: [place];  Service: Endoscopy;  Laterality: N/A;   EUS  11/06/2018   Procedure: FULL UPPER ENDOSCOPIC ULTRASOUND (EUS) RADIAL;  Surgeon: Carol Ada, MD;  Location: [place];  Service: Endoscopy;;   GIVENS CAPSULE STUDY N/A 11/08/2018   Procedure: GIVENS CAPSULE STUDY;  Surgeon: Lavena Bullion, DO;  Location: [place];  Service: Gastroenterology;  Laterality: N/A;   IR ANGIOGRAM SELECTIVE EACH ADDITIONAL VESSEL  11/10/2018   IR ANGIOGRAM VISCERAL SELECTIVE  11/10/2018   IR ANGIOGRAM VISCERAL SELECTIVE  11/10/2018   IR EMBO ART  VEN HEMORR LYMPH EXTRAV  INC GUIDE ROADMAPPING  11/10/2018   IR US GUIDE VASC ACCESS RIGHT  11/10/2018   KNEE SURGERY Bilateral 1985   Arthroscopic for torn cartilage and ligaments.     SHOULDER OPEN ROTATOR CUFF REPAIR Left 05/10/2020   Procedure: LEFT SHOULDER OPEN ROTATOR CUFF REPAIR AND BICEPS TENODESIS;  Surgeon: Leandrew Koyanagi, MD;  Location: [place];  Service: Orthopedics;   Laterality: Left;   No current facility-administered medications for this encounter.   No current facility-administered medications for this encounter. Allergies  Allergen Reactions   Hctz [Hydrochlorothiazide] Rash   Family History  Problem Relation Age of Onset   Cancer Mother        uncertain what was wrong--mother would not share, but gradual decline.   Hypertension Mother    Hypertension Father    Social History   Socioeconomic History   Marital status: Soil scientist    Spouse name: [name]   Number of children: 2   Years of education: 12   Highest education level: 12th grade  Occupational History   Occupation: Packing/stacking/loading    Comment: soaps, shaving implements, etc.  Tobacco Use   Smoking status: Never   Smokeless tobacco: Never  Vaping Use   Vaping Use: Never used  Substance and Sexual Activity   Alcohol use: Yes    Comment: 6 pack of beer daily   Drug use: No   Sexual activity: Yes  Other Topics Concern   Not on file  Social History Narrative   Works second shift at [employer] with his partner of 30+ years and adult daughter [name].   40 oz beer/day   No drug   Never smoker      Social Determinants of Radio broadcast assistant Strain: Not on file  Food Insecurity: Not on file  Transportation Needs: Not on file  Physical Activity: Not on file  Stress: Not on file  Social Connections: Not on file  Intimate Partner  Violence: Not At Risk (02/18/2018)   Humiliation, Afraid, Rape, and Kick questionnaire    Fear of Current or Ex-Partner: No    Emotionally Abused: No    Physically Abused: No    Sexually Abused: No    Physical Exam: Today's Vitals   09/24/22 1438  Weight: 69.9 kg   Body mass index is 22.1 kg/m. GEN: NAD EYE: Sclerae anicteric ENT: MMM CV: Non-tachycardic GI: Soft, NT/ND NEURO:  Alert & Oriented x 3  Lab Results: No results for input(s): "WBC", "HGB", "HCT", "PLT" in the last 72  hours. BMET No results for input(s): "NA", "K", "CL", "CO2", "GLUCOSE", "BUN", "CREATININE", "CALCIUM" in the last 72 hours. LFT No results for input(s): "PROT", "ALBUMIN", "AST", "ALT", "ALKPHOS", "BILITOT", "BILIDIR", "IBILI" in the last 72 hours. PT/INR No results for input(s): "LABPROT", "INR" in the last 72 hours.   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  The risks of an EUS including intestinal perforation, bleeding, infection, aspiration, and medication effects were discussed as was the possibility it may not give a definitive diagnosis if a biopsy is performed.  When a biopsy of the pancreas is done as part of the EUS, there is an additional risk of pancreatitis at the rate of about 1-2%.  It was explained that procedure related pancreatitis is typically mild, although it can be severe and even life threatening, which is why we do not perform random pancreatic biopsies and only biopsy a lesion/area we feel is concerning enough to warrant the risk.   The risks and benefits of endoscopic evaluation/treatment were discussed with the patient and/or family; these include but are not limited to the risk of perforation, infection, bleeding, missed lesions, lack of diagnosis, severe illness requiring hospitalization, as well as anesthesia and sedation related illnesses.  The patient's history has been reviewed, patient examined, no change in status, and deemed stable for procedure.  The patient and/or family is agreeable to proceed.    Justice Britain, MD [facility] Office # [phone]

## 2022-10-03 NOTE — Anesthesia Procedure Notes (Addendum)
Procedure Name: MAC Date/Time: 10/03/2022 10:00 AM  Performed by: West Pugh, CRNAPre-anesthesia Checklist: Patient identified, Emergency Drugs available, Suction available, Patient being monitored and Timeout performed Patient Re-evaluated:Patient Re-evaluated prior to induction Oxygen Delivery Method: Simple face mask Preoxygenation: Pre-oxygenation with 100% oxygen Placement Confirmation: positive ETCO2 Comments: pom

## 2022-10-03 NOTE — Discharge Instructions (Addendum)
YOU HAD AN ENDOSCOPIC PROCEDURE TODAY: Refer to the procedure report and other information in the discharge instructions given to you for any specific questions about what was found during the examination. If this information does not answer your questions, please call [place] office at [phone] to clarify.   YOU SHOULD EXPECT: Some feelings of bloating in the abdomen. Passage of more gas than usual. Walking can help get rid of the air that was put into your GI tract during the procedure and reduce the bloating. If you had a lower endoscopy (such as a colonoscopy or flexible sigmoidoscopy) you may notice spotting of blood in your stool or on the toilet paper. Some abdominal soreness may be present for a day or two, also.  DIET: Your first meal following the procedure should be a light meal and then it is ok to progress to your normal diet. A half-sandwich or bowl of soup is an example of a good first meal. Heavy or fried foods are harder to digest and may make you feel nauseous or bloated. Drink plenty of fluids but you should avoid alcoholic beverages for 24 hours. If you had a esophageal dilation, please see attached instructions for diet.    ACTIVITY: Your care partner should take you home directly after the procedure. You should plan to take it easy, moving slowly for the rest of the day. You can resume normal activity the day after the procedure however YOU SHOULD NOT DRIVE, use power tools, machinery or perform tasks that involve climbing or major physical exertion for 24 hours (because of the sedation medicines used during the test).   SYMPTOMS TO REPORT IMMEDIATELY: A gastroenterologist can be reached at any hour. Please call [phone]  for any of the following symptoms:  Following lower endoscopy (colonoscopy, flexible sigmoidoscopy) Excessive amounts of blood in the stool  Significant tenderness, worsening of abdominal pains  Swelling of the abdomen that is new, acute  Fever of 100 or  higher  Following upper endoscopy (EGD, EUS, ERCP, esophageal dilation) Vomiting of blood or coffee ground material  New, significant abdominal pain  New, significant chest pain or pain under the shoulder blades  Painful or persistently difficult swallowing  New shortness of breath  Black, tarry-looking or red, bloody stools  FOLLOW UP:  If any biopsies were taken you will be contacted by phone or by letter within the next 1-3 weeks. Call [phone]  if you have not heard about the biopsies in 3 weeks.  Please also call with any specific questions about appointments or follow up tests.                     [facility] ENDOSCOPY [place],   [id number] Phone:  [phone]   October 03, 2022  Patient: Joe [name] [facility]  Date of Birth: 08/30/1961  Date of Visit: October 03, 2022    To Whom It May Concern:  Joe [name] was seen and treated on October 03, 2022 and may return to work on December 1st.     .           If you have any questions or concerns, please don't hesitate to call.   Sincerely,    [facility]    Treatment Team:  Attending Provider: Mansouraty, Telford Nab., MD

## 2022-10-03 NOTE — Anesthesia Preprocedure Evaluation (Addendum)
Anesthesia Evaluation  Patient identified by MRN, date of birth, ID band Patient awake    Reviewed: Allergy & Precautions, NPO status , Patient's Chart, lab work & pertinent test results  Airway Mallampati: II  TM Distance: >3 FB Neck ROM: Full    Dental  (+) Poor Dentition, Dental Advisory Given, Chipped, Missing   Pulmonary neg pulmonary ROS   Pulmonary exam normal breath sounds clear to auscultation       Cardiovascular hypertension, Pt. 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  Reproductive/Obstetrics                             Anesthesia Physical Anesthesia Plan  ASA: 2  Anesthesia Plan: MAC   Post-op Pain Management:    Induction: Intravenous  PONV Risk Score and Plan: Propofol infusion and Treatment may vary due to age or medical condition  Airway Management Planned: Natural Airway  Additional Equipment:   Intra-op Plan:   Post-operative Plan:   Informed Consent: I have reviewed the patients History and Physical, chart, labs and discussed the procedure including the risks, benefits and alternatives for the proposed anesthesia with the patient or authorized representative who has indicated his/her understanding and acceptance.     Dental advisory given  Plan Discussed with: CRNA  Anesthesia Plan Comments:        Anesthesia Quick  Evaluation

## 2022-10-04 ENCOUNTER — Encounter (HOSPITAL_COMMUNITY): Payer: Self-pay | Admitting: Gastroenterology

## 2022-10-04 LAB — SURGICAL PATHOLOGY

## 2022-10-09 ENCOUNTER — Other Ambulatory Visit: Payer: Self-pay

## 2022-10-09 ENCOUNTER — Other Ambulatory Visit: Payer: Self-pay | Admitting: Internal Medicine

## 2022-10-09 MED ORDER — OMEPRAZOLE 20 MG PO CPDR
20.0000 mg | DELAYED_RELEASE_CAPSULE | Freq: Every day | ORAL | 0 refills | Status: DC
  Filled 2022-10-09: qty 28, 28d supply, fill #0

## 2022-10-09 MED ORDER — CLARITHROMYCIN 500 MG PO TABS
500.0000 mg | ORAL_TABLET | Freq: Two times a day (BID) | ORAL | 0 refills | Status: DC
  Filled 2022-10-09: qty 14, 7d supply, fill #0

## 2022-10-09 MED ORDER — AMOXICILLIN 500 MG PO CAPS
1000.0000 mg | ORAL_CAPSULE | Freq: Two times a day (BID) | ORAL | 0 refills | Status: AC
  Filled 2022-10-09: qty 56, 14d supply, fill #0

## 2022-10-10 ENCOUNTER — Encounter: Payer: Self-pay | Admitting: Gastroenterology

## 2022-10-11 ENCOUNTER — Ambulatory Visit: Payer: Self-pay

## 2022-10-11 NOTE — Telephone Encounter (Signed)
Reason for Disposition . Caller has medicine question only, adult not sick, AND triager answers question  Answer Assessment - Initial Assessment Questions 1. NAME of MEDICINE: "What medicine(s) are you calling about?"     The dr. Rockey Situ me to stop taking one of my medicines but I don't remember which one it was He wasn't to stop any of the medications but he is to take the B12 tablets and Creon. 2. QUESTION: "What is your question?" (e.g., double dose of medicine, side effect)     Can you tell me?  From his last OV he is to take the B12 tablets daily and the Creon for his digestion.   He mentioned it was the Creon he wasn't sure if he was supposed to be taking.    I let him know yes he was supposed to be taking that.   If the pharmacy at [facility] doesn't have it they can order it for you but yes he needs to be taking it. I reviewed his 2 BP medications the amlodipine and Cozaar which he is taking.   3. PRESCRIBER: "Who prescribed the medicine?" Reason: if prescribed by specialist, call should be referred to that group.     Dr. Wynetta Emery 4. SYMPTOMS: "Do you have any symptoms?" If Yes, ask: "What symptoms are you having?"  "How bad are the symptoms (e.g., mild, moderate, severe)     N/A 5. PREGNANCY:  "Is there any chance that you are pregnant?" "When was your last menstrual period?"     N/A  Protocols used: Medication Question Call-A-AH

## 2022-10-11 NOTE — Telephone Encounter (Signed)
Patient called, left VM to return the call to the office to discuss medication with a nurse.  Summary: Medication question   Pt has a medication question regarding which one his PCP told him to stop taking. Please advise

## 2022-10-21 SURGERY — UPPER ENDOSCOPIC ULTRASOUND (EUS) RADIAL
Anesthesia: Monitor Anesthesia Care

## 2022-11-13 ENCOUNTER — Other Ambulatory Visit: Payer: Self-pay | Admitting: Internal Medicine

## 2022-11-13 ENCOUNTER — Other Ambulatory Visit: Payer: Self-pay

## 2022-11-13 NOTE — Telephone Encounter (Signed)
Requested medication (s) are due for refill today: no  Requested medication (s) are on the active medication list: yes  Last refill:  10/09/22 #14   Future visit scheduled: yes  Notes to clinic:  med not assigned to a protocol   Requested Prescriptions  Pending Prescriptions Disp Refills   clarithromycin (BIAXIN) 500 MG tablet 14 tablet 0    Sig: Take 1 tablet (500 mg total) by mouth 2 (two) times daily.     Off-Protocol Failed - 11/13/2022  9:04 AM      Failed - Medication not assigned to a protocol, review manually.      Passed - Valid encounter within last 12 months    Recent Outpatient Visits           3 months ago Essential hypertension   [place], MD   7 months ago Essential hypertension   [place], MD   8 months ago Essential hypertension   [place], Jarome Matin, RPH-CPP   10 months ago Essential hypertension   [place], Stephen L, RPH-CPP   11 months ago Essential hypertension   [place], RPH-CPP       Future Appointments             In 2 weeks [name], MD [place]

## 2022-11-18 ENCOUNTER — Other Ambulatory Visit: Payer: Self-pay

## 2022-11-20 ENCOUNTER — Other Ambulatory Visit: Payer: Self-pay

## 2022-11-22 ENCOUNTER — Telehealth: Payer: Self-pay

## 2022-11-22 NOTE — Telephone Encounter (Signed)
Left message on machine to call back  

## 2022-11-22 NOTE — Telephone Encounter (Signed)
-----  Message from Timothy Lasso, RN sent at 10/11/2022 10:56 AM EST ----- 6 weeks after completion, come in for [facility] stool H. pylori testing.

## 2022-11-25 NOTE — Telephone Encounter (Signed)
No answer no voice mail  

## 2022-11-26 NOTE — Telephone Encounter (Signed)
No answer no voice mail  letter called

## 2022-12-02 ENCOUNTER — Other Ambulatory Visit: Payer: Self-pay

## 2022-12-02 ENCOUNTER — Ambulatory Visit: Payer: Self-pay | Admitting: Internal Medicine

## 2022-12-05 ENCOUNTER — Ambulatory Visit: Payer: Medicaid Other | Admitting: Internal Medicine

## 2022-12-09 ENCOUNTER — Other Ambulatory Visit: Payer: Self-pay

## 2023-01-03 ENCOUNTER — Other Ambulatory Visit (INDEPENDENT_AMBULATORY_CARE_PROVIDER_SITE_OTHER): Payer: Medicaid Other

## 2023-01-03 ENCOUNTER — Encounter: Payer: Self-pay | Admitting: Internal Medicine

## 2023-01-03 ENCOUNTER — Ambulatory Visit (INDEPENDENT_AMBULATORY_CARE_PROVIDER_SITE_OTHER): Payer: Medicaid Other | Admitting: Internal Medicine

## 2023-01-03 VITALS — BP 134/80 | HR 79 | Ht 70.0 in | Wt 151.2 lb

## 2023-01-03 DIAGNOSIS — K760 Fatty (change of) liver, not elsewhere classified: Secondary | ICD-10-CM | POA: Diagnosis not present

## 2023-01-03 DIAGNOSIS — K2970 Gastritis, unspecified, without bleeding: Secondary | ICD-10-CM | POA: Diagnosis not present

## 2023-01-03 DIAGNOSIS — B9681 Helicobacter pylori [H. pylori] as the cause of diseases classified elsewhere: Secondary | ICD-10-CM

## 2023-01-03 DIAGNOSIS — K861 Other chronic pancreatitis: Secondary | ICD-10-CM | POA: Diagnosis not present

## 2023-01-03 DIAGNOSIS — K860 Alcohol-induced chronic pancreatitis: Secondary | ICD-10-CM | POA: Diagnosis not present

## 2023-01-03 DIAGNOSIS — Z1211 Encounter for screening for malignant neoplasm of colon: Secondary | ICD-10-CM

## 2023-01-03 LAB — COMPREHENSIVE METABOLIC PANEL
ALT: 37 U/L (ref 0–53)
AST: 40 U/L — ABNORMAL HIGH (ref 0–37)
Albumin: 4.2 g/dL (ref 3.5–5.2)
Alkaline Phosphatase: 88 U/L (ref 39–117)
BUN: 15 mg/dL (ref 6–23)
CO2: 24 mEq/L (ref 19–32)
Calcium: 9.3 mg/dL (ref 8.4–10.5)
Chloride: 110 mEq/L (ref 96–112)
Creatinine, Ser: 0.94 mg/dL (ref 0.40–1.50)
GFR: 87.5 mL/min (ref >60.00)
Glucose, Bld: 85 mg/dL (ref 70–99)
Potassium: 4.2 mEq/L (ref 3.5–5.1)
Sodium: 142 mEq/L (ref 135–145)
Total Bilirubin: 0.7 mg/dL (ref 0.2–1.2)
Total Protein: 7.2 g/dL (ref 6.0–8.3)

## 2023-01-03 LAB — CBC WITH DIFFERENTIAL/PLATELET
Basophils Absolute: 0 10*3/uL (ref 0.0–0.1)
Basophils Relative: 0.4 % (ref 0.0–3.0)
Eosinophils Absolute: 0.1 10*3/uL (ref 0.0–0.7)
Eosinophils Relative: 1.5 % (ref 0.0–5.0)
HCT: 42 % (ref 39.0–52.0)
Hemoglobin: 13.7 g/dL (ref 13.0–17.0)
Lymphocytes Relative: 20.2 % (ref 12.0–46.0)
Lymphs Abs: 0.7 10*3/uL (ref 0.7–4.0)
MCHC: 32.6 g/dL (ref 30.0–36.0)
MCV: 84.2 fl (ref 78.0–100.0)
Monocytes Absolute: 0.3 10*3/uL (ref 0.1–1.0)
Monocytes Relative: 9.2 % (ref 3.0–12.0)
Neutro Abs: 2.5 10*3/uL (ref 1.4–7.7)
Neutrophils Relative %: 68.7 % (ref 43.0–77.0)
Platelets: 282 10*3/uL (ref 150.0–400.0)
RBC: 4.99 Mil/uL (ref 4.22–5.81)
RDW: 15.6 % — ABNORMAL HIGH (ref 11.5–15.5)
WBC: 3.6 10*3/uL — ABNORMAL LOW (ref 4.0–10.5)

## 2023-01-03 MED ORDER — PANCRELIPASE (LIP-PROT-AMYL) 36000-114000 UNITS PO CPEP: ORAL_CAPSULE | ORAL | 11 refills | Status: DC

## 2023-01-03 NOTE — Progress Notes (Signed)
Joe Kuperman Reisig Sr. 62 y.o. [birth year] [id number]  Assessment & Plan:   Encounter Diagnoses  Name Primary?   Alcohol-induced chronic pancreatitis (HCC) Yes   Hepatic steatosis    Helicobacter pylori gastritis    Chronic pancreatitis, unspecified pancreatitis type ([place])    He is stable and improved actually.  Stool testing for H. pylori today and additional labs as below.  Continue current therapy does not need omeprazole anymore.  I have refilled his Creon he ran out and said that it was helping with stool consistency.  He continues to be abstinent from alcohol which he needs to do. Orders Placed This Encounter  Procedures   CBC with Differential/Platelet   Comprehensive metabolic panel   Other/Misc lab test    Meds ordered this encounter  Medications   lipase/protease/amylase (CREON) 36000 UNITS CPEP capsule    Sig: Take 2 capsules (72,000 Units total) by mouth 3 (three) times daily with meals AND 1 capsule (36,000 Units total) with snacks.    Dispense:  240 capsule    Refill:  11    Follow-up pending the above results  Note that his 2020 colonoscopy was during a hemorrhage and was really not suitable or adequate for polyp detection so we will recommend a screening colonoscopy pending the review of above.  Subjective:   Chief Complaint: Follow-up of chronic pancreatitis and H. pylori  HPI  62 year old African-American man with alcoholic pancreatitis and recent H. pylori gastritis diagnosis.  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  Diagnosed with pancreatic insufficiency.    Full EUS findings as outlined below.  He says his stools are looser since he ran out of Creon and is asking for a refill.  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  He was treated by Dr. Wynetta Emery with amoxicillin Biaxin and PPI for H. pylori.  He is not taking omeprazole anymore.  He reports  he is abstinent from alcohol.  Says abdominal pain has resolved.  CBC showed hemoglobin 12 MCV 82 normal white count and platelets in October B12 level was 901 it had been 174 in August of last year  Pancreatic elastase test less than 18 June 2022 Wt Readings from Last 3 Encounters:  01/03/23 151 lb 3.2 oz (68.6 kg)  10/03/22 152 lb (68.9 kg)  08/22/22 150 lb (68 kg)   EUS 10/03/2022  EGD impression: - No gross lesions in the proximal esophagus and in the mid esophagus. - Salmon-colored mucosal islands suspicious for short-segment Barrett's esophagus. Biopsied. - Erythematous mucosa in the stomach. Biopsied. - No gross lesions in the duodenal bulb, in the first portion of the duodenum and in the second portion of the duodenum. - Normal major papilla and minor papilla.  EUS impression: - Pancreatic parenchymal abnormalities consisting of atrophy, lobularity and hyperechoicstrands were noted in the entire pancreas. - The pancreatic duct had a dilated endosonographic appearance, had concern for an intraductal stone in the head of pancreas and had a strictured endosonographic appearance within the body of the pancreas. Duct dilation noted throughout (see above). - There was dilation in the common bile duct and in the common hepatic duct. There is gallbladder sludge but no evidence of stone disease within the biliary system - No malignant-appearing lymph nodes were visualized in the celiac region (level 20), peripancreatic region and porta hepatis region.  A. STOMACH, BIOPSY:  - Gastric antral  and oxyntic mucosa with Helicobacter pylori-associated  gastritis  - Helicobacter pylori-like organisms were identified on routine HE  stain   B. ESOPHAGUS, DISTAL, BIOPSY:  - Esophageal squamous and cardiac mucosa with mild chronic nonspecific  carditis  - Negative for intestinal metaplasia or dysplasia   Allergies  Allergen Reactions   Hctz [Hydrochlorothiazide] Rash   Current Meds  Medication Sig    amLODipine (NORVASC) 10 MG tablet Take 1 tablet (10 mg total) by mouth daily.   Blood Pressure Monitor KIT Used to monitor blood pressure daily and as directed   cyanocobalamin (VITAMIN B12) 1000 MCG tablet Take 1 tablet (1,000 mcg total) by mouth daily.   losartan (COZAAR) 50 MG tablet Take 1 tablet (50 mg total) by mouth daily.       lipase/protease/amylase (CREON) 36000 UNITS CPEP capsule Take 2 capsules (72,000 Units total) by mouth 3 (three) times daily with meals AND 1 capsule (36,000 Units total) with snacks. (Patient taking differently: Take 1 capsules (36,000 Units total) by mouth 3 (three) times daily with meals AND 1 capsule (36,000 Units total) with snacks.)           Past Medical History:  Diagnosis Date   Diabetes mellitus without complication (HCC)    H. 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   Past Surgical History:  Procedure Laterality Date   BIOPSY  10/03/2022   Procedure: BIOPSY;  Surgeon: Irving Copas., MD;  Location: [facility];  Service: Gastroenterology;;   COLONOSCOPY WITH PROPOFOL N/A 11/08/2018   Procedure: COLONOSCOPY WITH PROPOFOL;  Surgeon: Lavena Bullion, DO;  Location: [facility];  Service: Gastroenterology;  Laterality: N/A;   ESOPHAGOGASTRODUODENOSCOPY (EGD) WITH PROPOFOL N/A 11/06/2018   Procedure: ESOPHAGOGASTRODUODENOSCOPY (EGD) WITH PROPOFOL;  Surgeon: Carol Ada, MD;  Location: [facility];  Service: Endoscopy;  Laterality: N/A;   ESOPHAGOGASTRODUODENOSCOPY (EGD) WITH PROPOFOL N/A 10/03/2022   Procedure: ESOPHAGOGASTRODUODENOSCOPY (EGD) WITH PROPOFOL;  Surgeon: Rush Landmark Telford Nab., MD;  Location: [facility];  Service: Gastroenterology;  Laterality: N/A;   EUS  11/06/2018   Procedure: FULL UPPER ENDOSCOPIC ULTRASOUND (EUS) RADIAL;  Surgeon: Carol Ada, MD;  Location: [facility];  Service: Endoscopy;;   EUS N/A 10/03/2022   Procedure: UPPER ENDOSCOPIC ULTRASOUND (EUS) RADIAL;  Surgeon: Irving Copas., MD;  Location: [facility];  Service: Gastroenterology;  Laterality: N/A;   GIVENS CAPSULE STUDY N/A 11/08/2018   Procedure: GIVENS CAPSULE STUDY;  Surgeon: Lavena Bullion, DO;  Location: [facility];  Service: Gastroenterology;  Laterality: N/A;   IR ANGIOGRAM SELECTIVE EACH ADDITIONAL VESSEL  11/10/2018   IR ANGIOGRAM VISCERAL SELECTIVE  11/10/2018   IR ANGIOGRAM VISCERAL SELECTIVE  11/10/2018   IR EMBO ART  VEN HEMORR LYMPH EXTRAV  INC GUIDE ROADMAPPING  11/10/2018   IR US GUIDE VASC ACCESS RIGHT  11/10/2018   KNEE SURGERY Bilateral 1985   Arthroscopic for torn cartilage and ligaments.     SHOULDER OPEN ROTATOR CUFF REPAIR Left 05/10/2020   Procedure: LEFT SHOULDER OPEN ROTATOR CUFF REPAIR AND BICEPS TENODESIS;  Surgeon: Leandrew Koyanagi, MD;  Location: [facility];  Service: Orthopedics;  Laterality: Left;   Social History   Social History Narrative   Works second shift at [employer] and [employer] with his partner of 30+ years and adult daughter [name].   40 oz beer/day  No drug   Never smoker      family history includes Cancer in his mother; Hypertension in his father and mother.   Review of Systems As per HPI  Objective:   Physical Exam '@BP'$  134/80   Pulse 79   Ht '5\' 10"'$  (1.778 m)   Wt 151 lb 3.2 oz (68.6 kg)   SpO2 98%   BMI 21.69 kg/m @  General:  NAD Eyes:   anicteric Lungs:  clear Heart::  S1S2 no rubs, murmurs or gallops Abdomen:  soft and nontender, BS+ Ext:   no edema, cyanosis or clubbing    Data Reviewed:  See HPI

## 2023-01-03 NOTE — Patient Instructions (Addendum)
Your provider has requested that you go to the basement level for lab work before leaving today. Press "B" on the elevator. The lab is located at the first door on the left as you exit the elevator.  Due to recent changes in healthcare laws, you may see the results of your imaging and laboratory studies on MyChart before your provider has had a chance to review them.  We understand that in some cases there may be results that are confusing or concerning to you. Not all laboratory results come back in the same time frame and the provider may be waiting for multiple results in order to interpret others.  Please give [place] 48 hours in order for your provider to thoroughly review all the results before contacting the office for clarification of your results.   We have sent the following medications to your pharmacy for you to pick up at your convenience: Creon   I appreciate the opportunity to care for you. Silvano Rusk, MD, [facility]

## 2023-02-25 ENCOUNTER — Other Ambulatory Visit: Payer: Self-pay

## 2023-02-25 ENCOUNTER — Encounter: Payer: Self-pay | Admitting: Internal Medicine

## 2023-02-25 ENCOUNTER — Ambulatory Visit: Payer: Medicaid Other | Attending: Internal Medicine | Admitting: Internal Medicine

## 2023-02-25 VITALS — BP 145/90 | HR 66 | Temp 98.2°F | Ht 70.0 in | Wt 155.0 lb

## 2023-02-25 DIAGNOSIS — Z8249 Family history of ischemic heart disease and other diseases of the circulatory system: Secondary | ICD-10-CM | POA: Diagnosis not present

## 2023-02-25 DIAGNOSIS — K760 Fatty (change of) liver, not elsewhere classified: Secondary | ICD-10-CM | POA: Insufficient documentation

## 2023-02-25 DIAGNOSIS — R7303 Prediabetes: Secondary | ICD-10-CM | POA: Insufficient documentation

## 2023-02-25 DIAGNOSIS — Z79899 Other long term (current) drug therapy: Secondary | ICD-10-CM | POA: Diagnosis not present

## 2023-02-25 DIAGNOSIS — I10 Essential (primary) hypertension: Secondary | ICD-10-CM | POA: Diagnosis present

## 2023-02-25 DIAGNOSIS — K861 Other chronic pancreatitis: Secondary | ICD-10-CM | POA: Diagnosis not present

## 2023-02-25 DIAGNOSIS — D709 Neutropenia, unspecified: Secondary | ICD-10-CM | POA: Diagnosis not present

## 2023-02-25 DIAGNOSIS — E538 Deficiency of other specified B group vitamins: Secondary | ICD-10-CM | POA: Diagnosis not present

## 2023-02-25 MED ORDER — VITAMIN B-12 1000 MCG PO TABS
1000.0000 ug | ORAL_TABLET | Freq: Every day | ORAL | 2 refills | Status: DC
  Filled 2023-02-25: qty 130, 130d supply, fill #0

## 2023-02-25 MED ORDER — LOSARTAN POTASSIUM 100 MG PO TABS
100.0000 mg | ORAL_TABLET | Freq: Every day | ORAL | 6 refills | Status: DC
  Filled 2023-02-25: qty 30, 30d supply, fill #0

## 2023-02-25 MED ORDER — AMLODIPINE BESYLATE 10 MG PO TABS
10.0000 mg | ORAL_TABLET | Freq: Every day | ORAL | 5 refills | Status: DC
  Filled 2023-02-25: qty 30, 30d supply, fill #0

## 2023-02-25 NOTE — Patient Instructions (Signed)
Your blood pressure is not at goal. Increased losartan to 100 mg daily.  After you have been on the increased dose for 1 to 2 weeks, please return to the lab to have blood test done to recheck your potassium and kidney function.

## 2023-02-25 NOTE — Progress Notes (Signed)
Patient ID: Joe TIFFANY Sr., male    DOB: Dec 21, 1960  MRN: [id number]  CC: Hypertension (HTN f/u. Med refills. Ottis Stain on R shoulder X1 mo)   Subjective: Joe [name] is a 62 y.o. male who presents for chronic disease management.  Mary, his significant other is with him. 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   HYPERTENSION Currently taking: see medication list.  Took already for today.  He is on Cozaar 50 mg daily and amlodipine 10 mg daily. Med Adherence: [x]  Yes    []  No Medication side effects: []  Yes    [x]  No Adherence with salt restriction: [x]  Yes    []  No Home Monitoring?: []  Yes    [x]  No Monitoring Frequency:  Home BP results range:  SOB? []  Yes    [x]  No Chest Pain?: []  Yes    []  No Leg swelling?: []  Yes    []  No Headaches?: []  Yes    []  No Dizziness? [x]  Yes occasional    []  No Comments:   B12 def:  taking Vit B12 1000 mcg daily He has had chronic intermittent low white blood cell count.  Most recent level was 3.6 which is improved from previous that were in the 2s.  Chronic Pancreatitis:  saw Dr. Leone Payor.  Remains on Creon.  Needs repeat c-scope but no appt as yet. Drinks one 12 oz beer a day.   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 240 capsule 11   No current facility-administered medications on file prior to visit.    Allergies  Allergen Reactions   Hctz [Hydrochlorothiazide] Rash    Social History   Socioeconomic History   Marital status: Media planner    Spouse name: April Holding   Number of children: 2   Years of education: 12   Highest education level: 12th grade  Occupational History   Occupation: Packing/stacking/loading    Comment: soaps, shaving implements, etc.  Tobacco Use   Smoking status: Never   Smokeless tobacco: Never  Vaping Use   Vaping Use: Never used  Substance and Sexual Activity   Alcohol use: Yes    Comment: 6 pack of beer daily   Drug use: No   Sexual activity: Yes  Other Topics Concern   Not on file  Social History  Narrative   Works second shift at [employer] and [employer] with his partner of 30+ years and adult daughter Karolee Stamps.   40 oz beer/day   No drug   Never smoker      Social Determinants of Corporate investment banker Strain: Not on file  Food Insecurity: Not on file  Transportation Needs: Not on file  Physical Activity: Not on file  Stress: Not on file  Social Connections: Not on file  Intimate Partner Violence: Not At Risk (02/18/2018)   Humiliation, Afraid, Rape, and Kick questionnaire    Fear of Current or Ex-Partner: No    Emotionally Abused: No    Physically Abused: No    Sexually Abused: No    Family History  Problem Relation  Age of Onset   Cancer Mother        uncertain what was wrong--mother would not share, but gradual decline.   Hypertension Mother    Hypertension Father     Past Surgical History:  Procedure Laterality Date   BIOPSY  10/03/2022   Procedure: BIOPSY;  Surgeon: Meridee Score Netty Starring., MD;  Location: [facility];  Service: Gastroenterology;;   COLONOSCOPY WITH PROPOFOL N/A 11/08/2018   Procedure: COLONOSCOPY WITH PROPOFOL;  Surgeon: Shellia Cleverly, DO;  Location: [facility];  Service: Gastroenterology;  Laterality: N/A;   ESOPHAGOGASTRODUODENOSCOPY (EGD) WITH PROPOFOL N/A 11/06/2018   Procedure: ESOPHAGOGASTRODUODENOSCOPY (EGD) WITH PROPOFOL;  Surgeon: Jeani Hawking, MD;  Location[facility];  Service: Endoscopy;  Laterality: N/A;   ESOPHAGOGASTRODUODENOSCOPY (EGD) WITH PROPOFOL N/A 10/03/2022   Procedure: ESOPHAGOGASTRODUODENOSCOPY (EGD) WITH PROPOFOL;  Surgeon: Meridee Score Netty Starring., MD;  Location: [facility];  Service: Gastroenterology;  Laterality: N/A;   EUS  11/06/2018   Procedure: FULL UPPER ENDOSCOPIC ULTRASOUND (EUS) RADIAL;  Surgeon: Jeani Hawking, MD;  Location: [facility];  Service: Endoscopy;;   EUS N/A 10/03/2022   Procedure: UPPER ENDOSCOPIC ULTRASOUND (EUS) RADIAL;  Surgeon: Lemar Lofty., MD;  Location: [facility];  Service: Gastroenterology;  Laterality: N/A;   GIVENS CAPSULE STUDY N/A 11/08/2018   Procedure: GIVENS CAPSULE STUDY;  Surgeon: Shellia Cleverly, DO;  Location: [facility];  Service: Gastroenterology;  Laterality: N/A;   IR ANGIOGRAM SELECTIVE EACH ADDITIONAL VESSEL  11/10/2018   IR ANGIOGRAM VISCERAL SELECTIVE  11/10/2018   IR ANGIOGRAM VISCERAL SELECTIVE  11/10/2018   IR EMBO ART  VEN HEMORR LYMPH EXTRAV  INC GUIDE ROADMAPPING  11/10/2018   IR US GUIDE VASC ACCESS RIGHT  11/10/2018   KNEE SURGERY Bilateral 1985   Arthroscopic for torn cartilage and ligaments.     SHOULDER OPEN ROTATOR CUFF REPAIR Left 05/10/2020   Procedure: LEFT SHOULDER OPEN  ROTATOR CUFF REPAIR AND BICEPS TENODESIS;  Surgeon: Tarry Kos, MD;  Location: [facility];  Service: Orthopedics;  Laterality: Left;    ROS: Review of Systems Negative except as stated above  PHYSICAL EXAM: BP (!) 145/90 (BP Location: Left Arm, Patient Position: Sitting, Cuff Size: Normal)   Pulse 66   Temp 98.2 F (36.8 C) (Oral)   Ht  (1.778 m)   Wt 155 lb (70.3 kg)   SpO2 98%   BMI 22.24 kg/m   Physical Exam BP 152/96  General appearance - alert, well appearing, and in no distress Mental status - normal mood, behavior, speech, dress, motor activity, and thought processes Neck - supple, no significant adenopathy Chest - clear to auscultation, no wheezes, rales or rhonchi, symmetric air entry Heart - normal rate, regular rhythm,  normal S1, S2, no murmurs, rubs, clicks or gallops Extremities - peripheral pulses normal, no pedal edema, no clubbing or cyanosis     Latest Ref Rng & Units 01/03/2023   11:26 AM 06/19/2022    9:21 AM 03/19/2022    9:53 AM  CMP  Glucose 70 - 99 mg/dL 85  99    BUN 6 - 23 mg/dL 15  10    Creatinine 1.61 - 1.50 mg/dL 0.96  0.45    Sodium 409 - 145 mEq/L 142  139    Potassium 3.5 - 5.1 mEq/L 4.2  3.9    Chloride 96 - 112 mEq/L 110  105    CO2 19 - 32 mEq/L 24  23    Calcium 8.4 - 10.5 mg/dL 9.3  9.1    Total Protein 6.0 - 8.3 g/dL 7.2  7.3  7.1   Total Bilirubin 0.2 - 1.2 mg/dL 0.7  1.0  0.9   Alkaline Phos 39 - 117 U/L 88  96  104   AST 0 - 37 U/L 40  73  66   ALT 0 - 53 U/L 37  50  51    Lipid Panel     Component Value Date/Time   CHOL 108 02/18/2022 0917   TRIG 45 02/18/2022 0917   HDL 66 02/18/2022 0917   CHOLHDL 1.6 02/18/2022 0917   LDLCALC 30 02/18/2022 0917    CBC    Component Value Date/Time   WBC 3.6 (L) 01/03/2023 1126   RBC 4.99 01/03/2023 1126   HGB 13.7 01/03/2023 1126   HGB 12.1 (L) 08/29/2022 0945   HCT 42.0 01/03/2023 1126   HCT 36.4 (L) 08/29/2022 0945   PLT 282.0 01/03/2023 1126   PLT 262  08/29/2022 0945   MCV 84.2 01/03/2023 1126   MCV 82 08/29/2022 0945   MCH 27.1 08/29/2022 0945   MCH 16.4 (L) 10/16/2019 0858   MCHC 32.6 01/03/2023 1126   RDW 15.6 (H) 01/03/2023 1126   RDW 13.7 08/29/2022 0945   LYMPHSABS 0.7 01/03/2023 1126   LYMPHSABS 0.7 08/29/2022 0945   MONOABS 0.3 01/03/2023 1126   EOSABS 0.1 01/03/2023 1126   EOSABS 0.2 08/29/2022 0945   BASOSABS 0.0 01/03/2023 1126   BASOSABS 0.0 08/29/2022 0945    ASSESSMENT AND PLAN:  1. Essential hypertension Not at goal.  Continue Norvasc 10 mg.  Increase Cozaar to 100 mg daily.  Return to lab for BMP after being on increased dose for 1 week. - amLODipine (NORVASC) 10 MG tablet; Take 1 tablet (10 mg total) by mouth daily.  Dispense: 30 tablet; Refill: 5 - losartan (COZAAR) 100 MG tablet; Take 1 tablet (100 mg total) by mouth daily.  Dispense: 30 tablet; Refill: 6 - Basic Metabolic Panel; Future  2. Neutropenia, unspecified type Overall improved.  Could be due to vitamin B12 deficiency versus benign neutropenia that is sometimes seen in African-Americans.  3. Chronic pancreatitis, unspecified pancreatitis type Continue Creon. Strongly advised to discontinue all alcoholic beverages. Message sent to Dr. Marvell Fuller CMA informing that patient has not received appointment as yet for colonoscopy as recommended by Dr. Leone Payor.  4. Vitamin B12 deficiency - cyanocobalamin (VITAMIN B12) 1000 MCG tablet; Take 1 tablet (1,000 mcg total) by mouth daily.  Dispense: 130 tablet; Refill: 2     Patient was given the opportunity to ask questions.  Patient verbalized understanding of the plan and was able to repeat key elements of the plan.   This documentation was completed using  Paediatric nurse.  Any transcriptional errors are unintentional.  Orders Placed This Encounter  Procedures   Basic Metabolic Panel     Requested Prescriptions   Signed Prescriptions Disp Refills   amLODipine (NORVASC) 10 MG  tablet 30 tablet 5    Sig: Take 1 tablet (10 mg total) by mouth daily.   losartan (COZAAR) 100 MG tablet 30 tablet 6    Sig: Take 1 tablet (100 mg total) by mouth daily.   cyanocobalamin (VITAMIN B12) 1000 MCG tablet 130 tablet 2    Sig: Take 1 tablet (1,000 mcg total) by mouth daily.    Return in about 4 months (around 06/27/2023) for Give lab visit in 1 wk.  Jonah Blue, MD, FACP

## 2023-02-26 ENCOUNTER — Other Ambulatory Visit: Payer: Self-pay

## 2023-03-01 ENCOUNTER — Telehealth: Payer: Self-pay | Admitting: Internal Medicine

## 2023-03-01 NOTE — Telephone Encounter (Signed)
-----   Message from Patti E [place], [place] sent at 02/28/2023  3:30 PM EDT ----- He set up an appointment for 04/30/2023 to discuss a colon. ----- Message ----- From: Marcine Matar, MD Sent: 02/25/2023   5:38 PM EDT To: Patti E [place], CMA  I am the PCP for this patient.  Dr. Leone Payor saw him last mth and did blood tests.  I looked at his lab note to you.  Pt was to be scheduled for colonoscopy.  Patient states he has not received a call as yet.  I thought I would send a message to make sure that he is scheduled.

## 2023-03-04 ENCOUNTER — Ambulatory Visit: Payer: Medicaid Other | Attending: Internal Medicine

## 2023-03-04 DIAGNOSIS — I10 Essential (primary) hypertension: Secondary | ICD-10-CM

## 2023-03-05 ENCOUNTER — Other Ambulatory Visit: Payer: Self-pay | Admitting: Family Medicine

## 2023-03-05 DIAGNOSIS — E875 Hyperkalemia: Secondary | ICD-10-CM

## 2023-03-05 LAB — BASIC METABOLIC PANEL
BUN/Creatinine Ratio: 14 (ref 10–24)
BUN: 13 mg/dL (ref 8–27)
CO2: 13 mmol/L — ABNORMAL LOW (ref 20–29)
Calcium: 9.1 mg/dL (ref 8.6–10.2)
Chloride: 110 mmol/L — ABNORMAL HIGH (ref 96–106)
Creatinine, Ser: 0.9 mg/dL (ref 0.76–1.27)
Glucose: 146 mg/dL — ABNORMAL HIGH (ref 70–99)
Potassium: 5.3 mmol/L — ABNORMAL HIGH (ref 3.5–5.2)
Sodium: 145 mmol/L — ABNORMAL HIGH (ref 134–144)
eGFR: 97 mL/min/{1.73_m2} (ref >59)

## 2023-03-06 IMAGING — MR MR SHOULDER*L* W/ CM
6 series · 40 of 40 positions shown · IV contrast (agent unspecified)
Comparison: Left shoulder radiograph 02/27/2021

CLINICAL DATA: Shoulder pain, chronic, osteoarthritis suspected

EXAM:
MR ARTHROGRAM OF THE left SHOULDER
TECHNIQUE: Multiplanar, multisequence MR imaging of the left shoulder was
performed following the administration of intra-articular contrast.
CONTRAST:  See Injection Documentation.

[Series 5: T1 fat-sat · axial · 4.0mm · 0.27mm/px · z∈[-47,+48]mm · 6 of 20 slices shown (1 of 4)]
[im 1/20]
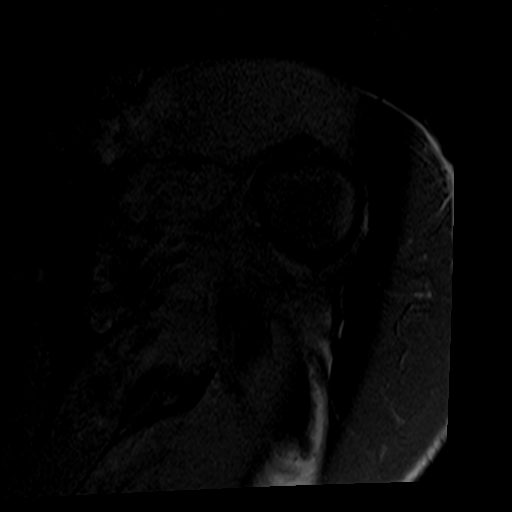
[im 4/20]
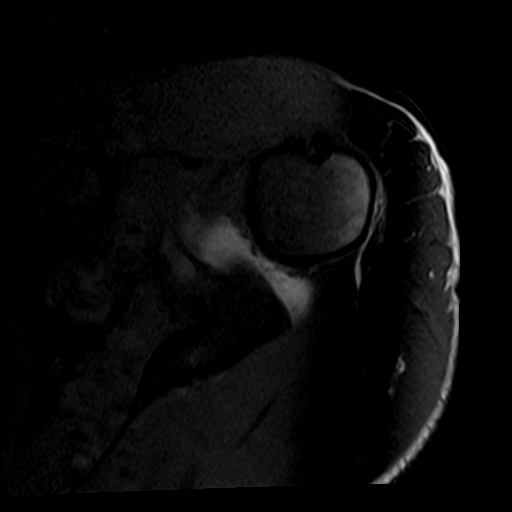
[im 8/20]
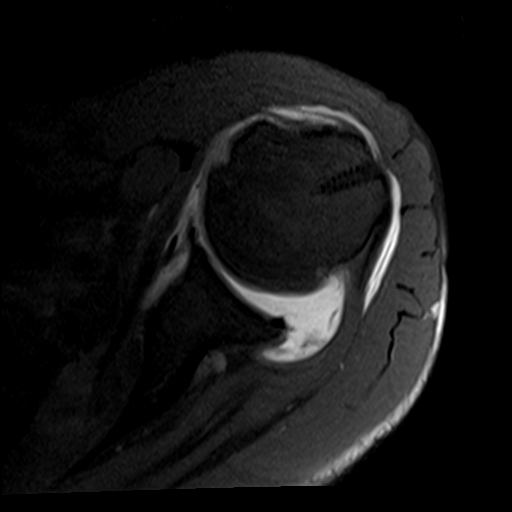
[im 12/20]
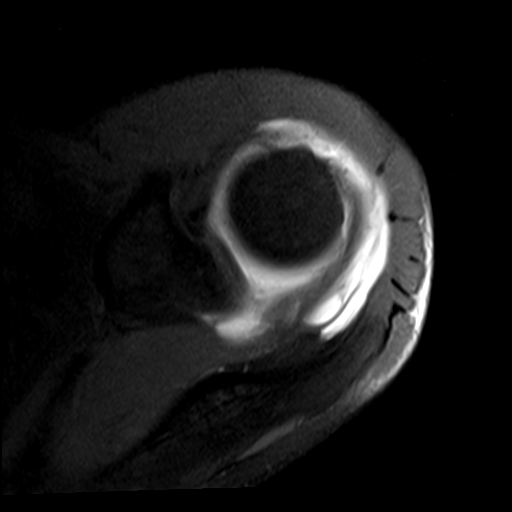
[im 16/20]
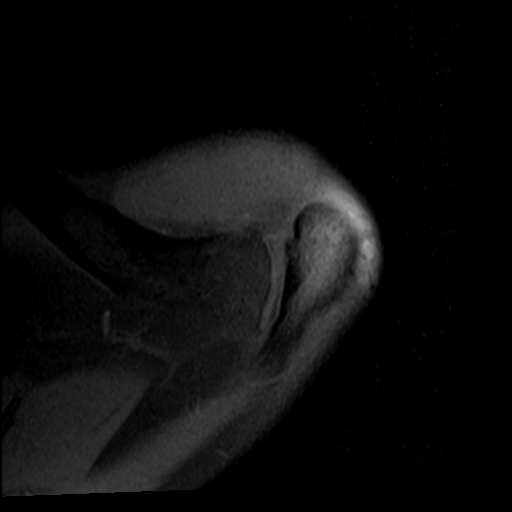
[im 20/20]
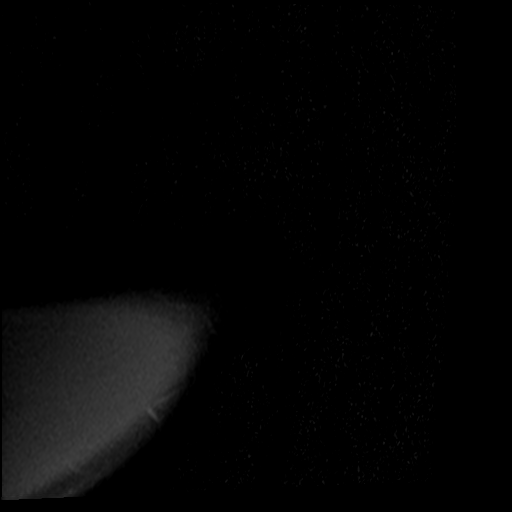

[Series 8: T1 fat-sat · oblique · 4.0mm · 0.55mm/px · 6 of 20 slices shown (2 of 4)]
[im 1/20]
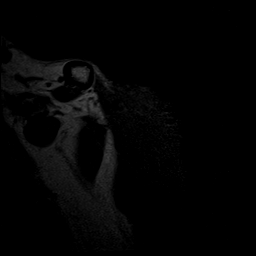
[im 4/20]
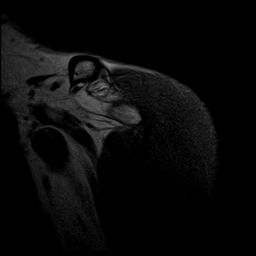
[im 8/20]
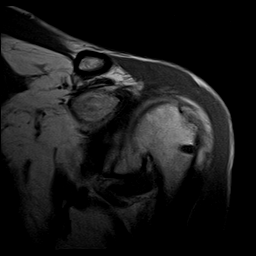
[im 12/20]
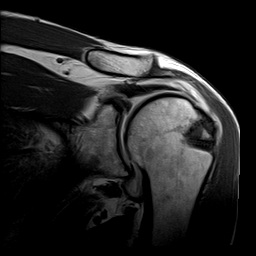
[im 16/20]
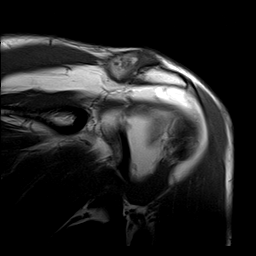
[im 20/20]
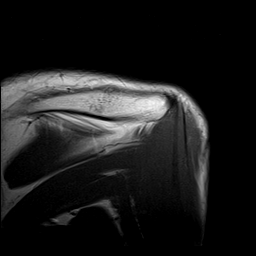

[Series 9: T1 fat-sat · oblique · 4.0mm · 0.55mm/px · 7 of 20 slices shown (3 of 4)]
[im 1/20]
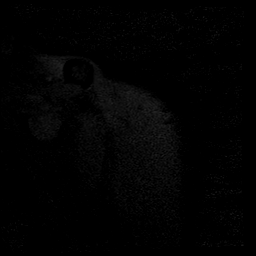
[im 4/20]
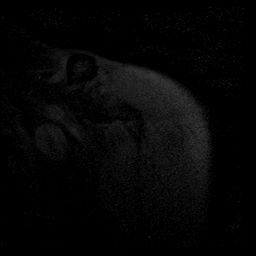
[im 7/20]
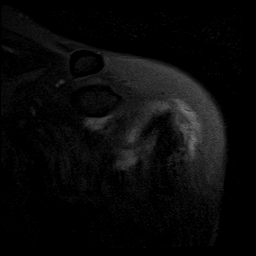
[im 10/20]
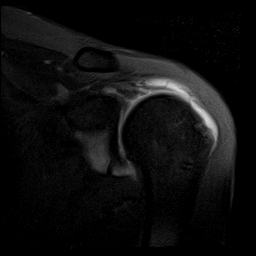
[im 13/20]
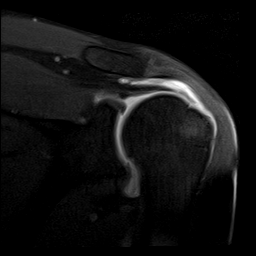
[im 16/20]
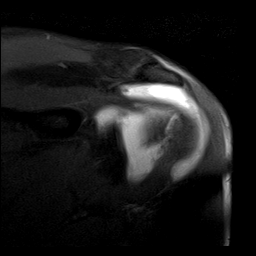
[im 20/20]
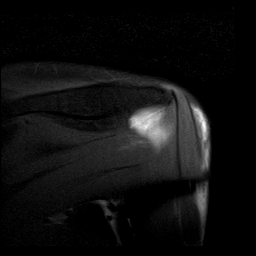

[Series 11: T2 fat-sat · oblique · 4.0mm · 0.55mm/px · 7 of 20 slices shown (1 of 2)]
[im 1/20]
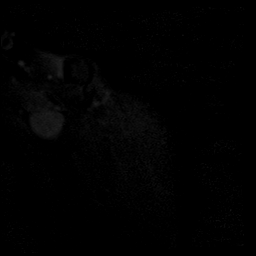
[im 4/20]
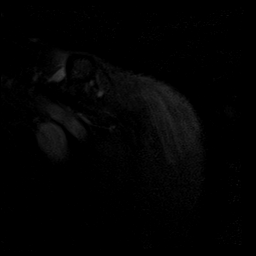
[im 7/20]
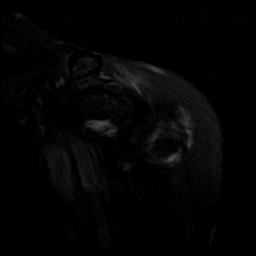
[im 10/20]
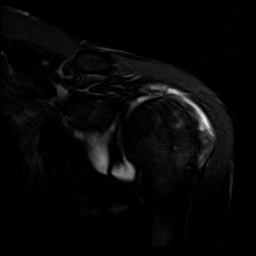
[im 13/20]
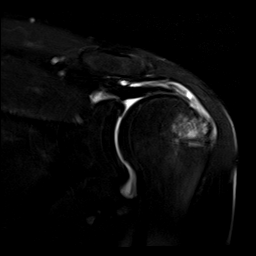
[im 16/20]
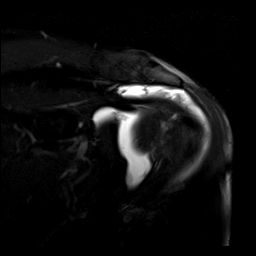
[im 20/20]
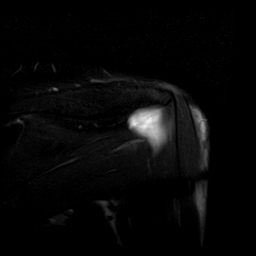

[Series 12: T2 fat-sat · oblique · 4.0mm · 0.55mm/px · 7 of 20 slices shown (2 of 2)]
[im 1/20]
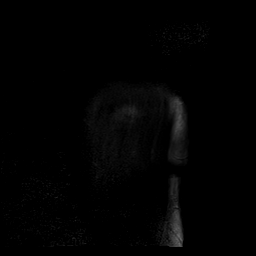
[im 4/20]
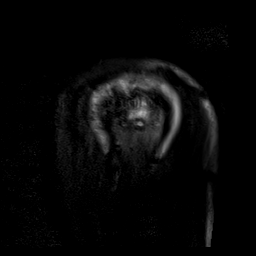
[im 7/20]
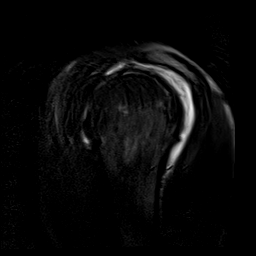
[im 10/20]
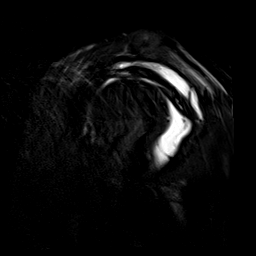
[im 13/20]
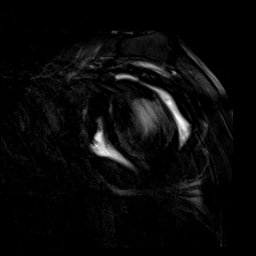
[im 16/20]
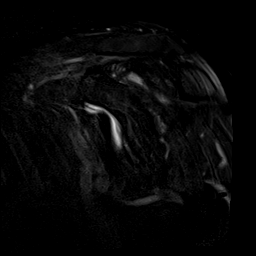
[im 20/20]
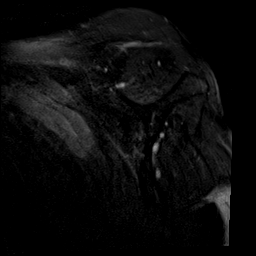

[Series 15: T1 fat-sat · sagittal · 4.0mm · 0.59mm/px · 7 of 20 slices shown (4 of 4)]
[im 1/20]
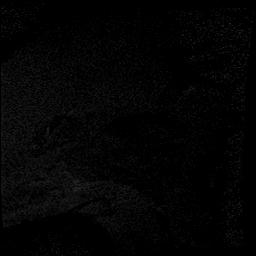
[im 4/20]
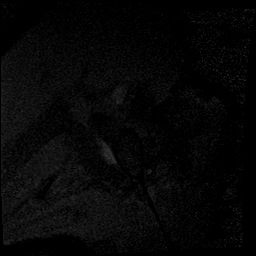
[im 7/20]
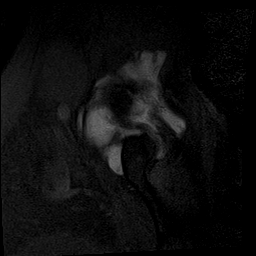
[im 10/20]
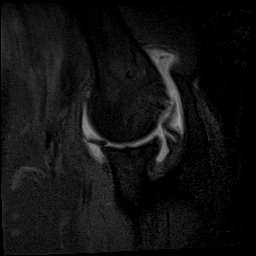
[im 13/20]
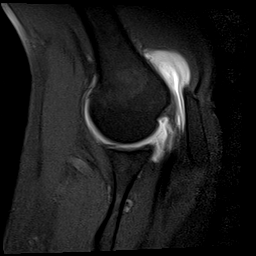
[im 16/20]
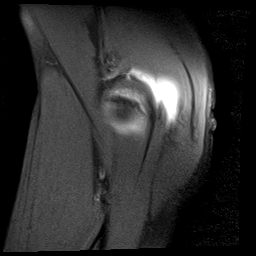
[im 20/20]
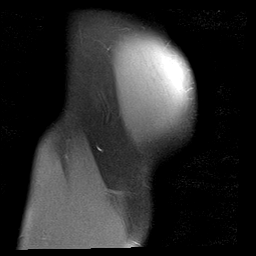

[40 of 40 positions shown; findings below may reference images not displayed]

FINDINGS: Rotator cuff: Evidence of prior cuff repair. There is a
full-thickness, essentially full width tear of the supraspinatus
tendon from the footprint with at least 3.0 cm retraction. Distal
infraspinatus tendinosis with articular sided fraying. There is
significant thinning of the subscapularis tendon with full-thickness
retracted tearing of the most cephalad fibers to the level of the
glenoid. There are a few intact inferior fibers.

Muscles: Grade 1 supraspinatus and infraspinatus atrophy.

Biceps Long Head: Prior biceps tenodesis.

Acromioclavicular Joint: Mild arthropathy of the acromioclavicular
joint.

Glenohumeral Joint: Adequate distention of the glenohumeral joint.
No chondral defect.

Labrum: There is undercutting of the inferior labrum from
approximately [DATE] to [DATE].

Bones: No fracture or dislocation. No aggressive osseous lesion.

Other: No fluid collection or hematoma.
IMPRESSION: Prior cuff repair with evidence of a recurrent, full-thickness full
width tear of the supraspinatus tendon from the footprint with a
least 3.0 cm retraction. Distal infraspinatus the tendinosis with
articular sided fraying.

Thinning of the subscapularis tendon with full-thickness retracted
tearing of the most cephalad fibers the to the level of the glenoid.
Prior biceps tenodesis.

Undercutting of the inferior labrum from approximately [DATE] to [DATE]
compatible with nondisplaced labral tearing.

## 2023-03-19 ENCOUNTER — Ambulatory Visit: Payer: Medicaid Other | Attending: Internal Medicine

## 2023-03-19 DIAGNOSIS — E875 Hyperkalemia: Secondary | ICD-10-CM

## 2023-03-20 LAB — POTASSIUM: Potassium: 4.2 mmol/L (ref 3.5–5.2)

## 2023-04-30 ENCOUNTER — Ambulatory Visit: Payer: Medicaid Other | Admitting: Physician Assistant

## 2023-05-29 ENCOUNTER — Other Ambulatory Visit: Payer: Self-pay

## 2023-07-08 ENCOUNTER — Ambulatory Visit: Payer: Medicaid Other | Attending: Internal Medicine | Admitting: Internal Medicine

## 2023-07-08 ENCOUNTER — Other Ambulatory Visit: Payer: Self-pay

## 2023-07-08 ENCOUNTER — Encounter: Payer: Self-pay | Admitting: Internal Medicine

## 2023-07-08 VITALS — BP 165/102 | HR 62 | Temp 98.2°F | Ht 70.0 in | Wt 153.0 lb

## 2023-07-08 DIAGNOSIS — K861 Other chronic pancreatitis: Secondary | ICD-10-CM | POA: Diagnosis not present

## 2023-07-08 DIAGNOSIS — Z2821 Immunization not carried out because of patient refusal: Secondary | ICD-10-CM

## 2023-07-08 DIAGNOSIS — Z79899 Other long term (current) drug therapy: Secondary | ICD-10-CM | POA: Insufficient documentation

## 2023-07-08 DIAGNOSIS — K760 Fatty (change of) liver, not elsewhere classified: Secondary | ICD-10-CM | POA: Diagnosis not present

## 2023-07-08 DIAGNOSIS — E538 Deficiency of other specified B group vitamins: Secondary | ICD-10-CM | POA: Insufficient documentation

## 2023-07-08 DIAGNOSIS — I728 Aneurysm of other specified arteries: Secondary | ICD-10-CM | POA: Diagnosis not present

## 2023-07-08 DIAGNOSIS — I10 Essential (primary) hypertension: Secondary | ICD-10-CM | POA: Diagnosis present

## 2023-07-08 MED ORDER — AMLODIPINE BESYLATE 10 MG PO TABS
10.0000 mg | ORAL_TABLET | Freq: Every day | ORAL | 1 refills | Status: DC
Start: 2023-07-08 — End: 2023-11-13
  Filled 2023-07-08: qty 90, 90d supply, fill #0
  Filled 2023-10-14: qty 90, 90d supply, fill #1

## 2023-07-08 MED ORDER — LOSARTAN POTASSIUM 100 MG PO TABS
100.0000 mg | ORAL_TABLET | Freq: Every day | ORAL | 1 refills | Status: DC
Start: 2023-07-08 — End: 2023-11-13
  Filled 2023-07-08 (×2): qty 90, 90d supply, fill #0

## 2023-07-08 MED ORDER — VITAMIN B-12 1000 MCG PO TABS
1000.0000 ug | ORAL_TABLET | Freq: Every day | ORAL | 2 refills | Status: AC
Start: 2023-07-08 — End: ?
  Filled 2023-07-08: qty 30, 30d supply, fill #0
  Filled 2023-12-31: qty 30, 30d supply, fill #1
  Filled 2024-02-11: qty 30, 30d supply, fill #2
  Filled 2024-04-07: qty 30, 30d supply, fill #3

## 2023-07-08 NOTE — Progress Notes (Signed)
Patient ID: Joe CAPITO Sr., male    DOB: Feb 19, 1961  MRN: [id number]  CC: Hypertension (HTN f/u. Med refills. /No questions / concerns /No to flu vax. )   Subjective: Axiel Shrewsbury is a 62 y.o. male who presents for chronic ds management.    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   HYPERTENSION Currently taking: see medication list.  Should be on Cozaar 100 mg daily (increased on last visit) and amlodipine 10 mg daily. Not taking Norvasc.  Thought we had stopped it on last visit Med Adherence: only on Cozaar and took already this a.m Medication side effects: []  Yes    [x]  No Adherence with salt restriction: [x]  Yes    []  No Home Monitoring?: []  Yes    [x]  No; no device; would like one Monitoring Frequency:  Home BP results range:  SOB? []  Yes    [x]  No Chest Pain?: []  Yes    [x]  No Leg swelling?: []  Yes    [x]  No Headaches?: []  Yes    [x]  No Dizziness? 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 Out B12 supplement x 1 mth.  Taking Creon -has appt with GI 07/16/23 to discuss c-scope.  Request referral back to Dr. Roda Shutters for inj LT shoulder.  Seen 09/2022 for rotator cuff arthropathy.   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 30 tablet 5   Blood Pressure Monitor KIT Used to monitor blood pressure daily and as directed 1 each 0   cyanocobalamin (VITAMIN B12) 1000 MCG tablet Take 1 tablet (1,000 mcg total) by mouth daily. 130 tablet 2   lipase/protease/amylase (CREON) 36000 UNITS CPEP capsule Take 2 capsules (72,000 Units total) by mouth 3 (three) times daily with meals AND 1 capsule (36,000 Units total) with snacks. 240 capsule 11   losartan (COZAAR) 100 MG tablet Take 1 tablet (100 mg total) by mouth daily. 30 tablet 6   No current facility-administered medications on file prior to visit.    Allergies  Allergen Reactions   Hctz [Hydrochlorothiazide] Rash    Social History   Socioeconomic History   Marital status: Media planner    Spouse name: April Holding   Number of children: 2   Years of education: 12   Highest education level: 12th grade  Occupational History  Occupation: Packing/stacking/loading    Comment: soaps, shaving implements, etc.  Tobacco Use   Smoking status: Never   Smokeless tobacco: Never  Vaping Use   Vaping status: Never Used  Substance and Sexual Activity   Alcohol use: Yes    Comment: 6 pack of beer daily   Drug use: No   Sexual activity: Yes  Other Topics Concern   Not on file  Social History Narrative   Works second shift at [employer]   Lives with his partner of 30+ years and adult daughter Karolee Stamps.   40 oz beer/day   No drug   Never smoker      Social Determinants of Manufacturing engineer Strain: Not on file  Food Insecurity: Not on file  Transportation Needs: Not on file  Physical Activity: Not on file  Stress: Not on file  Social Connections: Not on file  Intimate Partner Violence: Not At Risk (02/18/2018)   Humiliation, Afraid, Rape, and Kick questionnaire    Fear of Current or Ex-Partner: No    Emotionally Abused: No    Physically Abused: No    Sexually Abused: No    Family History  Problem Relation Age of Onset   Cancer Mother        uncertain what was wrong--mother would not share, but gradual decline.   Hypertension Mother    Hypertension Father     Past Surgical History:  Procedure Laterality Date   BIOPSY  10/03/2022   Procedure: BIOPSY;  Surgeon: Meridee Score Netty Starring., MD;  Location: [facility];  Service: Gastroenterology;;   COLONOSCOPY WITH PROPOFOL N/A 11/08/2018   Procedure: COLONOSCOPY WITH PROPOFOL;  Surgeon: Shellia Cleverly, DO;  Location: [facility];  Service: Gastroenterology;  Laterality: N/A;   ESOPHAGOGASTRODUODENOSCOPY (EGD) WITH PROPOFOL N/A 11/06/2018   Procedure: ESOPHAGOGASTRODUODENOSCOPY (EGD) WITH PROPOFOL;  Surgeon: Jeani Hawking, MD;  Location: [facility] ENDOSCOPY;  Service: Endoscopy;  Laterality: N/A;   ESOPHAGOGASTRODUODENOSCOPY (EGD) WITH PROPOFOL N/A 10/03/2022   Procedure: ESOPHAGOGASTRODUODENOSCOPY (EGD) WITH PROPOFOL;  Surgeon: Meridee Score Netty Starring., MD;  Location: [facility];  Service: Gastroenterology;  Laterality: N/A;   EUS  11/06/2018   Procedure: FULL UPPER ENDOSCOPIC ULTRASOUND (EUS) RADIAL;  Surgeon: Jeani Hawking, MD;  Location: [facility] ENDOSCOPY;  Service: Endoscopy;;   EUS N/A 10/03/2022   Procedure: UPPER ENDOSCOPIC ULTRASOUND (EUS) RADIAL;  Surgeon: Lemar Lofty., MD;  Location: [facility] ENDOS[facility];  Service: Gastroenterology;  Laterality: N/A;   GIVENS CAPSULE STUDY N/A 11/08/2018   Procedure: GIVENS CAPSULE STUDY;  Surgeon: Shellia Cleverly, DO;  Location: [facility] ENDOS[facility];  Service:  Gastroenterology;  Laterality: N/A;   IR ANGIOGRAM SELECTIVE EACH ADDITIONAL VESSEL  11/10/2018   IR ANGIOGRAM VISCERAL SELECTIVE  11/10/2018   IR ANGIOGRAM VISCERAL SELECTIVE  11/10/2018   IR EMBO ART  VEN HEMORR LYMPH EXTRAV  INC GUIDE ROADMAPPING  11/10/2018   IR US GUIDE VASC ACCESS RIGHT  11/10/2018   KNEE SURGERY Bilateral 1985   Arthroscopic for torn cartilage and ligaments.     SHOULDER OPEN ROTATOR CUFF REPAIR Left 05/10/2020   Procedure: LEFT SHOULDER OPEN ROTATOR CUFF REPAIR AND BICEPS TENODESIS;  Surgeon: Tarry Kos, MD;  Location: [facility];  Service: Orthopedics;  Laterality: Left;    ROS: Review of Systems Negative except as stated above  PHYSICAL EXAM: BP (!) 165/102   Pulse 62   Temp 98.2 F (36.8 C) (Oral)   Ht 5\' 10"  (1.778 m)   Wt 153 lb (69.4 kg)  SpO2 99%   BMI 21.95 kg/m   Physical Exam Eyes:     Conjunctiva/sclera: Conjunctivae normal.  Cardiovascular:     Rate and Rhythm: Normal rate and regular rhythm.     Heart sounds: Normal heart sounds.  Pulmonary:     Effort: Pulmonary effort is normal.     Breath sounds: Normal breath sounds.  Musculoskeletal:     Left lower leg: No edema.  Neurological:     Mental Status: He is alert.       Latest Ref Rng & Units 03/19/2023    9:27 AM 03/04/2023    9:02 AM 01/03/2023   11:26 AM  CMP  Glucose 70 - 99 mg/dL  644  85   BUN 8 - 27 mg/dL  13  15   Creatinine 0.34 - 1.27 mg/dL  7.42  5.95   Sodium 638 - 144 mmol/L  145  142   Potassium 3.5 - 5.2 mmol/L 4.2  5.3  4.2   Chloride 96 - 106 mmol/L  110  110   CO2 20 - 29 mmol/L  13  24   Calcium 8.6 - 10.2 mg/dL  9.1  9.3   Total Protein 6.0 - 8.3 g/dL   7.2   Total Bilirubin 0.2 - 1.2 mg/dL   0.7   Alkaline Phos 39 - 117 U/L   88   AST 0 - 37 U/L   40   ALT 0 - 53 U/L   37    Lipid Panel     Component Value Date/Time   CHOL 108 02/18/2022 0917   TRIG 45 02/18/2022 0917   HDL 66 02/18/2022 0917   CHOLHDL 1.6 02/18/2022 0917   LDLCALC 30  02/18/2022 0917    CBC    Component Value Date/Time   WBC 3.6 (L) 01/03/2023 1126   RBC 4.99 01/03/2023 1126   HGB 13.7 01/03/2023 1126   HGB 12.1 (L) 08/29/2022 0945   HCT 42.0 01/03/2023 1126   HCT 36.4 (L) 08/29/2022 0945   PLT 282.0 01/03/2023 1126   PLT 262 08/29/2022 0945   MCV 84.2 01/03/2023 1126   MCV 82 08/29/2022 0945   MCH 27.1 08/29/2022 0945   MCH 16.4 (L) 10/16/2019 0858   MCHC 32.6 01/03/2023 1126   RDW 15.6 (H) 01/03/2023 1126   RDW 13.7 08/29/2022 0945   LYMPHSABS 0.7 01/03/2023 1126   LYMPHSABS 0.7 08/29/2022 0945   MONOABS 0.3 01/03/2023 1126   EOSABS 0.1 01/03/2023 1126   EOSABS 0.2 08/29/2022 0945   BASOSABS 0.0 01/03/2023 1126   BASOSABS 0.0 08/29/2022 0945    ASSESSMENT AND PLAN: 1. Essential hypertension Not at goal.  Patient had stopped Norvasc thinking it was discontinued on last visit when in fact we did not.  Refill sent on amlodipine and Cozaar.  Follow-up with clinical pharmacist in 3 to 4 weeks for recheck of blood pressure. - amLODipine (NORVASC) 10 MG tablet; Take 1 tablet (10 mg total) by mouth daily.  Dispense: 90 tablet; Refill: 1 - losartan (COZAAR) 100 MG tablet; Take 1 tablet (100 mg total) by mouth daily.  Dispense: 90 tablet; Refill: 1  2. Vitamin B12 deficiency Refill given on vitamin B12 supplement - cyanocobalamin (VITAMIN B12) 1000 MCG tablet; Take 1 tablet (1,000 mcg total) by mouth daily.  Dispense: 90 tablet; Refill: 2  3. 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  Stable  4. Chronic pancreatitis, unspecified pancreatitis type (HCC) Continue Creon with meals.  Keep  upcoming appointment with gastroenterologist to discuss having colonoscopy done.  5. Influenza vaccination declined Recommended.  Patient declined.     Patient was given the opportunity to ask questions.  Patient verbalized understanding of the plan and was able to repeat key elements of the plan.   This  documentation was completed using Paediatric nurse.  Any transcriptional errors are unintentional.  No orders of the defined types were placed in this encounter.    Requested Prescriptions   Pending Prescriptions Disp Refills   amLODipine (NORVASC) 10 MG tablet 90 tablet 1    Sig: Take 1 tablet (10 mg total) by mouth daily.   losartan (COZAAR) 100 MG tablet 90 tablet 1    Sig: Take 1 tablet (100 mg total) by mouth daily.   cyanocobalamin (VITAMIN B12) 1000 MCG tablet 90 tablet 2    Sig: Take 1 tablet (1,000 mcg total) by mouth daily.    No follow-ups on file.  Jonah Blue, MD, FACP

## 2023-07-08 NOTE — Patient Instructions (Signed)
You should be on both Losartan 100 mg and Amlodipine 10 mg daily for blood pressure.

## 2023-07-16 ENCOUNTER — Encounter: Payer: Self-pay | Admitting: Physician Assistant

## 2023-07-16 ENCOUNTER — Ambulatory Visit (INDEPENDENT_AMBULATORY_CARE_PROVIDER_SITE_OTHER): Payer: Medicaid Other | Admitting: Physician Assistant

## 2023-07-16 VITALS — BP 120/80 | HR 73 | Ht 70.0 in | Wt 154.2 lb

## 2023-07-16 DIAGNOSIS — Z1212 Encounter for screening for malignant neoplasm of rectum: Secondary | ICD-10-CM | POA: Diagnosis not present

## 2023-07-16 DIAGNOSIS — K8689 Other specified diseases of pancreas: Secondary | ICD-10-CM

## 2023-07-16 DIAGNOSIS — Z1211 Encounter for screening for malignant neoplasm of colon: Secondary | ICD-10-CM

## 2023-07-16 NOTE — Progress Notes (Signed)
Chief Complaint: Discuss Colonoscopy  HPI:    Mr. Joe [name] is a 62 year old African-American male, known to Dr. Leone Payor, with a past medical history as listed below including diabetes, who was referred to me by Marcine Matar, MD for consideration of colon cancer screening as well as a history of dilated pancreatic and bile duct.    07/04/2022 MRCP for dilated bile duct with increasing dilation of the main pancreatic duct with high duct parenchymal ratio, dilated sidebranches in the tail also increasingly dilated.  At that time studies at initial presentation in January 2020 suggest there may be underlying chronic process associated with ductal transition.  Findings suggested chronic pancreatitis with stricture.  Also suggestion of intraductal material near the site of the ductal transition.  Narrowing of the portal vein.  Stable appearance of biliary duct dilation.    10/03/2022 EGD and EUS to evaluate dilated pink attic duct.  At that time discussed that his colonoscopy in 2020 was done during hemorrhage and was unsuitable for polyp detection.  Recommended repeat screening.  At that time recommended repeat MRI/MRCP in 1 year.    01/03/2023 office visit with Dr. Leone Payor at that time doing well.  Had stool testing for H. pylori.  Continued on current therapy including Creon.  Discussed that he needed a repeat colonoscopy given that the last in 2020 was during hemorrhage and not good for polyp detection.    01/03/2023 labs including CMP and CBC were overall okay with some mild abnormalities which Dr. Leone Payor is not concerned about.  H. pylori infection was gone.  Recommended a screening colonoscopy at that point.  Apparently tried to contact him multiple times but never got in touch.    Today, patient presents to clinic and tells me he thought he was here for a cortisone injection in his shoulder.  As far as his GI symptoms he is doing well continues to Creon supplements with each of his meals, which is just  twice daily.  He really does not snack.  His bowel movements are formed and he has no abdominal pain.    Denies fever, chills or weight loss.  Previous GI procedures: EUS 10/03/2022   EGD impression: - No gross lesions in the proximal esophagus and in the mid esophagus. - Salmon-colored mucosal islands suspicious for short-segment Barrett's esophagus. Biopsied. - Erythematous mucosa in the stomach. Biopsied. - No gross lesions in the duodenal bulb, in the first portion of the duodenum and in the second portion of the duodenum. - Normal major papilla and minor papilla.  EUS impression: - Pancreatic parenchymal abnormalities consisting of atrophy, lobularity and hyperechoicstrands were noted in the entire pancreas. - The pancreatic duct had a dilated endosonographic appearance, had concern for an intraductal stone in the head of pancreas and had a strictured endosonographic appearance within the body of the pancreas. Duct dilation noted throughout (see above). - There was dilation in the common bile duct and in the common hepatic duct. There is gallbladder sludge but no evidence of stone disease within the biliary system - No malignant-appearing lymph nodes were visualized in the celiac region (level 20), peripancreatic region and porta hepatis region.   A. STOMACH, BIOPSY:  - Gastric antral and oxyntic mucosa with Helicobacter pylori-associated  gastritis  - Helicobacter pylori-like organisms were identified on routine HE  stain   B. ESOPHAGUS, DISTAL, BIOPSY:  - Esophageal squamous and cardiac mucosa with mild chronic nonspecific  carditis  - Negative for intestinal metaplasia or dysplasia  Past Medical History:  Diagnosis Date   Diabetes mellitus without complication (HCC)    H. 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 03/11/2019    Past  Surgical History:  Procedure Laterality Date   BIOPSY  10/03/2022   Procedure: BIOPSY;  Surgeon: Lemar Lofty., MD;  Location: [facility];  Service: Gastroenterology;;   COLONOSCOPY WITH PROPOFOL N/A 11/08/2018   Procedure: COLONOSCOPY WITH PROPOFOL;  Surgeon: Shellia Cleverly, DO;  Location: [facility];  Service: Gastroenterology;  Laterality: N/A;   ESOPHAGOGASTRODUODENOSCOPY (EGD) WITH PROPOFOL N/A 11/06/2018   Procedure: ESOPHAGOGASTRODUODENOSCOPY (EGD) WITH PROPOFOL;  Surgeon: Jeani Hawking, MD;  Location: [facility] ENDOSCOPY;  Service: Endoscopy;  Laterality: N/A;   ESOPHAGOGASTRODUODENOSCOPY (EGD) WITH PROPOFOL N/A 10/03/2022   Procedure: ESOPHAGOGASTRODUODENOSCOPY (EGD) WITH PROPOFOL;  Surgeon: Meridee Score Netty Starring., MD;  Location: [facility];  Service: Gastroenterology;  Laterality: N/A;   EUS  11/06/2018   Procedure: FULL UPPER ENDOSCOPIC ULTRASOUND (EUS) RADIAL;  Surgeon: Jeani Hawking, MD;  Location: [facility] ENDOSCOPY;  Service: Endoscopy;;   EUS N/A 10/03/2022   Procedure: UPPER ENDOSCOPIC ULTRASOUND (EUS) RADIAL;  Surgeon: Lemar Lofty., MD;  Location: [facility];  Service: Gastroenterology;  Laterality: N/A;   GIVENS CAPSULE STUDY N/A 11/08/2018   Procedure: GIVENS CAPSULE STUDY;  Surgeon: Shellia Cleverly, DO;  Location: [facility];  Service: Gastroenterology;  Laterality: N/A;   IR ANGIOGRAM SELECTIVE EACH ADDITIONAL VESSEL  11/10/2018   IR ANGIOGRAM VISCERAL SELECTIVE  11/10/2018   IR ANGIOGRAM VISCERAL SELECTIVE  11/10/2018   IR EMBO ART  VEN HEMORR LYMPH EXTRAV  INC GUIDE ROADMAPPING  11/10/2018   IR US GUIDE VASC ACCESS RIGHT  11/10/2018   KNEE SURGERY Bilateral 1985   Arthroscopic for torn cartilage and ligaments.     SHOULDER OPEN ROTATOR CUFF REPAIR Left 05/10/2020   Procedure: LEFT SHOULDER OPEN ROTATOR CUFF REPAIR AND BICEPS TENODESIS;  Surgeon: Tarry Kos, MD;  Location:  SUR[facility]ERY [facility]ENTER;  Service: Orthopedics;  Laterality: Left;    Current  Outpatient Medications  Medication Sig Dispense Refill   amLODipine (NORVASC) 10 MG tablet Take 1 tablet (10 mg total) by mouth daily. 90 tablet 1   Blood Pressure Monitor KIT Used to monitor blood pressure daily and as directed 1 each 0   cyanocobalamin (VITAMIN B12) 1000 MCG tablet Take 1 tablet (1,000 mcg total) by mouth daily. 90 tablet 2   lipase/protease/amylase (CREON) 36000 UNITS CPEP capsule Take 2 capsules (72,000 Units total) by mouth 3 (three) times daily with meals AND 1 capsule (36,000 Units total) with snacks. 240 capsule 11   losartan (COZAAR) 100 MG tablet Take 1 tablet (100 mg total) by mouth daily. 90 tablet 1   No current facility-administered medications for this visit.    Allergies as of 07/16/2023 - Review Complete 07/16/2023  Allergen Reaction Noted   Hctz [hydrochlorothiazide] Rash 01/16/2017    Family History  Problem Relation Age of Onset   Cancer Mother        uncertain what was wrong--mother would not share, but gradual decline.   Hypertension Mother    Hypertension Father     Social History   Socioeconomic History   Marital status: Media planner    Spouse name: April Holding   Number of children: 2   Years of education: 12   Highest education level: 12th grade  Occupational History   Occupation: Packing/stacking/loading    Comment: soaps, shaving implements, etc.  Tobacco Use   Smoking status: Never   Smokeless tobacco: Never  Vaping Use   Vaping status: Never Used  Substance and Sexual Activity   Alcohol use: Yes    Comment: 6 pack of beer daily   Drug use: No   Sexual activity: Yes  Other Topics Concern   Not on file  Social History Narrative   Works second shift at [employer]   Lives with his partner of 30+ years and adult daughter Karolee Stamps.   40 oz beer/day   No drug   Never smoker      Social Determinants of Corporate investment banker Strain: Not on file  Food Insecurity: Not on file  Transportation Needs: Not on  file  Physical Activity: Not on file  Stress: Not on file  Social Connections: Not on file  Intimate Partner Violence: Not At Risk (02/18/2018)   Humiliation, Afraid, Rape, and Kick questionnaire    Fear of Current or Ex-Partner: No    Emotionally Abused: No    Physically Abused: No    Sexually Abused: No    Review of Systems:    Constitutional: No weight loss, fever or chills Cardiovascular: No chest pain Respiratory: No SOB  Gastrointestinal: See HPI and otherwise negative   Physical Exam:  Vital signs: BP (!) 132/90   Pulse 73   Ht 5\' 10"  (1.778 m)   Wt 154 lb 3.2 oz (69.9 kg)   BMI 22.13 kg/m    Constitutional:   Pleasant AA male appears to be in NAD, Well developed, Well nourished, alert and cooperative Respiratory: Respirations even and unlabored. Lungs clear to auscultation bilaterally.   No wheezes, crackles, or rhonchi.  Cardiovascular: Normal S1, S2. No MRG. Regular rate and rhythm. No peripheral edema, cyanosis or pallor.  Gastrointestinal:  Soft, nondistended, nontender. No rebound or guarding. Normal bowel sounds. No appreciable masses or hepatomegaly. Rectal:  Not performed.  Psychiatric: Demonstrates good judgement and reason without abnormal affect or behaviors.  RELEVANT LABS AND IMAGING: CBC    Component Value Date/Time   WBC 3.6 (L) 01/03/2023 1126   RBC 4.99 01/03/2023 1126   HGB 13.7 01/03/2023 1126   HGB 12.1 (L) 08/29/2022 0945   HCT 42.0 01/03/2023 1126   HCT 36.4 (L) 08/29/2022 0945   PLT 282.0 01/03/2023 1126   PLT 262 08/29/2022 0945   MCV 84.2 01/03/2023 1126   MCV 82 08/29/2022 0945   MCH 27.1 08/29/2022 0945   MCH 16.4 (L) 10/16/2019 0858   MCHC 32.6 01/03/2023 1126   RDW 15.6 (H) 01/03/2023 1126   RDW 13.7 08/29/2022 0945   LYMPHSABS 0.7 01/03/2023 1126   LYMPHSABS 0.7 08/29/2022 0945   MONOABS 0.3 01/03/2023 1126   EOSABS 0.1 01/03/2023 1126   EOSABS 0.2 08/29/2022 0945   BASOSABS 0.0 01/03/2023 1126   BASOSABS 0.0 08/29/2022  0945    CMP     Component Value Date/Time   NA 145 (H) 03/04/2023 0902   K 4.2 03/19/2023 0927   CL 110 (H) 03/04/2023 0902   CO2 13 (L) 03/04/2023 0902   GLUCOSE 146 (H) 03/04/2023 0902   GLUCOSE 85 01/03/2023 1126   BUN 13 03/04/2023 0902   CREATININE 0.90 03/04/2023 0902   CALCIUM 9.1 03/04/2023 0902   PROT 7.2 01/03/2023 1126   PROT 7.1 03/19/2022 0953   ALBUMIN 4.2 01/03/2023 1126   ALBUMIN 4.4 03/19/2022 0953  AST 40 (H) 01/03/2023 1126   ALT 37 01/03/2023 1126   ALKPHOS 88 01/03/2023 1126   BILITOT 0.7 01/03/2023 1126   BILITOT 0.9 03/19/2022 0953   GFRNONAA 97 03/16/2020 0941   GFRAA 112 03/16/2020 0941    Assessment: 1.  Screening for colorectal cancer: Last colonoscopy in 2020 during hemorrhage and inadequate for polyp detection, repeat recommended now per recommendations from Dr. Leone Payor 2.  Dilated pancreatic duct: Previous EGD and EUS in November 2023 with repeat MRI/MRCP recommended in a year, patient doing well with no further symptoms  Plan: 1.  Place patient in recall for MRI/MRCP in November.  This is for dilated pancreatic duct. 2.  Scheduled patient for a screening colonoscopy in the [facility] with Dr. Leone Payor.  Did provide the patient with a detailed list of risks for the procedure and he agrees to proceed. Patient is appropriate for endoscopic procedure(s) in the ambulatory ([facility]) setting.  3.  Continue Creon supplementation. 4.  Patient to follow clinic with [place] as needed or as directed after time of colonoscopy.  Hyacinth Meeker, PA-C [facility] 07/16/2023, 1:23 PM  Cc: Marcine Matar, MD

## 2023-07-16 NOTE — Patient Instructions (Signed)
You have been scheduled for a colonoscopy. Please follow written instructions given to you at your visit today.   Please pick up your prep supplies at the pharmacy within the next 1-3 days.  If you use inhalers (even only as needed), please bring them with you on the day of your procedure.  DO NOT TAKE 7 DAYS PRIOR TO TEST- Trulicity (dulaglutide) Ozempic, Wegovy (semaglutide) Mounjaro (tirzepatide) Bydureon Bcise (exanatide extended release)  DO NOT TAKE 1 DAY PRIOR TO YOUR TEST Rybelsus (semaglutide) Adlyxin (lixisenatide) Victoza (liraglutide) Byetta (exanatide) ___________________________________________________________________________  We will contact you in October to set up your MRCP for November.   I appreciate the opportunity to care for you. Hyacinth Meeker, PA-C

## 2023-07-24 ENCOUNTER — Ambulatory Visit (AMBULATORY_SURGERY_CENTER): Payer: Medicaid Other | Admitting: Internal Medicine

## 2023-07-24 ENCOUNTER — Encounter: Payer: Self-pay | Admitting: Internal Medicine

## 2023-07-24 VITALS — BP 104/80 | HR 60 | Temp 97.8°F | Resp 15 | Ht 70.0 in | Wt 154.0 lb

## 2023-07-24 DIAGNOSIS — Z1211 Encounter for screening for malignant neoplasm of colon: Secondary | ICD-10-CM | POA: Diagnosis not present

## 2023-07-24 MED ORDER — SODIUM CHLORIDE 0.9 % IV SOLN: 500.0000 mL | Freq: Once | INTRAVENOUS | Status: DC

## 2023-07-24 NOTE — Op Note (Signed)
[facility] Patient Name: Joe [name] Procedure Date: 07/24/2023 2:22 PM MRN: [id number] Endoscopist: Iva Boop , MD, [id number] Age: 62 Referring MD:  Date of Birth: 01/14/1961 Gender: Male Account #: [id number] Procedure:                Colonoscopy Indications:              Screening for colorectal malignant neoplasm Medicines:                Monitored Anesthesia Care Procedure:                Pre-Anesthesia Assessment:                           - Prior to the procedure, a History and Physical                            was performed, and patient medications and                            allergies were reviewed. The patient's tolerance of                            previous anesthesia was also reviewed. The risks                            and benefits of the procedure and the sedation                            options and risks were discussed with the patient.                            All questions were answered, and informed consent                            was obtained. Prior Anticoagulants: The patient has                            taken no anticoagulant or antiplatelet agents. ASA                            Grade Assessment: III - A patient with severe                            systemic disease. After reviewing the risks and                            benefits, the patient was deemed in satisfactory                            condition to undergo the procedure.                           After obtaining informed consent, the colonoscope  was passed under direct vision. Throughout the                            procedure, the patient's blood pressure, pulse, and                            oxygen saturations were monitored continuously. The                            Olympus CF-HQ190L ([id number]) Colonoscope was                            introduced through the anus and advanced to the the                            cecum, identified  by appendiceal orifice and                            ileocecal valve. The colonoscopy was performed                            without difficulty. The patient tolerated the                            procedure well. The quality of the bowel                            preparation was adequate. The ileocecal valve,                            appendiceal orifice, and rectum were photographed. Scope In: 2:35:37 PM Scope Out: 2:49:22 PM Scope Withdrawal Time: 0 hours 10 minutes 37 seconds  Total Procedure Duration: 0 hours 13 minutes 45 seconds  Findings:                 The perianal and digital rectal examinations were                            normal. Pertinent negatives include normal prostate                            (size, shape, and consistency).                           The entire examined colon appeared normal on direct                            and retroflexion views. Complications:            No immediate complications. Estimated Blood Loss:     Estimated blood loss: none. Impression:               - The entire examined colon is normal on direct and  retroflexion views.                           - No specimens collected. Recommendation:           - Patient has a contact number available for                            emergencies. The signs and symptoms of potential                            delayed complications were discussed with the                            patient. Return to normal activities tomorrow.                            Written discharge instructions were provided to the                            patient.                           - Resume previous diet.                           - Continue present medications.                           - Repeat colonoscopy in 10 years for screening                            purposes.                           - Abstain form EtOH Iva Boop, MD 07/24/2023 2:55:28 PM This report has been signed  electronically.

## 2023-07-24 NOTE — Progress Notes (Signed)
Vss nad trans to pacu 

## 2023-07-24 NOTE — Patient Instructions (Addendum)
Resume previous diet. Continue present medications. Repeat colonoscopy in 10 years for screening purposes. Abstain from ETOH/alcohol.  YOU HAD AN ENDOSCOPIC PROCEDURE TODAY AT THE [facility]:   Refer to the procedure report that was given to you for any specific questions about what was found during the examination.  If the procedure report does not answer your questions, please call your gastroenterologist to clarify.  If you requested that your care partner not be given the details of your procedure findings, then the procedure report has been included in a sealed envelope for you to review at your convenience later.  YOU SHOULD EXPECT: Some feelings of bloating in the abdomen. Passage of more gas than usual.  Walking can help get rid of the air that was put into your GI tract during the procedure and reduce the bloating. If you had a lower endoscopy (such as a colonoscopy or flexible sigmoidoscopy) you may notice spotting of blood in your stool or on the toilet paper. If you underwent a bowel prep for your procedure, you may not have a normal bowel movement for a few days.  Please Note:  You might notice some irritation and congestion in your nose or some drainage.  This is from the oxygen used during your procedure.  There is no need for concern and it should clear up in a day or so.  SYMPTOMS TO REPORT IMMEDIATELY:  Following lower endoscopy (colonoscopy or flexible sigmoidoscopy):  Excessive amounts of blood in the stool  Significant tenderness or worsening of abdominal pains  Swelling of the abdomen that is new, acute  Fever of 100F or higher  For urgent or emergent issues, a gastroenterologist can be reached at any hour by calling [phone]. Do not use MyChart messaging for urgent concerns.    DIET:  We do recommend a small meal at first, but then you may proceed to your regular diet.  Drink plenty of fluids but you should avoid alcoholic beverages for 24  hours.  ACTIVITY:  You should plan to take it easy for the rest of today and you should NOT DRIVE or use heavy machinery until tomorrow (because of the sedation medicines used during the test).    FOLLOW UP: Our staff will call the number listed on your records the next business day following your procedure.  We will call around 7:15- 8:00 am to check on you and address any questions or concerns that you may have regarding the information given to you following your procedure. If we do not reach you, we will leave a message.     If any biopsies were taken you will be contacted by phone or by letter within the next 1-3 weeks.  Please call us at [phone] if you have not heard about the biopsies in 3 weeks.    SIGNATURES/CONFIDENTIALITY: You and/or your care partner have signed paperwork which will be entered into your electronic medical record.  These signatures attest to the fact that that the information above on your After Visit Summary has been reviewed and is understood.  Full responsibility of the confidentiality of this discharge information lies with you and/or your care-partner.No polyps or cancer seen - exam was normal.  Please remember that it is in your best interest to avoid alcohol completely. Continued use can lead to worsening of damage to pancreas and serious health problems.  Next routine colonoscopy or other screening test in 10 years - 2034.  I appreciate the opportunity to care for  you. Iva Boop, MD, [facility]

## 2023-07-24 NOTE — Progress Notes (Signed)
Pt's states no medical or surgical changes since previsit or office visit. 

## 2023-07-24 NOTE — Progress Notes (Signed)
History and Physical Interval Note:  07/24/2023 2:27 PM  Joe German Sabatino Sr.  has presented today for endoscopic procedure(s), with the diagnosis of  Encounter Diagnosis  Name Primary?   Screening for colorectal cancer Yes  .  The various methods of evaluation and treatment have been discussed with the patient and/or family. After consideration of risks, benefits and other options for treatment, the patient has consented to  the endoscopic procedure(s).   The patient's history has been reviewed, patient examined, no change in status, stable for endoscopic procedure(s).  I have reviewed the patient's chart and labs.  Questions were answered to the patient's satisfaction.     Iva Boop, MD, [facility]

## 2023-07-28 ENCOUNTER — Telehealth: Payer: Self-pay

## 2023-07-28 NOTE — Telephone Encounter (Signed)
  Follow up Call-     07/24/2023    1:43 PM  Call back number  Post procedure Call Back phone  # [phone]  Permission to leave phone message Yes     Patient questions:  Do you have a fever, pain , or abdominal swelling? No. Pain Score  0 *  Have you tolerated food without any problems? Yes.    Have you been able to return to your normal activities? No.  Do you have any questions about your discharge instructions: Diet   No. Medications  No. Follow up visit  No.  Do you have questions or concerns about your Care? No.  Actions: * If pain score is 4 or above: No action needed, pain <4.  Pt states that he had additional diarrhea after the procedure. States no pain and that the diarrhea has now resolved. No other problems.

## 2023-08-08 ENCOUNTER — Ambulatory Visit: Payer: Medicaid Other | Admitting: Pharmacist

## 2023-08-12 ENCOUNTER — Ambulatory Visit (INDEPENDENT_AMBULATORY_CARE_PROVIDER_SITE_OTHER): Payer: Medicaid Other | Admitting: Orthopaedic Surgery

## 2023-08-12 DIAGNOSIS — M12812 Other specific arthropathies, not elsewhere classified, left shoulder: Secondary | ICD-10-CM

## 2023-08-12 NOTE — Addendum Note (Signed)
Addended by: Wendi Maya on: 08/12/2023 08:48 AM   Modules accepted: Orders

## 2023-08-12 NOTE — Progress Notes (Signed)
Office Visit Note   Patient: Joe ALPERS Sr.           Date of Birth: 08-Jan-1961           MRN: [id number] Visit Date: 08/12/2023              Requested by: Marcine Matar, MD [address] [place] [place],  [place] [address] PCP: Marcine Matar, MD   Assessment & Plan: Visit Diagnoses:  1. Rotator cuff arthropathy of left shoulder     Plan: Joe [name] is a 62 year old gentleman with an irreparable left rotator cuff tear.  He has functional range of motion but lacks strength overhead.  Has been getting periodic injections with temporary relief.  His last injection was about a year ago.  We will send him to Dr. Shon Baton for another 1.  I will place him on light duty permanently.  Follow-Up Instructions: No follow-ups on file.   Orders:  No orders of the defined types were placed in this encounter.  No orders of the defined types were placed in this encounter.     Procedures: No procedures performed   Clinical Data: No additional findings.   Subjective: Chief Complaint  Patient presents with   Left Shoulder - Pain    HPI Patient is a 62 year old gentleman who comes back today for left shoulder pain.  He has irreparable rotator cuff tear.  He has been evaluated by Dr. August Saucer.  He is too young for reverse.  Currently on light duty. Review of Systems  Constitutional: Negative.   HENT: Negative.    Eyes: Negative.   Respiratory: Negative.    Cardiovascular: Negative.   Gastrointestinal: Negative.   Endocrine: Negative.   Genitourinary: Negative.   Skin: Negative.   Allergic/Immunologic: Negative.   Neurological: Negative.   Hematological: Negative.   Psychiatric/Behavioral: Negative.    All other systems reviewed and are negative.    Objective: Vital Signs: There were no vitals taken for this visit.  Physical Exam Vitals and nursing note reviewed.  Constitutional:      Appearance: He is well-developed.  Pulmonary:     Effort: Pulmonary effort  is normal.  Abdominal:     Palpations: Abdomen is soft.  Skin:    General: Skin is warm.  Neurological:     Mental Status: He is alert and oriented to person, place, and time.  Psychiatric:        Behavior: Behavior normal.        Thought Content: Thought content normal.        Judgment: Judgment normal.     Ortho Exam Exam of the left shoulder is unchanged. Specialty Comments:  No specialty comments available.  Imaging: No results found.   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   Hypertension Mother    Hypertension Father     Past Surgical History:  Procedure Laterality Date   BIOPSY  10/03/2022   Procedure: BIOPSY;  Surgeon: Meridee Score Netty Starring., MD;  Location: [facility];  Service: Gastroenterology;;   COLONOSCOPY WITH PROPOFOL N/A 11/08/2018   Procedure: COLONOSCOPY WITH PROPOFOL;  Surgeon:  Shellia Cleverly, DO;  Location: [facility];  Service: Gastroenterology;  Laterality: N/A;   ESOPHAGOGASTRODUODENOSCOPY (EGD) WITH PROPOFOL N/A 11/06/2018   Procedure: ESOPHAGOGASTRODUODENOSCOPY (EGD) WITH PROPOFOL;  Surgeon: Jeani Hawking, MD;  Location: [facility];  Service: Endoscopy;  Laterality: N/A;   ESOPHAGOGASTRODUODENOSCOPY (EGD) WITH PROPOFOL N/A 10/03/2022   Procedure: ESOPHAGOGASTRODUODENOSCOPY (EGD) WITH PROPOFOL;  Surgeon: Meridee Score Netty Starring., MD;  Location: [facility];  Service: Gastroenterology;  Laterality: N/A;   EUS  11/06/2018   Procedure: FULL UPPER ENDOSCOPIC ULTRASOUND (EUS) RADIAL;  Surgeon: Jeani Hawking, MD;  Location: [facility];  Service: Endoscopy;;   EUS N/A 10/03/2022   Procedure: UPPER ENDOSCOPIC ULTRASOUND (EUS) RADIAL;  Surgeon: Lemar Lofty., MD;  Location: [facility];  Service: Gastroenterology;  Laterality: N/A;   GIVENS CAPSULE STUDY N/A 11/08/2018   Procedure: GIVENS CAPSULE STUDY;  Surgeon: Shellia Cleverly, DO;  Location: [facility];  Service: Gastroenterology;  Laterality: N/A;   IR ANGIOGRAM SELECTIVE EACH ADDITIONAL VESSEL  11/10/2018   IR ANGIOGRAM VISCERAL SELECTIVE  11/10/2018   IR ANGIOGRAM VISCERAL SELECTIVE  11/10/2018   IR EMBO ART  VEN HEMORR LYMPH EXTRAV  INC GUIDE ROADMAPPING  11/10/2018   IR US GUIDE VASC ACCESS RIGHT  11/10/2018   KNEE SURGERY Bilateral 1985   Arthroscopic for torn cartilage and ligaments.     SHOULDER OPEN ROTATOR CUFF REPAIR Left 05/10/2020   Procedure: LEFT SHOULDER OPEN ROTATOR CUFF REPAIR AND BICEPS TENODESIS;  Surgeon: Tarry Kos, MD;  Location: [facility];  Service: Orthopedics;  Laterality: Left;   Social History   Occupational History   Occupation: Packing/stacking/loading    Comment: soaps, shaving implements, etc.  Tobacco Use   Smoking status: Never   Smokeless tobacco: Never  Vaping Use   Vaping status: Never Used  Substance and Sexual Activity   Alcohol use: Yes     Comment: 6 pack of beer daily   Drug use: No   Sexual activity: Yes

## 2023-08-19 ENCOUNTER — Telehealth: Payer: Self-pay | Admitting: Orthopaedic Surgery

## 2023-08-19 ENCOUNTER — Encounter: Payer: Self-pay | Admitting: Sports Medicine

## 2023-08-19 ENCOUNTER — Ambulatory Visit: Payer: Medicaid Other | Admitting: Sports Medicine

## 2023-08-19 ENCOUNTER — Other Ambulatory Visit: Payer: Self-pay

## 2023-08-19 DIAGNOSIS — G8929 Other chronic pain: Secondary | ICD-10-CM | POA: Diagnosis not present

## 2023-08-19 DIAGNOSIS — M25512 Pain in left shoulder: Secondary | ICD-10-CM | POA: Diagnosis not present

## 2023-08-19 DIAGNOSIS — M12812 Other specific arthropathies, not elsewhere classified, left shoulder: Secondary | ICD-10-CM | POA: Diagnosis not present

## 2023-08-19 MED ORDER — METHYLPREDNISOLONE ACETATE 40 MG/ML IJ SUSP
80.0000 mg | INTRAMUSCULAR | Status: AC | PRN
Start: 2023-08-19 — End: 2023-08-19
  Administered 2023-08-19: 80 mg via INTRA_ARTICULAR

## 2023-08-19 MED ORDER — BUPIVACAINE HCL 0.25 % IJ SOLN
2.0000 mL | INTRAMUSCULAR | Status: AC | PRN
Start: 2023-08-19 — End: 2023-08-19
  Administered 2023-08-19: 2 mL via INTRA_ARTICULAR

## 2023-08-19 MED ORDER — LIDOCAINE HCL 1 % IJ SOLN
2.0000 mL | INTRAMUSCULAR | Status: AC | PRN
Start: 2023-08-19 — End: 2023-08-19
  Administered 2023-08-19: 2 mL

## 2023-08-19 NOTE — Telephone Encounter (Signed)
Pt called in about paperwork he dropped off last week advised Lauren G about it they have the paperwork Roda Shutters has not completed it yet and pt will get a call when it is ready to be picked up

## 2023-08-19 NOTE — Progress Notes (Addendum)
Procedure Note  Patient: Joe WOLIVER Sr.             Date of Birth: 1961-10-30           MRN: [id number]             Visit Date: 08/19/2023  Procedures: Visit Diagnoses:  1. Chronic left shoulder pain   2. Rotator cuff arthropathy of left shoulder    Large Joint Inj: L glenohumeral on 08/19/2023 9:30 AM Indications: pain Details: 22 G 3.5 in needle, ultrasound-guided posterior approach Medications: 2 mL lidocaine 1 %; 2 mL bupivacaine 0.25 %; 80 mg methylPREDNISolone acetate 40 MG/ML Outcome: tolerated well, no immediate complications  US-guided glenohumeral joint injection, left shoulder After discussion on risks/benefits/indications, informed verbal consent was obtained. A timeout was then performed. The patient was positioned lying lateral recumbent on examination table. The patient's shoulder was prepped with betadine and multiple alcohol swabs and utilizing ultrasound guidance, the patient's glenohumeral joint was identified on ultrasound. Using ultrasound guidance a 22-gauge, 3.5 inch needle with a mixture of 2:2:2 cc's lidocaine:bupivicaine:depomedrol was directed from a lateral to medial direction via in-plane technique into the glenohumeral joint with visualization of appropriate spread of injectate into the joint. Patient tolerated the procedure well without immediate complications.      Procedure, treatment alternatives, risks and benefits explained, specific risks discussed. Consent was given by the patient. Immediately prior to procedure a time out was called to verify the correct patient, procedure, equipment, support staff and site/side marked as required. Patient was prepped and draped in the usual sterile fashion.     - Procedure and evaluation with performed with Dr. Brenton Grills - We evaluated the patient about 5 minutes post-injection and he had improvement in pain and range of motion - follow-up with Dr. Roda Shutters as indicated; I am happy to see them as needed - can  consider repeat injection infrequently as desires, no sooner than 3 months apart  Madelyn Brunner, DO Primary Care Sports Medicine Physician  [facility] - Orthopedics  This note was dictated using Dragon naturally speaking software and may contain errors in syntax, spelling, or content which have not been identified prior to signing this note.

## 2023-08-28 ENCOUNTER — Telehealth: Payer: Self-pay

## 2023-08-28 DIAGNOSIS — K8689 Other specified diseases of pancreas: Secondary | ICD-10-CM

## 2023-08-28 NOTE — Telephone Encounter (Signed)
-----   Message from [facility] Cassoday J sent at 07/16/2023  2:52 PM EDT ----- Call him and ask good days in Nov to go for his MRCP abdomin w/wo contrast for his dilated pancreatic duct

## 2023-08-28 NOTE — Telephone Encounter (Signed)
Left him a detailed message to call me back and let me know what days /times are good for him to get his MRCP.

## 2023-08-29 ENCOUNTER — Encounter: Payer: Self-pay | Admitting: Pharmacist

## 2023-08-29 ENCOUNTER — Ambulatory Visit: Payer: Medicaid Other | Attending: Nurse Practitioner | Admitting: Pharmacist

## 2023-08-29 VITALS — BP 134/79 | HR 72

## 2023-08-29 DIAGNOSIS — I10 Essential (primary) hypertension: Secondary | ICD-10-CM | POA: Diagnosis present

## 2023-08-29 DIAGNOSIS — K529 Noninfective gastroenteritis and colitis, unspecified: Secondary | ICD-10-CM | POA: Diagnosis not present

## 2023-08-29 DIAGNOSIS — D649 Anemia, unspecified: Secondary | ICD-10-CM | POA: Insufficient documentation

## 2023-08-29 DIAGNOSIS — M5442 Lumbago with sciatica, left side: Secondary | ICD-10-CM | POA: Insufficient documentation

## 2023-08-29 NOTE — Progress Notes (Signed)
   S:    PCP: Dr. Laural Benes   No chief complaint on file.  Joe German Hosang Sr. is a 62 y.o. male who presents for hypertension evaluation, education, and management. 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 Patient was referred and last seen by Primary Care Provider, Dr. Laural Benes on 07/08/23. BP was 165/102 at that visit. Of note, pt thought he was supposed to stop amlodipine and was not taking it. Only taking losartan prior to that visit. Amlodipine was restarted and losartan continued.   Today, patient arrives in good spirits and presents w/o assistance. Denies dizziness, headache, blurred vision, swelling and reports no concerns about his new prescription.  Family history: - Mother (deceased): cancer, HTN - Father (deceased): HTN  Social history: Non-smoker, patient reports not consuming alcohol.  Medication adherence optimal. Patient has taken all BP medications today (0730). Current antihypertensives include: losartan 100 mg, amlodipine 10 mg  Reported home BP readings: None; BP cuff at home is broken.  Patient reported dietary habits: The patient has had no changes in diet since the last visit. He decreased his salt intake before and is still monitoring salt intake. The patient does not regularly consume caffeinated beverages.  Patient-reported exercise habits: The patient does resistance training every day for 15 - 20 minutes and is pretty active at his job via walking.  ASCVD risk factors include: HTN   O:  Vitals:   08/29/23 1033  BP: 134/79  Pulse: 72    Last 3 Office BP readings: BP Readings from Last 3 Encounters:  08/29/23 134/79  07/24/23 104/80  07/16/23 120/80   BMET    Component Value Date/Time   NA 145 (H) 03/04/2023 0902   K 4.2 03/19/2023 0927   CL 110 (H) 03/04/2023 0902   CO2 13 (L) 03/04/2023 0902   GLUCOSE 146 (H) 03/04/2023 0902   GLUCOSE 85 01/03/2023 1126   BUN 13 03/04/2023  0902   CREATININE 0.90 03/04/2023 0902   CALCIUM 9.1 03/04/2023 0902   GFRNONAA 97 03/16/2020 0941   GFRAA 112 03/16/2020 0941   Renal function: CrCl cannot be calculated (Patient's most recent lab result is older than the maximum 21 days allowed.).  Clinical ASCVD: No  The ASCVD Risk score (Arnett DK, et al., 2019) failed to calculate for the following reasons:   The valid total cholesterol range is 130 to 320 mg/dL  A/P: Hypertension is currently controlled on current medications. BP goal < 130/80 mmHg. Medication adherence appears optimal. -No medication changes today.  -Counseled on lifestyle modifications for blood pressure control including reduced dietary sodium, increased exercise, adequate sleep. -Encouraged patient to check BP at home and bring log of readings to next visit. Counseled on proper use of home BP cuff.   Results reviewed and written information provided. Patient verbalized understanding of treatment plan. Total time in face-to-face counseling 15 minutes.   Next visit is with PCP Dr. Laural Benes on 11/13/2023.  Butch Penny, PharmD, [facility] [place], CPP Clinical Pharmacist [facility] [phone]

## 2023-09-02 NOTE — Telephone Encounter (Signed)
I have Joe [name] set up for his MRI MRCP Abdomin w/wo contrast for November 1st at 8:00AM, Arrive at [facility] at 7:30AM. NPO 4 hours. I left him a detailed message on his cell # and I also left the date /time with his wife Joe [name] at home # . He said it was ok to call his home #.

## 2023-09-02 NOTE — Telephone Encounter (Signed)
Left him another detailed message to please call me back.

## 2023-09-05 ENCOUNTER — Ambulatory Visit (HOSPITAL_COMMUNITY): Payer: Medicaid Other

## 2023-09-09 ENCOUNTER — Ambulatory Visit (HOSPITAL_COMMUNITY)
Admission: RE | Admit: 2023-09-09 | Discharge: 2023-09-09 | Disposition: A | Discharge status: Home / Self Care | Payer: Medicaid Other | Source: Ambulatory Visit | Attending: Internal Medicine | Admitting: Internal Medicine

## 2023-09-09 ENCOUNTER — Other Ambulatory Visit: Payer: Self-pay | Admitting: Internal Medicine

## 2023-09-09 DIAGNOSIS — K8689 Other specified diseases of pancreas: Secondary | ICD-10-CM

## 2023-09-09 MED ORDER — GADOBUTROL 1 MMOL/ML IV SOLN
6.0000 mL | Freq: Once | INTRAVENOUS | Status: AC | PRN
Start: 2023-09-09 — End: 2023-09-09
  Administered 2023-09-09: 6 mL via INTRAVENOUS

## 2023-09-14 ENCOUNTER — Encounter: Payer: Self-pay | Admitting: Internal Medicine

## 2023-09-15 ENCOUNTER — Other Ambulatory Visit: Payer: Self-pay

## 2023-09-15 DIAGNOSIS — K861 Other chronic pancreatitis: Secondary | ICD-10-CM

## 2023-09-15 DIAGNOSIS — K8689 Other specified diseases of pancreas: Secondary | ICD-10-CM

## 2023-09-16 ENCOUNTER — Other Ambulatory Visit (INDEPENDENT_AMBULATORY_CARE_PROVIDER_SITE_OTHER): Payer: Medicaid Other

## 2023-09-16 DIAGNOSIS — K861 Other chronic pancreatitis: Secondary | ICD-10-CM

## 2023-09-16 DIAGNOSIS — K8689 Other specified diseases of pancreas: Secondary | ICD-10-CM | POA: Diagnosis not present

## 2023-09-16 LAB — HEPATIC FUNCTION PANEL
ALT: 53 U/L (ref 0–53)
AST: 58 U/L — ABNORMAL HIGH (ref 0–37)
Albumin: 4.2 g/dL (ref 3.5–5.2)
Alkaline Phosphatase: 82 U/L (ref 39–117)
Bilirubin, Direct: 0.3 mg/dL (ref 0.0–0.3)
Total Bilirubin: 1.3 mg/dL — ABNORMAL HIGH (ref 0.2–1.2)
Total Protein: 6.9 g/dL (ref 6.0–8.3)

## 2023-10-09 ENCOUNTER — Telehealth: Payer: Self-pay

## 2023-10-09 NOTE — Telephone Encounter (Signed)
Patient would like a note stating he can resume regular duty; no more light duty.

## 2023-10-09 NOTE — Telephone Encounter (Signed)
Okay for note

## 2023-10-09 NOTE — Telephone Encounter (Signed)
Yes

## 2023-10-09 NOTE — Telephone Encounter (Signed)
Note made. Called patient. Ready to pick up at the front desk.

## 2023-11-13 ENCOUNTER — Other Ambulatory Visit: Payer: Self-pay

## 2023-11-13 ENCOUNTER — Ambulatory Visit: Payer: Medicaid Other | Attending: Internal Medicine | Admitting: Internal Medicine

## 2023-11-13 VITALS — BP 148/94 | HR 63 | Temp 97.9°F | Ht 70.0 in | Wt 163.0 lb

## 2023-11-13 DIAGNOSIS — Z79899 Other long term (current) drug therapy: Secondary | ICD-10-CM | POA: Insufficient documentation

## 2023-11-13 DIAGNOSIS — I10 Essential (primary) hypertension: Secondary | ICD-10-CM | POA: Diagnosis present

## 2023-11-13 DIAGNOSIS — K760 Fatty (change of) liver, not elsewhere classified: Secondary | ICD-10-CM | POA: Insufficient documentation

## 2023-11-13 DIAGNOSIS — K861 Other chronic pancreatitis: Secondary | ICD-10-CM | POA: Diagnosis not present

## 2023-11-13 DIAGNOSIS — E538 Deficiency of other specified B group vitamins: Secondary | ICD-10-CM

## 2023-11-13 DIAGNOSIS — K859 Acute pancreatitis without necrosis or infection, unspecified: Secondary | ICD-10-CM | POA: Insufficient documentation

## 2023-11-13 DIAGNOSIS — D649 Anemia, unspecified: Secondary | ICD-10-CM

## 2023-11-13 MED ORDER — LOSARTAN POTASSIUM 100 MG PO TABS
100.0000 mg | ORAL_TABLET | Freq: Every day | ORAL | 1 refills | Status: AC
  Filled 2023-11-13: qty 90, 90d supply, fill #0
  Filled 2024-04-09: qty 90, 90d supply, fill #1

## 2023-11-13 MED ORDER — AMLODIPINE BESYLATE 10 MG PO TABS
10.0000 mg | ORAL_TABLET | Freq: Every day | ORAL | 1 refills | Status: AC
  Filled 2023-11-13 – 2024-04-09 (×2): qty 90, 90d supply, fill #0

## 2023-11-13 MED ORDER — HYDRALAZINE HCL 10 MG PO TABS
10.0000 mg | ORAL_TABLET | Freq: Two times a day (BID) | ORAL | 1 refills | Status: DC
  Filled 2023-11-13: qty 180, 90d supply, fill #0
  Filled 2024-04-09: qty 180, 90d supply, fill #1

## 2023-11-13 NOTE — Progress Notes (Addendum)
 Patient ID: Joe GORMAN Robert Sr., male    DOB: 11/11/1960  MRN: [id number]  CC: Hypertension (HTN f/u. /No questions / concerns/No to flu vax.)   Subjective: Joe [name] is a 63 y.o. male who presents for chronic ds management. Mary, his significant other is with him.  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 Avram and had colonoscopy that was normal. -No recent acute pancreatic flare.  He continues to take Creon  3 times a day with meals and lower dose with snacks. -Taking vitamin B12 1000 mcg daily; out of it for 1 mth; did not realize that he still has RF.  HYPERTENSION Currently taking: see medication list.  Should be on Cozaar  100 mg daily and amlodipine  10 mg daily. Med Adherence: [x]  Yes and took this a.m. Medication side effects: []  Yes    [x]  No Adherence with salt restriction: [x]  Yes    []  No Home Monitoring?: []  Yes    [x]  No, misplaced his device Monitoring Frequency:  Home BP results range:  SOB? []  Yes    [x]  No Chest Pain?: []  Yes    [x]  No Leg swelling?: []  Yes    [x]  No Headaches?: []  Yes    [x]  No Dizziness? []  Yes    [x]  No Comments:   Patient Active Problem List   Diagnosis Date Noted   Neutropenia, unspecified type (HCC) 02/25/2023   Vitamin B12 deficiency 07/31/2022   Hepatic steatosis 07/31/2022   Mouth lesion 07/19/2021   Unexplained weight loss 07/19/2021   Chronic diarrhea 07/19/2021   Normocytic anemia 07/19/2021   Full thickness tear of left subscapularis tendon 04/12/2020   Traumatic tear of supraspinatus tendon of  left shoulder 04/12/2020   Pseudoaneurysm of intra-abdominal artery (HCC) - gastroduodenal w/ pancreatic mass - resolved after embolization 03/11/2019   Mass of pancreas 03/11/2019   Chronic pancreatitis (HCC)    Microcytic anemia    Chronic left-sided low back pain with left-sided sciatica 07/08/2018   Hypertension 11/04/2014     Current Outpatient Medications on File Prior to Visit  Medication Sig Dispense Refill   amLODipine  (NORVASC ) 10 MG tablet Take 1 tablet (10 mg total) by mouth daily. 90 tablet 1   Blood Pressure Monitor KIT Used to monitor blood pressure daily and as directed 1 each 0   cyanocobalamin  (VITAMIN B12) 1000 MCG tablet Take 1 tablet (1,000 mcg total) by mouth daily. 90 tablet 2   lipase/protease/amylase (CREON ) 36000 UNITS CPEP capsule Take 2 capsules (72,000 Units total) by mouth 3 (three) times daily with meals AND 1 capsule (36,000 Units total) with snacks. 240 capsule 11   losartan  (COZAAR ) 100 MG tablet Take 1 tablet (100 mg total) by mouth daily. 90 tablet 1   No current facility-administered medications on file prior to visit.    Allergies  Allergen Reactions   Hctz [Hydrochlorothiazide] Rash    Social History   Socioeconomic History   Marital status: Media Planner    Spouse name: Ronal Pounds   Number of children: 2   Years of education: 12   Highest education level: 12th grade  Occupational History  Occupation: Packing/stacking/loading    Comment: soaps, shaving implements, etc.  Tobacco Use   Smoking status: Never   Smokeless tobacco: Never  Vaping Use   Vaping status: Never Used  Substance and Sexual Activity   Alcohol use: Yes    Comment: 6 pack of beer daily   Drug use: No   Sexual activity: Yes  Other Topics Concern   Not on file  Social History Narrative   Works second shift at [employer]   Lives with his partner of 30+ years and adult daughter Hart.   40 oz beer/day   No drug   Never smoker      Social Drivers  of Corporate Investment Banker Strain: Not on file  Food Insecurity: Food Insecurity Present (11/13/2023)   Hunger Vital Sign    Worried About Running Out of Food in the Last Year: Sometimes true    Ran Out of Food in the Last Year: Sometimes true  Transportation Needs: No Transportation Needs (11/13/2023)   PRAPARE - Administrator, Civil Service (Medical): No    Lack of Transportation (Non-Medical): No  Physical Activity: Not on file  Stress: Not on file  Social Connections: Not on file  Intimate Partner Violence: Not At Risk (11/13/2023)   Humiliation, Afraid, Rape, and Kick questionnaire    Fear of Current or Ex-Partner: No    Emotionally Abused: No    Physically Abused: No    Sexually Abused: No    Family History  Problem Relation Age of Onset   Cancer Mother        uncertain what was wrong--mother would not share, but gradual decline.   Hypertension Mother    Hypertension Father     Past Surgical History:  Procedure Laterality Date   BIOPSY  10/03/2022   Procedure: BIOPSY;  Surgeon: Wilhelmenia Aloha Raddle., MD;  Location: [facility];  Service: Gastroenterology;;   COLONOSCOPY WITH PROPOFOL  N/A 11/08/2018   Procedure: COLONOSCOPY WITH PROPOFOL ;  Surgeon: San Sandor GAILS, DO;  Location: [facility];  Service: Gastroenterology;  Laterality: N/A;   ESOPHAGOGASTRODUODENOSCOPY (EGD) WITH PROPOFOL  N/A 11/06/2018   Procedure: ESOPHAGOGASTRODUODENOSCOPY (EGD) WITH PROPOFOL ;  Surgeon: Rollin Dover, MD;  [facility];  Service: Endoscopy;  Laterality: N/A;   ESOPHAGOGASTRODUODENOSCOPY (EGD) WITH PROPOFOL  N/A 10/03/2022   Procedure: ESOPHAGOGASTRODUODENOSCOPY (EGD) WITH PROPOFOL ;  Surgeon: Wilhelmenia Aloha Raddle., MD;  Location: [facility];  Service: Gastroenterology;  Laterality: N/A;   EUS  11/06/2018   Procedure: FULL UPPER ENDOSCOPIC ULTRASOUND (EUS) RADIAL;  Surgeon: Rollin Dover, MD;  Location: [facility];  Service: Endoscopy;;   EUS N/A 10/03/2022    Procedure: UPPER ENDOSCOPIC ULTRASOUND (EUS) RADIAL;  Surgeon: Wilhelmenia Aloha Raddle., MD;  Location: [facility];  Service: Gastroenterology;  Laterality: N/A;   GIVENS CAPSULE STUDY N/A 11/08/2018   Procedure: GIVENS CAPSULE STUDY;  Surgeon: San Sandor GAILS, DO;  Location: [facility];  Service: Gastroenterology;  Laterality: N/A;   IR ANGIOGRAM SELECTIVE EACH ADDITIONAL VESSEL  11/10/2018   IR ANGIOGRAM VISCERAL SELECTIVE  11/10/2018   IR ANGIOGRAM VISCERAL SELECTIVE  11/10/2018   IR EMBO ART  VEN HEMORR LYMPH EXTRAV  INC GUIDE ROADMAPPING  11/10/2018   IR US  GUIDE VASC ACCESS RIGHT  11/10/2018   KNEE SURGERY Bilateral 1985   Arthroscopic for torn cartilage and ligaments.     SHOULDER OPEN ROTATOR CUFF REPAIR Left 05/10/2020   Procedure: LEFT SHOULDER OPEN ROTATOR CUFF REPAIR AND BICEPS TENODESIS;  Surgeon: Jerri Kay HERO, MD;  Location: [facility];  Service: Orthopedics;  Laterality: Left;    ROS: Review of Systems Negative except as stated above  PHYSICAL EXAM: BP (!) 148/94 (BP Location: Left Arm, Patient Position: Sitting, Cuff Size: Normal)   Pulse 63   Temp 97.9 F (36.6 C) (Oral)   Ht 5' 10 (1.778 m)   Wt 163 lb (73.9 kg)   SpO2 100%   BMI 23.39 kg/m   Wt Readings from Last 3 Encounters:  11/13/23 163 lb (73.9 kg)  07/24/23 154 lb (69.9 kg)  07/16/23 154 lb 3.2 oz (69.9 kg)  BP 155/109  Physical Exam   General appearance - alert, well appearing, and in no distress Mental status - normal mood, behavior, speech, dress, motor activity, and thought processes Neck - supple, no significant adenopathy Chest - clear to auscultation, no wheezes, rales or rhonchi, symmetric air entry Heart - normal rate, regular rhythm, normal S1, S2, no murmurs, rubs, clicks or gallops Extremities - peripheral pulses normal, no pedal edema, no clubbing or cyanosis     Latest Ref Rng & Units 09/16/2023    9:10 AM 03/19/2023    9:27 AM 03/04/2023    9:02 AM  CMP  Glucose 70 - 99  mg/dL   853   BUN 8 - 27 mg/dL   13   Creatinine 9.23 - 1.27 mg/dL   9.09   Sodium 865 - 855 mmol/L   145   Potassium 3.5 - 5.2 mmol/L  4.2  5.3   Chloride 96 - 106 mmol/L   110   CO2 20 - 29 mmol/L   13   Calcium 8.6 - 10.2 mg/dL   9.1   Total Protein 6.0 - 8.3 g/dL 6.9     Total Bilirubin 0.2 - 1.2 mg/dL 1.3     Alkaline Phos 39 - 117 U/L 82     AST 0 - 37 U/L 58     ALT 0 - 53 U/L 53      Lipid Panel     Component Value Date/Time   CHOL 108 02/18/2022 0917   TRIG 45 02/18/2022 0917   HDL 66 02/18/2022 0917   CHOLHDL 1.6 02/18/2022 0917   LDLCALC 30 02/18/2022 0917    CBC    Component Value Date/Time   WBC 3.6 (L) 01/03/2023 1126   RBC 4.99 01/03/2023 1126   HGB 13.7 01/03/2023 1126   HGB 12.1 (L) 08/29/2022 0945   HCT 42.0 01/03/2023 1126   HCT 36.4 (L) 08/29/2022 0945   PLT 282.0 01/03/2023 1126   PLT 262 08/29/2022 0945   MCV 84.2 01/03/2023 1126   MCV 82 08/29/2022 0945   MCH 27.1 08/29/2022 0945   MCH 16.4 (L) 10/16/2019 0858   MCHC 32.6 01/03/2023 1126   RDW 15.6 (H) 01/03/2023 1126   RDW 13.7 08/29/2022 0945   LYMPHSABS 0.7 01/03/2023 1126   LYMPHSABS 0.7 08/29/2022 0945   MONOABS 0.3 01/03/2023 1126   EOSABS 0.1 01/03/2023 1126   EOSABS 0.2 08/29/2022 0945   BASOSABS 0.0 01/03/2023 1126   BASOSABS 0.0 08/29/2022 0945    ASSESSMENT AND PLAN: 1. Essential hypertension Not at goal. DASH encouraged.  Add Hydralazine  10 mg BID Rxn given for a home BP device.  Check BP 2x/wk a record.  See clinical pharmacist in 1 mth - losartan  (COZAAR ) 100 MG tablet; Take 1 tablet (100 mg total) by mouth daily.  Dispense: 90 tablet; Refill: 1 - amLODipine  (NORVASC ) 10 MG tablet; Take 1  tablet (10 mg total) by mouth daily.  Dispense: 90 tablet; Refill: 1 - CBC - Basic metabolic panel - Lipid panel - hydrALAZINE  (APRESOLINE ) 10 MG tablet; Take 1 tablet (10 mg total) by mouth in the morning and at bedtime.  Dispense: 180 tablet; Refill: 1 - For home use only DME Other  see comment  2. Chronic pancreatitis, unspecified pancreatitis type (HCC) Stable.  No chronic pain. Continue Creon .    3. Vitamin B12 deficiency (Primary) Continue B12 supplement.  Advised patient that he still has refill on current prescription and should pick it up today. - Vitamin B12   Addendum: Hemoglobin is in the low normal range.  Will add iron studies    Patient was given the opportunity to ask questions.  Patient verbalized understanding of the plan and was able to repeat key elements of the plan.   This documentation was completed using Paediatric nurse.  Any transcriptional errors are unintentional.  No orders of the defined types were placed in this encounter.    Requested Prescriptions    No prescriptions requested or ordered in this encounter    No follow-ups on file.  Barnie Louder, MD, FACP

## 2023-11-13 NOTE — Patient Instructions (Signed)
 Continue Losartan and Amlodipine.  Add Hydralazine 10 mg twice a day.  Check Blood pressure twice a week.  Get refill on Vitamin B 12 from pharmacy

## 2023-11-14 ENCOUNTER — Other Ambulatory Visit: Payer: Self-pay

## 2023-11-14 LAB — BASIC METABOLIC PANEL
BUN/Creatinine Ratio: 15 (ref 10–24)
BUN: 15 mg/dL (ref 8–27)
CO2: 19 mmol/L — ABNORMAL LOW (ref 20–29)
Calcium: 9 mg/dL (ref 8.6–10.2)
Chloride: 110 mmol/L — ABNORMAL HIGH (ref 96–106)
Creatinine, Ser: 0.98 mg/dL (ref 0.76–1.27)
Glucose: 96 mg/dL (ref 70–99)
Potassium: 4.4 mmol/L (ref 3.5–5.2)
Sodium: 145 mmol/L — ABNORMAL HIGH (ref 134–144)
eGFR: 87 mL/min/{1.73_m2} (ref >59)

## 2023-11-14 LAB — CBC
Hematocrit: 39.7 % (ref 37.5–51.0)
Hemoglobin: 12.8 g/dL — ABNORMAL LOW (ref 13.0–17.7)
MCH: 27.2 pg (ref 26.6–33.0)
MCHC: 32.2 g/dL (ref 31.5–35.7)
MCV: 84 fL (ref 79–97)
Platelets: 225 10*3/uL (ref 150–450)
RBC: 4.71 x10E6/uL (ref 4.14–5.80)
RDW: 13.4 % (ref 11.6–15.4)
WBC: 3.9 10*3/uL (ref 3.4–10.8)

## 2023-11-14 LAB — LIPID PANEL
Chol/HDL Ratio: 1.5 {ratio} (ref 0.0–5.0)
Cholesterol, Total: 102 mg/dL (ref 100–199)
HDL: 68 mg/dL (ref >39)
LDL Chol Calc (NIH): 23 mg/dL (ref 0–99)
Triglycerides: 37 mg/dL (ref 0–149)
VLDL Cholesterol Cal: 11 mg/dL (ref 5–40)

## 2023-11-14 LAB — VITAMIN B12: Vitamin B-12: 625 pg/mL (ref 232–1245)

## 2023-11-18 NOTE — Addendum Note (Signed)
 Addended by: Jonah Blue B on: 11/18/2023 08:35 AM   Modules accepted: Orders

## 2023-11-19 LAB — SPECIMEN STATUS REPORT

## 2023-11-19 LAB — FERRITIN: Ferritin: 122 ng/mL (ref 30–400)

## 2023-11-19 LAB — IRON AND TIBC
Iron Saturation: 29 % (ref 15–55)
Iron: 97 ug/dL (ref 38–169)
Total Iron Binding Capacity: 334 ug/dL (ref 250–450)
UIBC: 237 ug/dL (ref 111–343)

## 2023-11-24 ENCOUNTER — Telehealth: Payer: Self-pay

## 2023-11-24 NOTE — Telephone Encounter (Signed)
Copied from CRM [id number]. Topic: General - Other >> Nov 21, 2023  2:05 PM [place] B wrote: Reason for CRM: pt received call, not sure what its about, I see a note for labs on iron says, needs to be collected. Does pt need to come back for this? Please cb and leave note if this is correct.

## 2023-11-25 NOTE — Telephone Encounter (Signed)
Patient was previously called in regard to lab results. Patient was advised of results on 11/19/2023. Please see lab results for further information.

## 2023-12-10 NOTE — Progress Notes (Deleted)
   S:     No chief complaint on file.  63 y.o. male who presents for hypertension evaluation, education, and management.  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  Patient was referred by Primary Care Provider, Dr. Vicci, on 07/08/2023.   He was last seen my pharmacy clinic on 08/29/2023. In office BP at that time was 134/79. No medication changes were made at that time.  He was last seen by Dr. Vicci on 11/13/2023. In office BP at that time was 148/94. During that visit he was started on Hydralazine  10 mg BID and prescribed a home blood pressure monitor.   Today, patient arrives in *** spirits and presents without *** assistance. *** Denies dizziness, headache, blurred vision, swelling.    Family/Social history:  - Mother (deceased): cancer, HTN - Father (deceased): HTN  Medication adherence *** . Patient has *** taken BP medications today.   Current antihypertensives include:  -Amlodipine  10 mg daily (90 day supply picked up 10/2023) -Hydralazine  10 mg BID (90 day supply picked up 11/14/2023) -Losartan  100 mg every day (90 day supply picked up 11/2023)  Antihypertensives tried in the past include:  -Losartan -hydrochlorothiazide 100-12.5 MG every day (rash with hydrochlorothiazide)  Reported home BP readings: ***  Patient reported dietary habits: Eats *** meals/day Breakfast: *** Lunch: *** Dinner: *** Snacks: *** Drinks: ***  Patient-reported exercise habits: ***  ASCVD risk factors include: ***  O:   Last 3 Office BP readings: BP Readings from Last 3 Encounters:  11/13/23 (!) 148/94  08/29/23 134/79  07/24/23 104/80    BMET    Component Value Date/Time   NA 145 (H) 11/13/2023 0921   K 4.4 11/13/2023 0921   CL 110 (H) 11/13/2023 0921   CO2 19 (L) 11/13/2023 0921   GLUCOSE 96 11/13/2023 0921   GLUCOSE 85 01/03/2023 1126   BUN 15 11/13/2023 0921   CREATININE 0.98 11/13/2023 0921    CALCIUM 9.0 11/13/2023 0921   GFRNONAA 97 03/16/2020 0941   GFRAA 112 03/16/2020 0941    Renal function: CrCl cannot be calculated (Patient's most recent lab result is older than the maximum 21 days allowed.).  Clinical ASCVD: No  The ASCVD Risk score (Arnett DK, et al., 2019) failed to calculate for the following reasons:   The valid total cholesterol range is 130 to 320 mg/dL  Patient is participating in a Managed Medicaid Plan:  Yes    A/P: Hypertension diagnosed *** currently *** on current medications. BP goal < 130/80 *** mmHg. Medication adherence appears ***. Control is suboptimal due to ***.  -{Meds adjust:18428} ***.  -{Meds adjust:18428} ***.  -Patient educated on purpose, proper use, and potential adverse effects of ***.  -F/u labs ordered - *** -Counseled on lifestyle modifications for blood pressure control including reduced dietary sodium, increased exercise, adequate sleep. -Encouraged patient to check BP at home and bring log of readings to next visit. Counseled on proper use of home BP cuff.   Results reviewed and written information provided.    Written patient instructions provided. Patient verbalized understanding of treatment plan.  Total time in face to face counseling *** minutes.    Follow-up:  Pharmacist ***. PCP clinic visit in ***.  Patient seen with ***

## 2023-12-11 ENCOUNTER — Ambulatory Visit: Payer: Medicaid Other | Admitting: Pharmacist

## 2023-12-16 ENCOUNTER — Encounter: Payer: Self-pay | Admitting: Internal Medicine

## 2023-12-16 ENCOUNTER — Ambulatory Visit (INDEPENDENT_AMBULATORY_CARE_PROVIDER_SITE_OTHER): Payer: Medicaid Other | Admitting: Internal Medicine

## 2023-12-16 VITALS — BP 140/90 | HR 82 | Ht 70.0 in | Wt 155.6 lb

## 2023-12-16 DIAGNOSIS — K860 Alcohol-induced chronic pancreatitis: Secondary | ICD-10-CM | POA: Diagnosis not present

## 2023-12-16 DIAGNOSIS — F1091 Alcohol use, unspecified, in remission: Secondary | ICD-10-CM

## 2023-12-16 DIAGNOSIS — K838 Other specified diseases of biliary tract: Secondary | ICD-10-CM

## 2023-12-16 DIAGNOSIS — K8681 Exocrine pancreatic insufficiency: Secondary | ICD-10-CM | POA: Diagnosis not present

## 2023-12-16 DIAGNOSIS — K8689 Other specified diseases of pancreas: Secondary | ICD-10-CM

## 2023-12-16 NOTE — Patient Instructions (Signed)
Please come back and have blood drawn the first week of March. No appointment needed. The lab is open 7:30am-5:15pm.  You will be due a MRI/MRCP in November. We will contact you to set this up.   I appreciate the opportunity to care for you. Stan Head, MD, [facility]

## 2023-12-16 NOTE — Progress Notes (Signed)
Joe Summer Cutshaw Sr. 63 y.o. 07-29-1961 [id number]  Assessment & Plan:   Encounter Diagnoses  Name Primary?   Alcohol-induced chronic pancreatitis (HCC) Yes   Pancreatic insufficiency    Dilated bile duct    Clinically he is doing well on Creon.  He has had some transaminases and mild bilirubin elevation off and on.  Will recheck labs in early March (LFTs) We did review the findings of his MRCP from November 2024. MRCP again around November of this year  Remain abs from alcohol   Subjective:   Gastroenterology summary  Chronic pancreatitis (alcohol) and pancreatic insufficiency treated with Creon  January 2020 admitted to the hospital, pancreatic mass.  This was a pseudoaneurysm treated with gastroduodenal artery embolization  EUS 10/03/2022 (MRCP demonstrated dilated bile duct and increasing pancreatic duct dilation)   EGD impression: - No gross lesions in the proximal esophagus and in the mid esophagus. - Salmon-colored mucosal islands suspicious for short-segment Barrett's esophagus. Biopsied. - Erythematous mucosa in the stomach. Biopsied. - No gross lesions in the duodenal bulb, in the first portion of the duodenum and in the second portion of the duodenum. - Normal major papilla and minor papilla.  EUS impression: - Pancreatic parenchymal abnormalities consisting of atrophy, lobularity and hyperechoicstrands were noted in the entire pancreas. - The pancreatic duct had a dilated endosonographic appearance, had concern for an intraductal stone in the head of pancreas and had a strictured endosonographic appearance within the body of the pancreas. Duct dilation noted throughout (see above). - There was dilation in the common bile duct and in the common hepatic duct. There is gallbladder sludge but no evidence of stone disease within the biliary system - No malignant-appearing lymph nodes were visualized in the celiac region (level 20), peripancreatic region and porta hepatis  region.   A. STOMACH, BIOPSY:  - Gastric antral and oxyntic mucosa with Helicobacter pylori-associated  gastritis  - Helicobacter pylori-like organisms were identified on routine HE  stain   B. ESOPHAGUS, DISTAL, BIOPSY:  - Esophageal squamous and cardiac mucosa with mild chronic nonspecific  carditis  - Negative for intestinal metaplasia or dysplasia    MRCP November 2024 IMPRESSION: 1. Unchanged appearance of the pancreas, which is diffusely atrophic, with extensive cystic change throughout the remaining visible parenchyma, as well as diffuse, severe multi segmental dilatation of the pancreatic duct. There is dense, scarred and contracted appearance of the remaining parenchyma of the pancreatic head, which corresponds to near effacement of the duct at the level of the pancreatic neck. 2. Unchanged dilatation of the common bile duct, which tapers sharply within the central pancreatic head. No choledocholithiasis or obstructing mass. 3. Findings remain consistent with severe, chronic stigmata of pancreatitis. Examinations dated 2020 demonstrate severe acute pancreatitis and a large complicating gastroduodenal artery pseudoaneurysm within the pancreatic head, subsequently embolized. No acute inflammatory findings.   Hepatic steatosis  H. pylori gastritis 2023-treated and eradicated  Normal screening colonoscopy September 2024  ------------------------------------------------------------------------------------------  Chief Complaint: Follow-up chronic pancreatitis  HPI 63 year old man with chronic pancreatitis and GI problems as outlined above presents for follow-up.  He was last seen here for screening colonoscopy in September 2024, he saw Dr. Laural Benes of primary care on November 13, 2023 and his B12 deficiency and hypertension were addressed.  His hemoglobin was in the low normal range at 12.8 with normal MCV.  He has had similar hemoglobins over the years.  Ferritin was 122  iron saturation normal B12 625.  Today he reports that he  is well without significant abdominal pain or stay at a [place] problems.  He is compliant with Creon.  He reports he is abstinent from alcohol.   Wt Readings from Last 3 Encounters:  12/16/23 155 lb 9.6 oz (70.6 kg)  11/13/23 163 lb (73.9 kg)  07/24/23 154 lb (69.9 kg)    Allergies  Allergen Reactions   Hctz [Hydrochlorothiazide] Rash   Current Meds  Medication Sig   amLODipine (NORVASC) 10 MG tablet Take 1 tablet (10 mg total) by mouth daily.   Blood Pressure Monitor KIT Used to monitor blood pressure daily and as directed   cyanocobalamin (VITAMIN B12) 1000 MCG tablet Take 1 tablet (1,000 mcg total) by mouth daily.   hydrALAZINE (APRESOLINE) 10 MG tablet Take 1 tablet (10 mg total) by mouth in the morning and at bedtime.   lipase/protease/amylase (CREON) 36000 UNITS CPEP capsule Take 2 capsules (72,000 Units total) by mouth 3 (three) times daily with meals AND 1 capsule (36,000 Units total) with snacks.   losartan (COZAAR) 100 MG tablet Take 1 tablet (100 mg total) by mouth daily.   Past Medical History:  Diagnosis Date   Chronic alcoholic pancreatitis (HCC)    Diabetes mellitus without complication (HCC)    H. 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 03/11/2019   Past Surgical History:  Procedure Laterality Date   BIOPSY  10/03/2022   Procedure: BIOPSY;  Surgeon: Lemar Lofty., MD;  Location: [facility];  Service: Gastroenterology;;   COLONOSCOPY WITH PROPOFOL N/A 11/08/2018   Procedure: COLONOSCOPY WITH PROPOFOL;  Surgeon: Shellia Cleverly, DO;  Location: [facility];  Service: Gastroenterology;  Laterality: N/A;   ESOPHAGOGASTRODUODENOSCOPY (EGD) WITH PROPOFOL N/A 11/06/2018   Procedure: ESOPHAGOGASTRODUODENOSCOPY (EGD) WITH PROPOFOL;  Surgeon:  Jeani Hawking, MD;  Location: [facility];  Service: Endoscopy;  Laterality: N/A;   ESOPHAGOGASTRODUODENOSCOPY (EGD) WITH PROPOFOL N/A 10/03/2022   Procedure: ESOPHAGOGASTRODUODENOSCOPY (EGD) WITH PROPOFOL;  Surgeon: Meridee Score Netty Starring., MD;  Location: [facility];  Service: Gastroenterology;  Laterality: N/A;   EUS  11/06/2018   Procedure: FULL UPPER ENDOSCOPIC ULTRASOUND (EUS) RADIAL;  Surgeon: Jeani Hawking, MD;  Location: [facility] END[facility];  Service: Endoscopy;;   EUS N/A 10/03/2022   Procedure: UPPER ENDOSCOPIC ULTRASOUND (EUS) RADIAL;  Surgeon: Lemar Lofty., MD;  Location: [facility];  Service: Gastroenterology;  Laterality: N/A;   GIVENS CAPSULE STUDY N/A 11/08/2018   Procedure: GIVENS CAPSULE STUDY;  Surgeon: Shellia Cleverly, DO;  Location: [facility];  Service: Gastroenterology;  Laterality: N/A;   IR ANGIOGRAM SELECTIVE EACH ADDITIONAL VESSEL  11/10/2018   IR ANGIOGRAM VISCERAL SELECTIVE  11/10/2018   IR ANGIOGRAM VISCERAL SELECTIVE  11/10/2018   IR EMBO ART  VEN HEMORR LYMPH EXTRAV  INC GUIDE ROADMAPPING  11/10/2018   IR US GUIDE VASC ACCESS RIGHT  11/10/2018   KNEE SURGERY Bilateral 1985   Arthroscopic for torn cartilage and ligaments.     SHOULDER OPEN ROTATOR CUFF REPAIR Left 05/10/2020   Procedure: LEFT SHOULDER OPEN ROTATOR CUFF REPAIR AND BICEPS TENODESIS;  Surgeon: Tarry Kos, MD;  Location: [facility];  Service: Orthopedics;  Laterality: Left;   Social History   Social History Narrative   Works second shift at [employer] and [employer] with his partner of 30+ years and adult daughter Karolee Stamps.   Denies alcohol use 12/16/2023  No drug   Never smoker      family history includes Cancer in his mother; Hypertension in his father and mother.   Review of Systems As per HPI  Objective:   Physical Exam @BP  (!) 140/90   Pulse 82   Ht 5\' 10"  (1.778 m)   Wt 155 lb 9.6 oz (70.6 kg)   BMI 22.33 kg/m @  General:  NAD Eyes:   anicteric Lungs:   clear Heart::  S1S2 no rubs, murmurs or gallops Abdomen:  soft and nontender, BS+ Ext:   no edema, cyanosis or clubbing    Data Reviewed:  As per HPI

## 2023-12-31 ENCOUNTER — Other Ambulatory Visit: Payer: Self-pay

## 2024-01-08 ENCOUNTER — Other Ambulatory Visit: Payer: Self-pay

## 2024-01-08 ENCOUNTER — Encounter: Payer: Self-pay | Admitting: Pharmacist

## 2024-01-08 ENCOUNTER — Ambulatory Visit: Payer: Medicaid Other | Attending: Internal Medicine | Admitting: Pharmacist

## 2024-01-08 VITALS — BP 127/82 | HR 78

## 2024-01-08 DIAGNOSIS — K529 Noninfective gastroenteritis and colitis, unspecified: Secondary | ICD-10-CM | POA: Insufficient documentation

## 2024-01-08 DIAGNOSIS — I10 Essential (primary) hypertension: Secondary | ICD-10-CM

## 2024-01-08 DIAGNOSIS — M5442 Lumbago with sciatica, left side: Secondary | ICD-10-CM | POA: Diagnosis present

## 2024-01-08 DIAGNOSIS — D649 Anemia, unspecified: Secondary | ICD-10-CM | POA: Insufficient documentation

## 2024-01-08 DIAGNOSIS — G8929 Other chronic pain: Secondary | ICD-10-CM | POA: Insufficient documentation

## 2024-01-08 DIAGNOSIS — Z79899 Other long term (current) drug therapy: Secondary | ICD-10-CM | POA: Insufficient documentation

## 2024-01-08 NOTE — Progress Notes (Signed)
   S:     No chief complaint on file.  63 y.o. male who presents for hypertension evaluation, education, and management.  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   Patient was referred and last seen by Primary Care Provider, Dr. Laural Benes, on 11/13/2023. In office BP at that time was 148/94. During that visit he was started on Hydralazine 10 mg BID and prescribed a home blood pressure monitor.   Today, patient arrives in good spirits and presents without assistance. Denies dizziness, headache, blurred vision, swelling.   Family/Social history:  - Mother (deceased): cancer, HTN - Father (deceased): HTN  Medication adherence reported. Patient has taken BP medications today.   Current antihypertensives include:  -Amlodipine 10 mg daily (90 day supply picked up 10/2023) -Hydralazine 10 mg BID (90 day supply picked up 11/14/2023) -Losartan 100 mg every day (90 day supply picked up 11/2023)  Antihypertensives tried in the past include:  -Losartan-hydrochlorothiazide 100-12.5 MG every day (rash with hydrochlorothiazide)  Reported home BP readings:  -Not checking  Patient reported dietary habits: -Compliant with sodium restriction -Denies drinking excessive caffeine   Patient-reported exercise habits:  -Walks daily - 30 minutes  O:  Vitals:   01/08/24 0950  BP: 127/82  Pulse: 78   Last 3 Office BP readings: BP Readings from Last 3 Encounters:  01/08/24 127/82  12/16/23 (!) 140/90  11/13/23 (!) 148/94   BMET    Component Value Date/Time   NA 145 (H) 11/13/2023 0921   K 4.4 11/13/2023 0921   CL 110 (H) 11/13/2023 0921   CO2 19 (L) 11/13/2023 0921   GLUCOSE 96 11/13/2023 0921   GLUCOSE 85 01/03/2023 1126   BUN 15 11/13/2023 0921   CREATININE 0.98 11/13/2023 0921   CALCIUM 9.0 11/13/2023 0921   GFRNONAA 97 03/16/2020 0941   GFRAA 112 03/16/2020 0941   Renal function: CrCl cannot be calculated (Patient's  most recent lab result is older than the maximum 21 days allowed.).  Clinical ASCVD: No  The ASCVD Risk score (Arnett DK, et al., 2019) failed to calculate for the following reasons:   The valid total cholesterol range is 130 to 320 mg/dL  Patient is participating in a Managed Medicaid Plan:  Yes    A/P: Hypertension longstanding currently at goal on current medications. BP goal < 130/80 mmHg. Medication adherence appears appropriate.  -Continued amlodipine, losartan, and hydralazine at current doses.     -F/u labs ordered - none -Counseled on lifestyle modifications for blood pressure control including reduced dietary sodium, increased exercise, adequate sleep. -Encouraged patient to check BP at home and bring log of readings to next visit. Counseled on proper use of home BP cuff.   Results reviewed and written information provided.    Written patient instructions provided. Patient verbalized understanding of treatment plan.  Total time in face to face counseling 20 minutes.    Follow-up:  Pharmacist prn. PCP clinic visit in 03/12/2024.   Butch Penny, PharmD, P[place]y [place], CPP Clinical Pharmacist [facility] [phone]

## 2024-01-09 ENCOUNTER — Other Ambulatory Visit: Payer: Self-pay

## 2024-02-11 ENCOUNTER — Other Ambulatory Visit: Payer: Self-pay

## 2024-02-12 ENCOUNTER — Other Ambulatory Visit: Payer: Self-pay

## 2024-03-12 ENCOUNTER — Ambulatory Visit: Payer: Self-pay | Attending: Internal Medicine | Admitting: Internal Medicine

## 2024-03-17 ENCOUNTER — Telehealth: Payer: Self-pay

## 2024-03-17 DIAGNOSIS — R748 Abnormal levels of other serum enzymes: Secondary | ICD-10-CM

## 2024-03-17 NOTE — Telephone Encounter (Signed)
 I left him a detailed voice mail message to please come get his labs drawn. I included the days/hours of the lab.

## 2024-03-30 NOTE — Telephone Encounter (Signed)
 Left Scott another detailed message to please come get his labs drawn.

## 2024-04-02 ENCOUNTER — Other Ambulatory Visit (INDEPENDENT_AMBULATORY_CARE_PROVIDER_SITE_OTHER)

## 2024-04-02 ENCOUNTER — Ambulatory Visit: Payer: Self-pay | Admitting: Internal Medicine

## 2024-04-02 ENCOUNTER — Other Ambulatory Visit: Payer: Self-pay | Admitting: Internal Medicine

## 2024-04-02 DIAGNOSIS — K838 Other specified diseases of biliary tract: Secondary | ICD-10-CM

## 2024-04-02 DIAGNOSIS — R748 Abnormal levels of other serum enzymes: Secondary | ICD-10-CM

## 2024-04-02 LAB — HEPATIC FUNCTION PANEL
ALT: 45 U/L (ref 0–53)
AST: 54 U/L — ABNORMAL HIGH (ref 0–37)
Albumin: 4.2 g/dL (ref 3.5–5.2)
Alkaline Phosphatase: 78 U/L (ref 39–117)
Bilirubin, Direct: 0.4 mg/dL — ABNORMAL HIGH (ref 0.0–0.3)
Total Bilirubin: 1.6 mg/dL — ABNORMAL HIGH (ref 0.2–1.2)
Total Protein: 6.7 g/dL (ref 6.0–8.3)

## 2024-04-05 NOTE — Telephone Encounter (Signed)
 He came and had labs drawn 04/02/2024.

## 2024-04-07 ENCOUNTER — Other Ambulatory Visit: Payer: Self-pay | Admitting: Internal Medicine

## 2024-04-07 ENCOUNTER — Other Ambulatory Visit: Payer: Self-pay

## 2024-04-07 DIAGNOSIS — K861 Other chronic pancreatitis: Secondary | ICD-10-CM

## 2024-04-08 ENCOUNTER — Other Ambulatory Visit: Payer: Self-pay

## 2024-04-09 ENCOUNTER — Other Ambulatory Visit: Payer: Self-pay

## 2024-04-16 ENCOUNTER — Other Ambulatory Visit: Payer: Self-pay

## 2024-04-26 ENCOUNTER — Other Ambulatory Visit: Payer: Self-pay | Admitting: Internal Medicine

## 2024-04-26 DIAGNOSIS — K861 Other chronic pancreatitis: Secondary | ICD-10-CM

## 2024-04-26 NOTE — Telephone Encounter (Unsigned)
 Copied from CRM [id number]. Topic: Clinical - Medication Refill >> Apr 26, 2024  1:16 PM Nathanel BROCKS wrote: Medication: lipase/protease/amylase (CREON ) 36000 UNITS CPEP capsule  Has the patient contacted their pharmacy? Yes   This is the patient's preferred pharmacy:   [facility] [address], [address] [place] [address] Phone: [phone] Fax: [phone]  Is this the correct pharmacy for this prescription? Yes If no, delete pharmacy and type the correct one.   Has the prescription been filled recently? Yes  Is the patient out of the medication? Yes  Has the patient been seen for an appointment in the last year OR does the patient have an upcoming appointment? Yes  Can we respond through MyChart? No  Agent: Please be advised that Rx refills may take up to 3 business days. We ask that you follow-up with your pharmacy.

## 2024-04-28 ENCOUNTER — Telehealth: Payer: Self-pay | Admitting: Internal Medicine

## 2024-04-28 ENCOUNTER — Other Ambulatory Visit: Payer: Self-pay

## 2024-04-28 DIAGNOSIS — K861 Other chronic pancreatitis: Secondary | ICD-10-CM

## 2024-04-28 MED ORDER — PANCRELIPASE (LIP-PROT-AMYL) 36000-114000 UNITS PO CPEP
ORAL_CAPSULE | ORAL | 5 refills | Status: AC
  Filled 2024-04-28: qty 200, 29d supply, fill #0
  Filled 2024-06-24: qty 200, 29d supply, fill #1
  Filled 2024-08-24: qty 200, 29d supply, fill #2
  Filled 2024-10-21: qty 200, 29d supply, fill #3

## 2024-04-28 MED ORDER — PANCRELIPASE (LIP-PROT-AMYL) 36000-114000 UNITS PO CPEP
ORAL_CAPSULE | ORAL | 5 refills | Status: DC
  Filled 2024-04-28: qty 200, 29d supply, fill #0

## 2024-04-28 NOTE — Telephone Encounter (Signed)
 Creon  refilled as requested. Left him a voice mail message that it was sent to [facility]. He is up to date on office visits.

## 2024-04-28 NOTE — Telephone Encounter (Signed)
 Requesting medication refill for creon . Please advise.

## 2024-04-28 NOTE — Telephone Encounter (Signed)
 Requested medication (s) are due for refill today: na   Requested medication (s) are on the active medication list: yes   Last refill:  04/07/24 #270 1 refills  Future visit scheduled: no   Notes to clinic:  medication not assigned to a protocol.  last ordered by Lupita Commander, MD 04/07/24. Do you want to refill Rx?     Requested Prescriptions  Pending Prescriptions Disp Refills   lipase/protease/amylase (CREON ) 36000 UNITS CPEP capsule 240 capsule 1     Off-Protocol Failed - 04/28/2024 10:44 AM      Failed - Medication not assigned to a protocol, review manually.      Passed - Valid encounter within last 12 months    Recent Outpatient Visits           3 months ago Primary hypertension   [facility] [place]. [facility] Fleeta Morris, [place] L, RPH-CPP   5 months ago Vitamin B12 deficiency   [facility] [place]. [facility] Vicci Barnie NOVAK, MD   8 months ago Essential hypertension   [facility] [place]. [facility] Fleeta Morris Garnette LITTIE, RPH-CPP   9 months ago Essential hypertension   [facility] [place] [facility] Of [place]. [facility] Vicci Barnie NOVAK, MD   1 year ago Essential hypertension   [facility] Of [place]. [facility] Vicci Barnie NOVAK, MD

## 2024-04-29 ENCOUNTER — Other Ambulatory Visit: Payer: Self-pay

## 2024-05-03 ENCOUNTER — Other Ambulatory Visit: Payer: Self-pay

## 2024-05-19 ENCOUNTER — Other Ambulatory Visit (INDEPENDENT_AMBULATORY_CARE_PROVIDER_SITE_OTHER)

## 2024-05-19 DIAGNOSIS — K838 Other specified diseases of biliary tract: Secondary | ICD-10-CM

## 2024-05-19 LAB — HEPATIC FUNCTION PANEL
ALT: 34 U/L (ref 0–53)
AST: 48 U/L — ABNORMAL HIGH (ref 0–37)
Albumin: 4.3 g/dL (ref 3.5–5.2)
Alkaline Phosphatase: 77 U/L (ref 39–117)
Bilirubin, Direct: 0.2 mg/dL (ref 0.0–0.3)
Total Bilirubin: 1.1 mg/dL (ref 0.2–1.2)
Total Protein: 7 g/dL (ref 6.0–8.3)

## 2024-05-20 ENCOUNTER — Ambulatory Visit: Payer: Self-pay | Admitting: Internal Medicine

## 2024-05-20 ENCOUNTER — Other Ambulatory Visit: Payer: Self-pay

## 2024-05-20 DIAGNOSIS — R7989 Other specified abnormal findings of blood chemistry: Secondary | ICD-10-CM

## 2024-06-25 ENCOUNTER — Other Ambulatory Visit: Payer: Self-pay

## 2024-08-06 ENCOUNTER — Telehealth: Payer: Self-pay

## 2024-08-06 DIAGNOSIS — K838 Other specified diseases of biliary tract: Secondary | ICD-10-CM

## 2024-08-06 DIAGNOSIS — K861 Other chronic pancreatitis: Secondary | ICD-10-CM

## 2024-08-06 NOTE — Telephone Encounter (Signed)
 I have left him a detailed message to call me back with days that are good for him to do his imaging.

## 2024-08-06 NOTE — Telephone Encounter (Signed)
-----   Message from [facility] J sent at 12/16/2023 10:36 AM EST ----- Due for MR ABD MRCP w wo contrast in November for chronic pancreatitis, dilated bile duct

## 2024-08-17 NOTE — Telephone Encounter (Signed)
 I left Joe [name] another detailed message to call me back.

## 2024-08-17 NOTE — Telephone Encounter (Signed)
 Patient returned phone call. States he is currently at work and will call back tomorrow. Please advise, thank you

## 2024-08-18 NOTE — Telephone Encounter (Signed)
 Scott called back and he gave me the best day/time for him. I have set his MRI/MRCP up at [facility] for Friday November 14th , arrive thru the ED entrance at 6:30AM for a 7:00AM scan. NPO 4 hours. I left him a detailed message with this information as he requested. He is at work. I also left him the # to R/S if needed, # [phone].

## 2024-08-24 ENCOUNTER — Other Ambulatory Visit: Payer: Self-pay

## 2024-08-26 ENCOUNTER — Other Ambulatory Visit: Payer: Self-pay

## 2024-09-17 ENCOUNTER — Other Ambulatory Visit (HOSPITAL_COMMUNITY): Payer: Self-pay | Admitting: Internal Medicine

## 2024-09-17 ENCOUNTER — Ambulatory Visit (HOSPITAL_COMMUNITY)
Admission: RE | Admit: 2024-09-17 | Discharge: 2024-09-17 | Disposition: A | Discharge status: Home / Self Care | Source: Ambulatory Visit | Attending: Internal Medicine | Admitting: Internal Medicine

## 2024-09-17 DIAGNOSIS — K861 Other chronic pancreatitis: Secondary | ICD-10-CM | POA: Insufficient documentation

## 2024-09-17 DIAGNOSIS — K838 Other specified diseases of biliary tract: Secondary | ICD-10-CM | POA: Diagnosis present

## 2024-09-17 DIAGNOSIS — R52 Pain, unspecified: Secondary | ICD-10-CM | POA: Insufficient documentation

## 2024-09-17 MED ORDER — GADOBUTROL 1 MMOL/ML IV SOLN
7.0000 mL | Freq: Once | INTRAVENOUS | Status: AC | PRN
  Administered 2024-09-17: 7 mL via INTRAVENOUS

## 2024-09-20 ENCOUNTER — Ambulatory Visit: Payer: Self-pay | Admitting: Internal Medicine

## 2024-09-23 ENCOUNTER — Other Ambulatory Visit: Payer: Self-pay | Admitting: Internal Medicine

## 2024-09-23 DIAGNOSIS — E538 Deficiency of other specified B group vitamins: Secondary | ICD-10-CM

## 2024-10-14 ENCOUNTER — Other Ambulatory Visit: Payer: Self-pay

## 2024-10-19 ENCOUNTER — Other Ambulatory Visit: Payer: Self-pay | Admitting: Internal Medicine

## 2024-10-19 DIAGNOSIS — K838 Other specified diseases of biliary tract: Secondary | ICD-10-CM

## 2024-10-19 DIAGNOSIS — K861 Other chronic pancreatitis: Secondary | ICD-10-CM

## 2024-10-21 ENCOUNTER — Other Ambulatory Visit: Payer: Self-pay | Admitting: Internal Medicine

## 2024-10-21 ENCOUNTER — Other Ambulatory Visit: Payer: Self-pay

## 2024-10-21 DIAGNOSIS — I10 Essential (primary) hypertension: Secondary | ICD-10-CM

## 2024-10-21 MED ORDER — HYDRALAZINE HCL 10 MG PO TABS
10.0000 mg | ORAL_TABLET | Freq: Two times a day (BID) | ORAL | 0 refills | Status: AC
  Filled 2024-10-21: qty 60, 30d supply, fill #0

## 2024-11-02 ENCOUNTER — Other Ambulatory Visit: Payer: Self-pay

## 2024-12-08 ENCOUNTER — Ambulatory Visit: Admitting: Internal Medicine

## 2025-01-28 ENCOUNTER — Ambulatory Visit: Admitting: Internal Medicine
